# Patient Record
Sex: Male | Born: 1975 | Race: Black or African American | Hispanic: No
Health system: Southern US, Academic
[De-identification: ages and names within clinical notes are randomized; demographics above are authoritative.]

## PROBLEM LIST (undated history)

## (undated) ENCOUNTER — Telehealth

## (undated) ENCOUNTER — Ambulatory Visit

## (undated) ENCOUNTER — Encounter

## (undated) ENCOUNTER — Ambulatory Visit: Payer: MEDICARE | Attending: Surgery | Primary: Surgery

## (undated) ENCOUNTER — Ambulatory Visit: Payer: MEDICARE

## (undated) ENCOUNTER — Encounter: Attending: Nephrology | Primary: Nephrology

## (undated) ENCOUNTER — Ambulatory Visit: Payer: MEDICARE | Attending: Nephrology | Primary: Nephrology

## (undated) ENCOUNTER — Telehealth: Attending: Nephrology | Primary: Nephrology

## (undated) ENCOUNTER — Ambulatory Visit: Attending: Nephrology | Primary: Nephrology

## (undated) ENCOUNTER — Inpatient Hospital Stay

## (undated) ENCOUNTER — Telehealth: Attending: Cardiovascular Disease | Primary: Cardiovascular Disease

## (undated) DIAGNOSIS — I10 Essential (primary) hypertension: Secondary | ICD-10-CM

## (undated) DIAGNOSIS — N289 Disorder of kidney and ureter, unspecified: Secondary | ICD-10-CM

## (undated) DIAGNOSIS — E785 Hyperlipidemia, unspecified: Secondary | ICD-10-CM

## (undated) DIAGNOSIS — F32A Depression, unspecified: Secondary | ICD-10-CM

## (undated) DIAGNOSIS — K219 Gastro-esophageal reflux disease without esophagitis: Secondary | ICD-10-CM

## (undated) DIAGNOSIS — J45909 Unspecified asthma, uncomplicated: Secondary | ICD-10-CM

## (undated) DIAGNOSIS — Z8739 Personal history of other diseases of the musculoskeletal system and connective tissue: Secondary | ICD-10-CM

## (undated) DIAGNOSIS — F329 Major depressive disorder, single episode, unspecified: Secondary | ICD-10-CM

## (undated) DIAGNOSIS — N19 Unspecified kidney failure: Secondary | ICD-10-CM

## (undated) DIAGNOSIS — I4891 Unspecified atrial fibrillation: Secondary | ICD-10-CM

## (undated) HISTORY — DX: Hyperlipidemia, unspecified: E78.5

## (undated) HISTORY — DX: Major depressive disorder, single episode, unspecified: F32.9

## (undated) HISTORY — DX: Gastro-esophageal reflux disease without esophagitis: K21.9

## (undated) HISTORY — DX: Unspecified kidney failure: N19

## (undated) HISTORY — DX: Disorder of kidney and ureter, unspecified: N28.9

## (undated) HISTORY — DX: Essential (primary) hypertension: I10

## (undated) HISTORY — DX: Depression, unspecified: F32.A

## (undated) HISTORY — DX: Unspecified asthma, uncomplicated: J45.909

## (undated) HISTORY — DX: Personal history of other diseases of the musculoskeletal system and connective tissue: Z87.39

---
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
## 1999-12-12 ENCOUNTER — Emergency Department (HOSPITAL_COMMUNITY): Admission: EM | Admit: 1999-12-12 | Discharge: 1999-12-12 | Payer: Self-pay | Admitting: Emergency Medicine

## 2000-02-21 ENCOUNTER — Emergency Department (HOSPITAL_COMMUNITY): Admission: EM | Admit: 2000-02-21 | Discharge: 2000-02-21 | Payer: Self-pay | Admitting: *Deleted

## 2005-11-09 ENCOUNTER — Ambulatory Visit: Payer: Self-pay | Admitting: Internal Medicine

## 2007-04-10 ENCOUNTER — Emergency Department: Payer: Self-pay | Admitting: Unknown Physician Specialty

## 2008-09-20 HISTORY — PX: DIALYSIS FISTULA CREATION: SHX611

## 2008-11-24 ENCOUNTER — Emergency Department: Payer: Self-pay | Admitting: Emergency Medicine

## 2008-11-25 ENCOUNTER — Emergency Department: Payer: Self-pay | Admitting: Emergency Medicine

## 2008-11-27 ENCOUNTER — Emergency Department: Payer: Self-pay | Admitting: Emergency Medicine

## 2008-11-29 ENCOUNTER — Ambulatory Visit: Payer: Self-pay

## 2008-12-20 ENCOUNTER — Emergency Department: Payer: Self-pay | Admitting: Emergency Medicine

## 2011-07-14 ENCOUNTER — Other Ambulatory Visit: Payer: Self-pay | Admitting: Nephrology

## 2012-04-20 ENCOUNTER — Ambulatory Visit: Payer: Self-pay | Admitting: Internal Medicine

## 2012-04-20 LAB — TSH: Thyroid Stimulating Horm: 0.654 u[IU]/mL

## 2012-04-21 ENCOUNTER — Other Ambulatory Visit: Payer: Self-pay | Admitting: Internal Medicine

## 2012-04-24 LAB — CULTURE, BLOOD (SINGLE)

## 2012-04-25 LAB — CULTURE, BLOOD (SINGLE)

## 2012-05-04 ENCOUNTER — Ambulatory Visit: Payer: Self-pay | Admitting: Vascular Surgery

## 2012-10-26 ENCOUNTER — Ambulatory Visit: Payer: Self-pay | Admitting: Surgery

## 2012-10-26 LAB — BASIC METABOLIC PANEL
Chloride: 96 mmol/L — ABNORMAL LOW (ref 98–107)
Creatinine: 11.62 mg/dL — ABNORMAL HIGH (ref 0.60–1.30)
EGFR (African American): 6 — ABNORMAL LOW
Osmolality: 282 (ref 275–301)
Potassium: 4.5 mmol/L (ref 3.5–5.1)

## 2012-10-26 LAB — CBC
HGB: 13.8 g/dL (ref 13.0–18.0)
MCHC: 32.7 g/dL (ref 32.0–36.0)
MCV: 90 fL (ref 80–100)
RBC: 4.7 10*6/uL (ref 4.40–5.90)
RDW: 15.1 % — ABNORMAL HIGH (ref 11.5–14.5)
WBC: 6.8 10*3/uL (ref 3.8–10.6)

## 2012-10-31 ENCOUNTER — Ambulatory Visit: Payer: Self-pay | Admitting: Surgery

## 2012-11-01 LAB — PATHOLOGY REPORT

## 2012-12-12 ENCOUNTER — Emergency Department: Payer: Self-pay | Admitting: Emergency Medicine

## 2012-12-13 LAB — CBC
HGB: 12.4 g/dL — ABNORMAL LOW (ref 13.0–18.0)
MCH: 28.2 pg (ref 26.0–34.0)
MCHC: 32.1 g/dL (ref 32.0–36.0)
Platelet: 257 10*3/uL (ref 150–440)
RBC: 4.4 10*6/uL (ref 4.40–5.90)
WBC: 11.6 10*3/uL — ABNORMAL HIGH (ref 3.8–10.6)

## 2012-12-13 LAB — CK TOTAL AND CKMB (NOT AT ARMC): CK-MB: <0.5 ng/mL — ABNORMAL LOW (ref 0.5–3.6)

## 2012-12-13 LAB — BASIC METABOLIC PANEL
Anion Gap: 10 (ref 7–16)
Chloride: 94 mmol/L — ABNORMAL LOW (ref 98–107)
Co2: 26 mmol/L (ref 21–32)
Creatinine: 14.49 mg/dL — ABNORMAL HIGH (ref 0.60–1.30)
Glucose: 135 mg/dL — ABNORMAL HIGH (ref 65–99)
Osmolality: 282 (ref 275–301)
Potassium: 4.9 mmol/L (ref 3.5–5.1)
Sodium: 130 mmol/L — ABNORMAL LOW (ref 136–145)

## 2012-12-13 LAB — TROPONIN I: Troponin-I: <0.02 ng/mL

## 2012-12-18 ENCOUNTER — Ambulatory Visit (INDEPENDENT_AMBULATORY_CARE_PROVIDER_SITE_OTHER): Payer: Medicare Other | Admitting: Cardiovascular Disease

## 2012-12-18 ENCOUNTER — Encounter: Payer: Self-pay | Admitting: Cardiovascular Disease

## 2012-12-18 VITALS — BP 102/74 | HR 89 | Ht 73.0 in | Wt 219.2 lb

## 2012-12-18 DIAGNOSIS — R0602 Shortness of breath: Secondary | ICD-10-CM

## 2012-12-18 DIAGNOSIS — R002 Palpitations: Secondary | ICD-10-CM

## 2012-12-18 DIAGNOSIS — R079 Chest pain, unspecified: Secondary | ICD-10-CM

## 2012-12-18 NOTE — Assessment & Plan Note (Signed)
Shortness of breath is only happening in the setting of palpitations and reported tachycardia. He is not having any exertional symptoms. I will request his previous cardiac records. If his echocardiogram was more than 6 months ago, we might need to proceed with a repeat echocardiogram to make sure he has no pericardial effusion or other etiologies given that he is on dialysis.

## 2012-12-18 NOTE — Progress Notes (Signed)
HPI  This is a pleasant 37 year old African American male who was referred from the emergency room at [facility] for evaluation of palpitations and dyspnea. The patient is not aware of any previous cardiac history. He has known history of end-stage renal disease on hemodialysis on Monday Wednesday Friday. 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 He is currently on the kidney transplant list. He reports having previous cardiac workup including stress testing which has been unremarkable. His other medical problems include hyperlipidemia and tobacco use. He reports having bacteremia about 6 months ago which was treated with antibiotics. He saw Dr. Gwen Pounds for evaluation of possible endocarditis. He reports having an echocardiogram done at that time and was overall unremarkable. These records are not available. He also reports history of gout. He has been having intermittent episodes of palpitations and tachycardia associated with dyspnea. He had a prolonged episode last week and call EMS. However, by the time they arrived, he was feeling back to his normal self. He was evaluated in the emergency room and was noted to be in normal sinus rhythm. His labs were overall unremarkable. Cardiac enzymes were negative. Chest x-ray was normal. EKG was unremarkable. He has not had any recurrent palpitations since then. He exercises in a regular basis and has not experienced any recent chest pain or significant exertional dyspnea. His dyspnea seems to happen mainly when he feels fast heartbeats. He has been more stressed recently as he has a newborn at home.  No Known Allergies   No current outpatient prescriptions on file prior to visit.   No current facility-administered medications on file prior to visit.     Past Medical History  Diagnosis Date  . Kidney failure   . Depression   . Hyperlipidemia   . GERD (gastroesophageal reflux disease)   . Hx of gout   . Asthma   . Renal insufficiency   .  Hypertension      Past Surgical History  Procedure Laterality Date  . Dialysis fistula creation       Family History  Problem Relation Age of Onset  . Hypertension Father   . Hyperlipidemia Father   . Heart disease Father     CABG     History   Social History  . Marital Status: Single    Spouse Name: N/A    Number of Children: N/A  . Years of Education: N/A   Occupational History  . Not on file.   Social History Main Topics  . Smoking status: Current Some Day Smoker -- 0.50 packs/day for 7 years    Types: Cigarettes  . Smokeless tobacco: Not on file  . Alcohol Use: No  . Drug Use: Not on file     Comment: Smoked marijuana for about 10-12 years; quit x 5 years.  . Sexually Active: Not on file   Other Topics Concern  . Not on file   Social History Narrative  . No narrative on file     ROS Constitutional: Negative for fever, chills, diaphoresis, activity change, appetite change and fatigue.  HENT: Negative for hearing loss, nosebleeds, congestion, sore throat, facial swelling, drooling, trouble swallowing, neck pain, voice change, sinus pressure and tinnitus.  Eyes: Negative for photophobia, pain, discharge and visual disturbance.  Respiratory: Negative for apnea, cough, chest tightness and wheezing.  Cardiovascular: Negative for chest pain and leg swelling.  Gastrointestinal: Negative for nausea, vomiting, abdominal pain, diarrhea, constipation, blood in stool and abdominal distention.  Genitourinary: Negative for dysuria,  urgency, frequency, hematuria and decreased urine volume.  Musculoskeletal: Negative for myalgias, back pain, joint swelling, arthralgias and gait problem.  Skin: Negative for color change, pallor, rash and wound.  Neurological: Negative for dizziness, tremors, seizures, syncope, speech difficulty, weakness, light-headedness, numbness and headaches.  Psychiatric/Behavioral: Negative for suicidal ideas, hallucinations, behavioral problems and  agitation. The patient is not nervous/anxious.     PHYSICAL EXAM   BP 102/74  Pulse 89  Ht 6\' 1"  (1.854 m)  Wt 219 lb 4 oz (99.451 kg)  BMI 28.93 kg/m2 Constitutional: He is oriented to person, place, and time. He appears well-developed and well-nourished. No distress.  HENT: No nasal discharge.  Head: Normocephalic and atraumatic.  Eyes: Pupils are equal and round. Right eye exhibits no discharge. Left eye exhibits no discharge.  Neck: Normal range of motion. Neck supple. No JVD present. No thyromegaly present.  Cardiovascular: Normal rate, regular rhythm, normal heart sounds and. Exam reveals no gallop and no friction rub. No murmur heard.  Pulmonary/Chest: Effort normal and breath sounds normal. No stridor. No respiratory distress. He has no wheezes. He has no rales. He exhibits no tenderness.  Abdominal: Soft. Bowel sounds are normal. He exhibits no distension. There is no tenderness. There is no rebound and no guarding.  Musculoskeletal: Normal range of motion. He exhibits no edema and no tenderness.  Neurological: He is alert and oriented to person, place, and time. Coordination normal.  Skin: Skin is warm and dry. No rash noted. He is not diaphoretic. No erythema. No pallor.  Psychiatric: He has a normal mood and affect. His behavior is normal. Judgment and thought content normal.       EKG: Sinus  Rhythm  WITHIN NORMAL LIMITS   ASSESSMENT AND PLAN

## 2012-12-18 NOTE — Patient Instructions (Addendum)
Your physician has recommended that you wear an event monitor. Event monitors are medical devices that record the heart's electrical activity. Doctors most often [place] these monitors to diagnose arrhythmias. Arrhythmias are problems with the speed or rhythm of the heartbeat. The monitor is a small, portable device. You can wear one while you do your normal daily activities. This is usually used to diagnose what is causing palpitations/syncope (passing out).  We need cardiac records.   Follow up after monitor.

## 2012-12-18 NOTE — Assessment & Plan Note (Signed)
He is having intermittent palpitations which seems to be suggestive of tachyarrhythmia. However, this has not been captured by testing so for. His symptoms are not happening frequently enough to be captured with a Holter monitor. Thus, I recommend a 30 day outpatient telemetry.

## 2013-01-30 ENCOUNTER — Encounter: Payer: Self-pay | Admitting: Cardiovascular Disease

## 2013-01-30 ENCOUNTER — Ambulatory Visit (INDEPENDENT_AMBULATORY_CARE_PROVIDER_SITE_OTHER): Payer: Medicare Other | Admitting: Cardiovascular Disease

## 2013-01-30 VITALS — BP 122/82 | HR 88 | Ht 74.0 in | Wt 229.5 lb

## 2013-01-30 DIAGNOSIS — R000 Tachycardia, unspecified: Secondary | ICD-10-CM

## 2013-01-30 DIAGNOSIS — R002 Palpitations: Secondary | ICD-10-CM

## 2013-01-30 DIAGNOSIS — R0602 Shortness of breath: Secondary | ICD-10-CM

## 2013-01-30 NOTE — Progress Notes (Signed)
HPI  This is a pleasant 37 year old [place] American male who is here today for a followup visit regarding  palpitations and dyspnea. He has known history of end-stage renal disease on hemodialysis on Monday Wednesday Friday. 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 He is currently on the kidney transplant list.  He was seen for  intermittent episodes of palpitations and tachycardia associated with dyspnea. Basic workup in the emergency room was unremarkable. He underwent an outpatient telemetry which showed no evidence of arrhythmia other than sinus tachycardia. His symptoms overall improved. He denies any other symptoms. His blood pressure tends to run low especially on the day of dialysis.    No Known Allergies   Current Outpatient Prescriptions on File Prior to Visit  Medication Sig Dispense Refill  . albuterol (PROVENTIL) (2.5 MG/3ML) 0.083% nebulizer solution Take 2.5 mg by nebulization every 6 (six) hours as needed for wheezing.      Marland Kitchen aspirin 81 MG tablet Take 81 mg by mouth daily.      Marland Kitchen atorvastatin (LIPITOR) 20 MG tablet Take 20 mg by mouth daily.      . Cholecalciferol (VITAMIN D) 1000 UNITS capsule Take 1,000 Units by mouth daily.      . colchicine (COLCRYS) 0.6 MG tablet Take 0.6 mg by mouth daily.      Marland Kitchen esomeprazole (NEXIUM) 40 MG capsule Take 40 mg by mouth daily before breakfast.      . Omega-3 Fatty Acids (FISH OIL) 1000 MG CAPS Take 1,000 mg by mouth daily.      . sertraline (ZOLOFT) 25 MG tablet Take 25 mg by mouth daily.      . vitamin E 400 UNIT capsule Take 400 Units by mouth daily.       No current facility-administered medications on file prior to visit.     Past Medical History  Diagnosis Date  . Kidney failure   . Depression   . Hyperlipidemia   . GERD (gastroesophageal reflux disease)   . Hx of gout   . Asthma   . Renal insufficiency   . Hypertension      Past Surgical History  Procedure Laterality Date  . Dialysis fistula creation        Family History  Problem Relation Age of Onset  . Hypertension Father   . Hyperlipidemia Father   . Heart disease Father     CABG     History   Social History  . Marital Status: Single    Spouse Name: N/A    Number of Children: N/A  . Years of Education: N/A   Occupational History  . Not on file.   Social History Main Topics  . Smoking status: Current Some Day Smoker -- 0.50 packs/day for 7 years    Types: Cigarettes  . Smokeless tobacco: Not on file  . Alcohol Use: No  . Drug Use: Not on file     Comment: Smoked marijuana for about 10-12 years; quit x 5 years.  . Sexually Active: Not on file   Other Topics Concern  . Not on file   Social History Narrative  . No narrative on file     PHYSICAL EXAM   BP 122/82  Pulse 88  Ht 6\' 2"  (1.88 m)  Wt 229 lb 8 oz (104.101 kg)  BMI 29.45 kg/m2 Constitutional: He is oriented to person, place, and time. He appears well-developed and well-nourished. No distress.  HENT: No nasal discharge.  Head: Normocephalic and atraumatic.  Eyes: Pupils are equal and round. Right eye exhibits no discharge. Left eye exhibits no discharge.  Neck: Normal range of motion. Neck supple. No JVD present. No thyromegaly present.  Cardiovascular: Normal rate, regular rhythm, normal heart sounds and. Exam reveals no gallop and no friction rub. No murmur heard.  Pulmonary/Chest: Effort normal and breath sounds normal. No stridor. No respiratory distress. He has no wheezes. He has no rales. He exhibits no tenderness.  Abdominal: Soft. Bowel sounds are normal. He exhibits no distension. There is no tenderness. There is no rebound and no guarding.  Musculoskeletal: Normal range of motion. He exhibits no edema and no tenderness.  Neurological: He is alert and oriented to person, place, and time. Coordination normal.  Skin: Skin is warm and dry. No rash noted. He is not diaphoretic. No erythema. No pallor.  Psychiatric: He has a normal mood and  affect. His behavior is normal. Judgment and thought content normal.       EKG: Sinus  Rhythm  WITHIN NORMAL LIMITS   ASSESSMENT AND PLAN

## 2013-01-30 NOTE — Assessment & Plan Note (Signed)
Outpatient telemetry showed only sinus tachycardia with no evidence of arrhythmia. Continue observation for now. I reviewed his echocardiogram report that was done at [facility] last year. Over all it was unremarkable with normal LV systolic function and no evidence of valvular abnormalities. He is to followup as needed.

## 2013-01-30 NOTE — Patient Instructions (Addendum)
Follow up as needed

## 2013-02-01 ENCOUNTER — Other Ambulatory Visit: Payer: Self-pay

## 2013-02-01 ENCOUNTER — Ambulatory Visit (INDEPENDENT_AMBULATORY_CARE_PROVIDER_SITE_OTHER): Payer: Medicare Other

## 2013-02-01 DIAGNOSIS — R002 Palpitations: Secondary | ICD-10-CM

## 2013-04-06 ENCOUNTER — Emergency Department: Payer: Self-pay | Admitting: Emergency Medicine

## 2013-04-06 LAB — COMPREHENSIVE METABOLIC PANEL
Albumin: 3.8 g/dL (ref 3.4–5.0)
Anion Gap: 11 (ref 7–16)
BUN: 28 mg/dL — ABNORMAL HIGH (ref 7–18)
Bilirubin,Total: 0.6 mg/dL (ref 0.2–1.0)
Calcium, Total: 9.8 mg/dL (ref 8.5–10.1)
Chloride: 93 mmol/L — ABNORMAL LOW (ref 98–107)
Co2: 28 mmol/L (ref 21–32)
Creatinine: 9.67 mg/dL — ABNORMAL HIGH (ref 0.60–1.30)
EGFR (Non-African Amer.): 6 — ABNORMAL LOW
Glucose: 138 mg/dL — ABNORMAL HIGH (ref 65–99)
Osmolality: 272 (ref 275–301)
Potassium: 4.6 mmol/L (ref 3.5–5.1)
SGOT(AST): 38 U/L — ABNORMAL HIGH (ref 15–37)
SGPT (ALT): 47 U/L (ref 12–78)
Sodium: 132 mmol/L — ABNORMAL LOW (ref 136–145)
Total Protein: 8.1 g/dL (ref 6.4–8.2)

## 2013-04-06 LAB — CBC
HCT: 41.5 % (ref 40.0–52.0)
HGB: 13.6 g/dL (ref 13.0–18.0)
MCH: 28.7 pg (ref 26.0–34.0)
MCHC: 32.7 g/dL (ref 32.0–36.0)
MCV: 88 fL (ref 80–100)
Platelet: 190 10*3/uL (ref 150–440)
RDW: 18.1 % — ABNORMAL HIGH (ref 11.5–14.5)

## 2013-04-06 LAB — TROPONIN I
Troponin-I: <0.02 ng/mL
Troponin-I: <0.02 ng/mL

## 2013-04-06 LAB — DIFFERENTIAL
Lymphocytes: 19 %
Monocytes: 12 %
Variant Lymphocyte - H1-Rlymph: 3 %

## 2013-04-06 LAB — MAGNESIUM: Magnesium: 1.3 mg/dL — ABNORMAL LOW

## 2013-04-11 ENCOUNTER — Emergency Department: Payer: Self-pay | Admitting: Emergency Medicine

## 2013-04-11 LAB — CBC WITH DIFFERENTIAL/PLATELET
Basophil #: 0 10*3/uL (ref 0.0–0.1)
Eosinophil #: 0.1 10*3/uL (ref 0.0–0.7)
Eosinophil %: 1.1 %
HCT: 39.1 % — ABNORMAL LOW (ref 40.0–52.0)
HGB: 12.5 g/dL — ABNORMAL LOW (ref 13.0–18.0)
Lymphocyte %: 14.1 %
MCHC: 32 g/dL (ref 32.0–36.0)
Monocyte #: 0.6 x10 3/mm (ref 0.2–1.0)
Neutrophil %: 73.1 %
RBC: 4.5 10*6/uL (ref 4.40–5.90)
RDW: 18.4 % — ABNORMAL HIGH (ref 11.5–14.5)

## 2013-04-11 LAB — COMPREHENSIVE METABOLIC PANEL
Alkaline Phosphatase: 160 U/L — ABNORMAL HIGH (ref 50–136)
BUN: 28 mg/dL — ABNORMAL HIGH (ref 7–18)
Bilirubin,Total: 0.7 mg/dL (ref 0.2–1.0)
Chloride: 94 mmol/L — ABNORMAL LOW (ref 98–107)
Co2: 30 mmol/L (ref 21–32)
EGFR (African American): 7 — ABNORMAL LOW
Glucose: 97 mg/dL (ref 65–99)
Sodium: 132 mmol/L — ABNORMAL LOW (ref 136–145)
Total Protein: 7.9 g/dL (ref 6.4–8.2)

## 2013-04-11 LAB — MAGNESIUM: Magnesium: 1.4 mg/dL — ABNORMAL LOW

## 2013-04-11 LAB — CULTURE, BLOOD (SINGLE)

## 2013-12-10 IMAGING — CR DG CHEST 2V
1 series · 2 of 2 positions shown · non-contrast
Comparison: none

REASON FOR EXAM: Chest Pain
COMMENTS:

PROCEDURE:     DXR - DXR CHEST PA (OR AP) AND LATERAL  - December 13, 2012 [DATE]
RESULT:     The lungs are clear. The cardiac silhouette and visualized bony
skeleton are unremarkable.

[Series 1: w chest pa · 0.14mm/px · 2 of 2 slices shown]
[im 1/2]
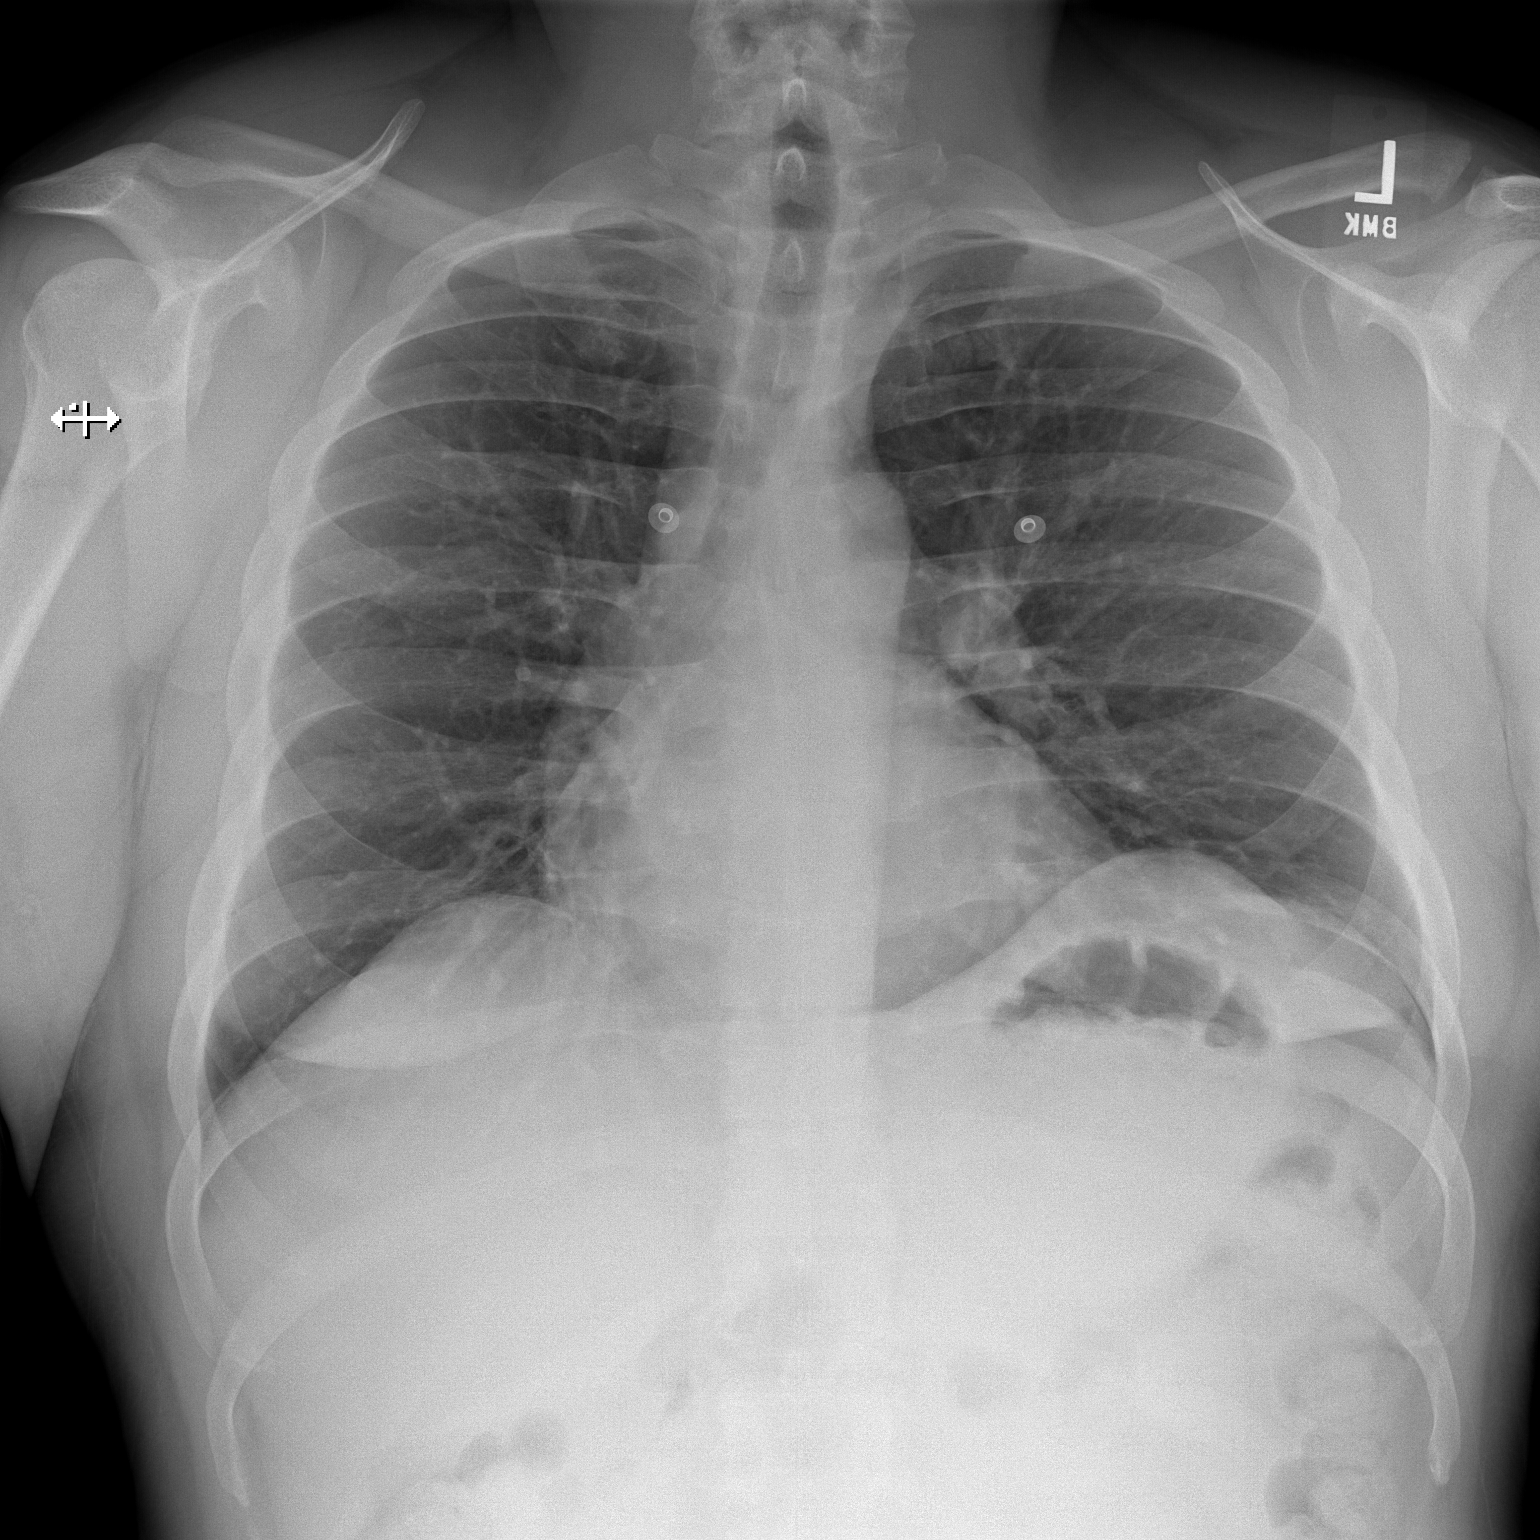
[im 2/2]
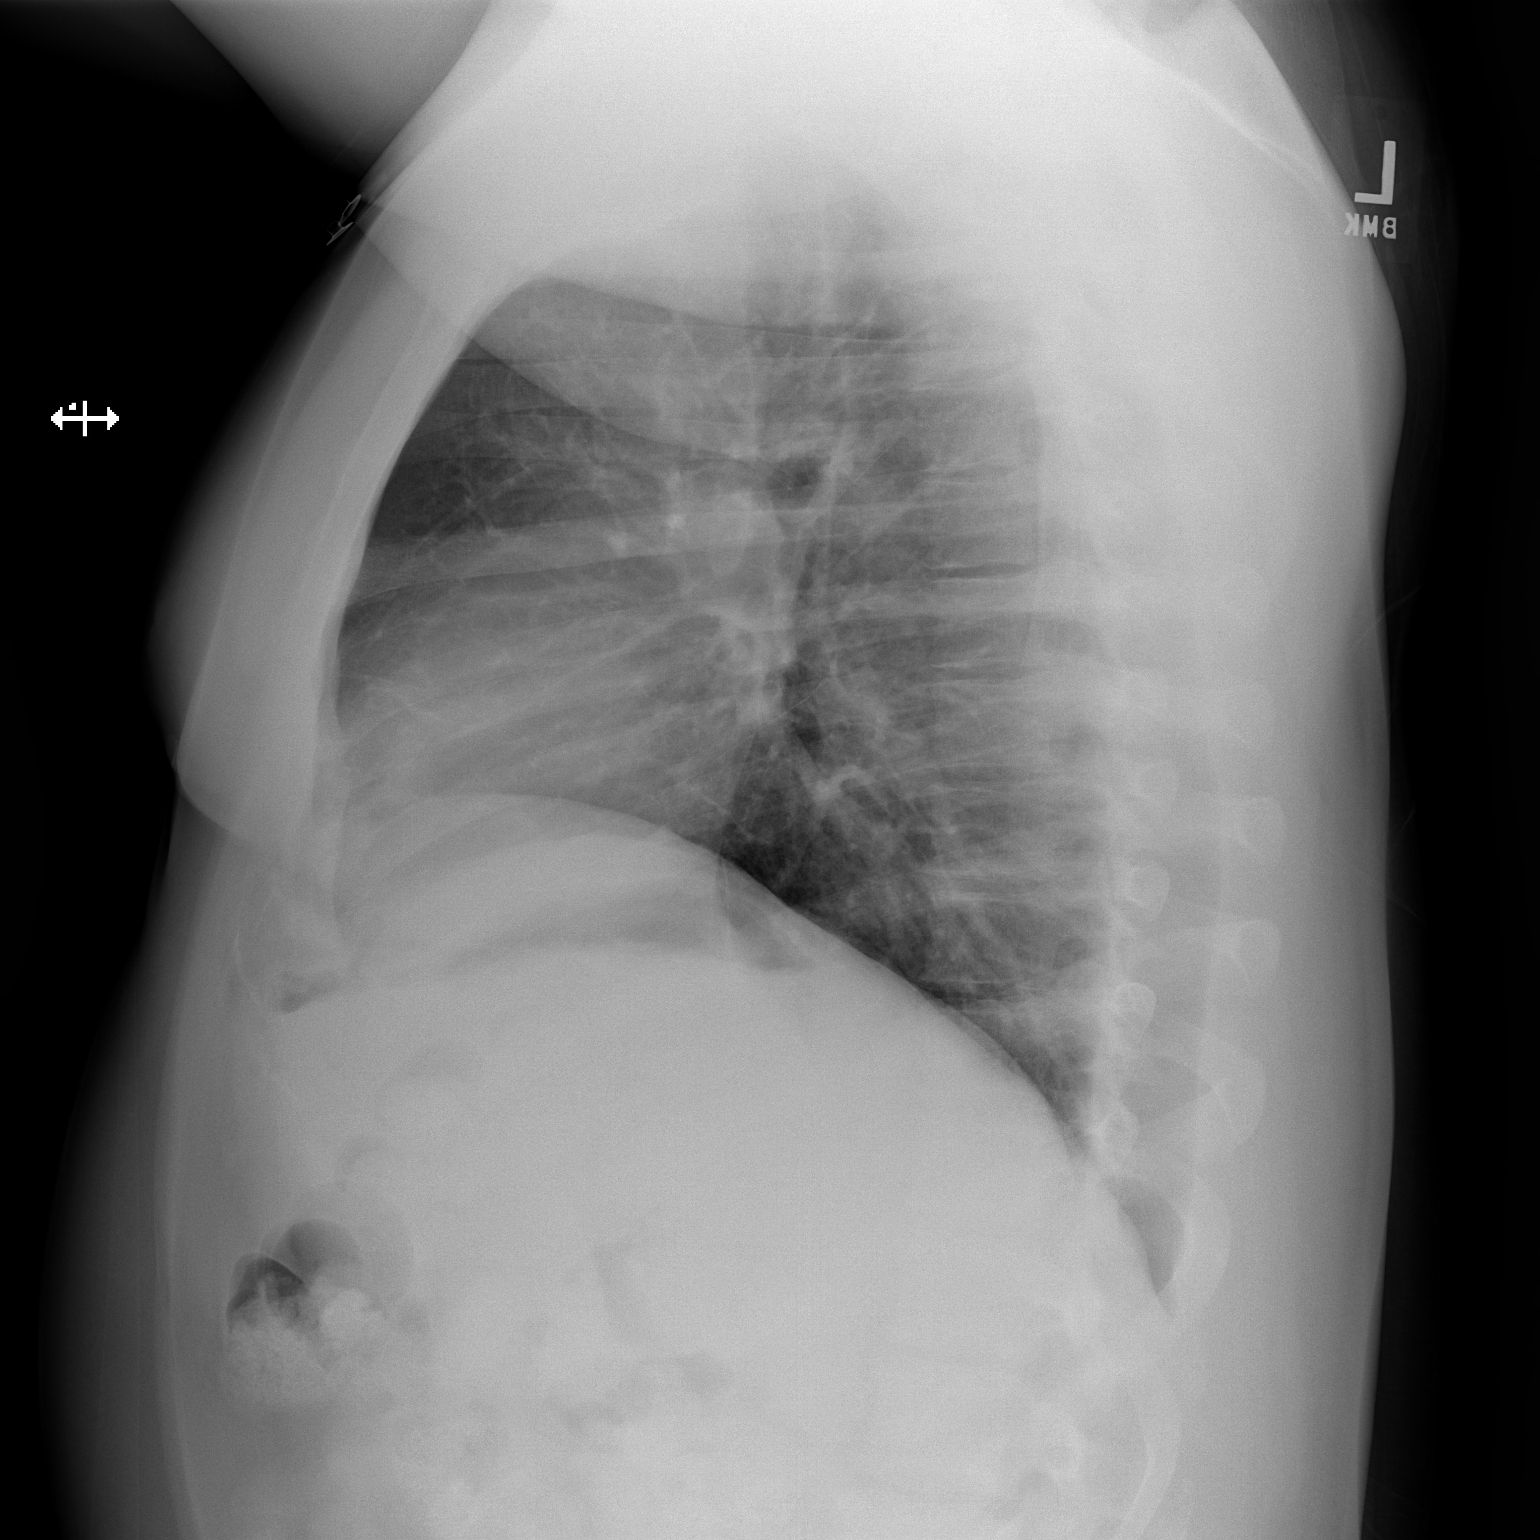

[2 of 2 positions shown; findings below may reference images not displayed]

IMPRESSION: 1. Chest radiograph without evidence of acute cardiopulmonary disease.

## 2014-04-03 IMAGING — CR DG CHEST 1V PORT
1 series · 1 of 1 positions shown · non-contrast
Comparison: none

REASON FOR EXAM: weak
COMMENTS:

PROCEDURE:     DXR - DXR PORTABLE CHEST SINGLE VIEW  - April 06, 2013  [DATE]
RESULT:     Comparison: None

[ap]
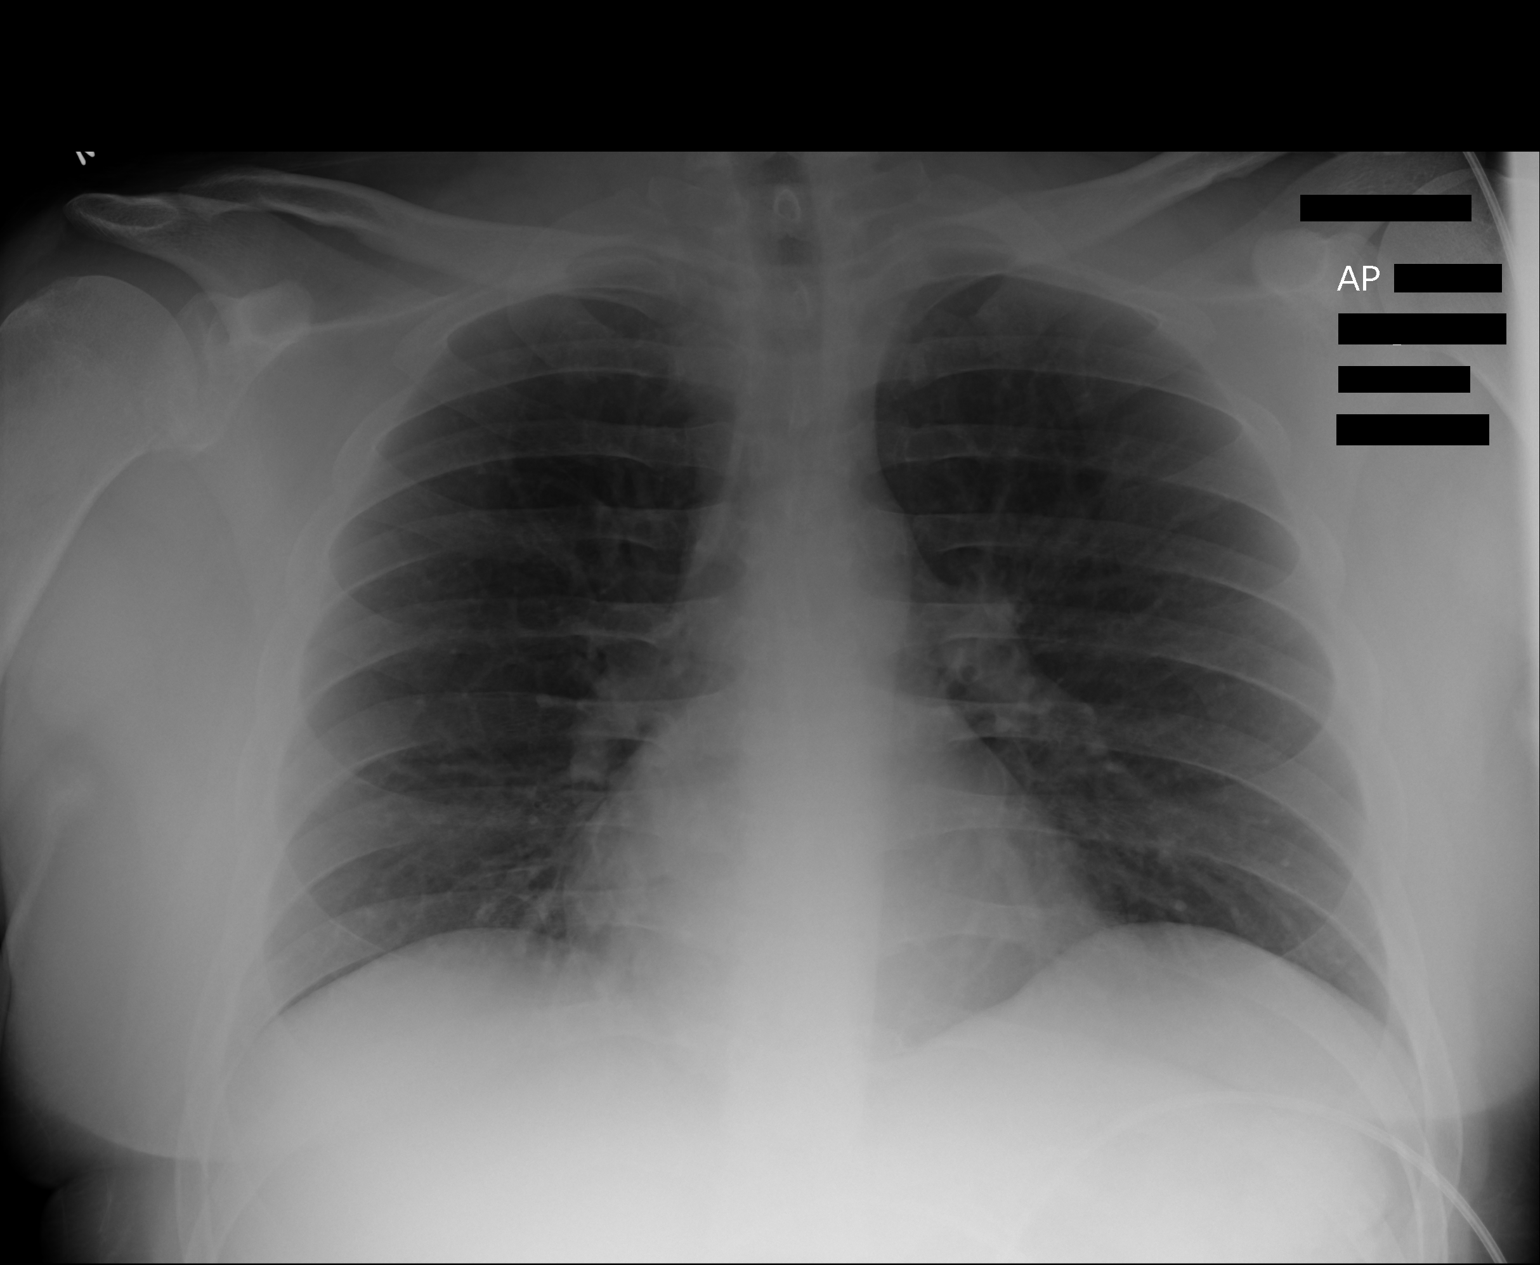

[1 of 1 positions shown; findings below may reference images not displayed]

FINDINGS: Single portable AP chest radiograph is provided.  There is no focal
parenchymal opacity, pleural effusion, or pneumothorax. Normal
cardiomediastinal silhouette. The osseous structures are unremarkable.
IMPRESSION: No acute disease of the che[REDACTED]

## 2014-05-23 HISTORY — PX: KIDNEY TRANSPLANT: SHX239

## 2014-05-29 ENCOUNTER — Other Ambulatory Visit: Payer: Self-pay | Admitting: Nephrology

## 2014-05-29 LAB — COMPREHENSIVE METABOLIC PANEL
ALT: 20 U/L
Albumin: 3.2 g/dL — ABNORMAL LOW (ref 3.4–5.0)
Alkaline Phosphatase: 153 U/L — ABNORMAL HIGH
Anion Gap: 5 — ABNORMAL LOW (ref 7–16)
BILIRUBIN TOTAL: 0.8 mg/dL (ref 0.2–1.0)
BUN: 42 mg/dL — AB (ref 7–18)
CALCIUM: 8.3 mg/dL — AB (ref 8.5–10.1)
CO2: 25 mmol/L (ref 21–32)
CREATININE: 3.38 mg/dL — AB (ref 0.60–1.30)
Chloride: 112 mmol/L — ABNORMAL HIGH (ref 98–107)
EGFR (Non-African Amer.): 22 — ABNORMAL LOW
GFR CALC AF AMER: 25 — AB
GLUCOSE: 81 mg/dL (ref 65–99)
Osmolality: 293 (ref 275–301)
POTASSIUM: 4.3 mmol/L (ref 3.5–5.1)
SGOT(AST): 27 U/L (ref 15–37)
Sodium: 142 mmol/L (ref 136–145)
Total Protein: 6.4 g/dL (ref 6.4–8.2)

## 2014-05-29 LAB — CBC WITH DIFFERENTIAL/PLATELET
Basophil #: 0 10*3/uL (ref 0.0–0.1)
Basophil %: 0.8 %
EOS ABS: 0.3 10*3/uL (ref 0.0–0.7)
EOS PCT: 6 %
HCT: 34 % — ABNORMAL LOW (ref 40.0–52.0)
HGB: 10.9 g/dL — ABNORMAL LOW (ref 13.0–18.0)
Lymphocyte #: 0 10*3/uL — ABNORMAL LOW (ref 1.0–3.6)
Lymphocyte %: 0.4 %
MCH: 29.9 pg (ref 26.0–34.0)
MCHC: 31.9 g/dL — ABNORMAL LOW (ref 32.0–36.0)
MCV: 94 fL (ref 80–100)
MONO ABS: 0.4 x10 3/mm (ref 0.2–1.0)
Monocyte %: 7.5 %
NEUTROS ABS: 4.7 10*3/uL (ref 1.4–6.5)
Neutrophil %: 85.3 %
Platelet: 190 10*3/uL (ref 150–440)
RBC: 3.63 10*6/uL — AB (ref 4.40–5.90)
RDW: 14.3 % (ref 11.5–14.5)
WBC: 5.5 10*3/uL (ref 3.8–10.6)

## 2014-05-29 LAB — PHOSPHORUS: Phosphorus: 3.3 mg/dL (ref 2.5–4.9)

## 2014-05-29 LAB — GAMMA GT: GGT: 45 U/L (ref 5–85)

## 2014-05-29 LAB — LIPID PANEL
CHOLESTEROL: 97 mg/dL (ref 0–200)
HDL Cholesterol: 29 mg/dL — ABNORMAL LOW (ref 40–60)
Ldl Cholesterol, Calc: 46 mg/dL (ref 0–100)
TRIGLYCERIDES: 112 mg/dL (ref 0–200)
VLDL CHOLESTEROL, CALC: 22 mg/dL (ref 5–40)

## 2014-05-29 LAB — MAGNESIUM: MAGNESIUM: 2.2 mg/dL

## 2014-05-31 LAB — CBC WITH DIFFERENTIAL/PLATELET
Basophil #: 0 10*3/uL (ref 0.0–0.1)
Basophil %: 0.6 %
Eosinophil #: 0.4 10*3/uL (ref 0.0–0.7)
Eosinophil %: 5.5 %
HCT: 33.7 % — ABNORMAL LOW (ref 40.0–52.0)
HGB: 10.8 g/dL — AB (ref 13.0–18.0)
LYMPHS PCT: 0.6 %
Lymphocyte #: 0 10*3/uL — ABNORMAL LOW (ref 1.0–3.6)
MCH: 29.8 pg (ref 26.0–34.0)
MCHC: 31.9 g/dL — ABNORMAL LOW (ref 32.0–36.0)
MCV: 93 fL (ref 80–100)
MONO ABS: 0.5 x10 3/mm (ref 0.2–1.0)
Monocyte %: 7 %
Neutrophil #: 6.2 10*3/uL (ref 1.4–6.5)
Neutrophil %: 86.3 %
PLATELETS: 231 10*3/uL (ref 150–440)
RBC: 3.61 10*6/uL — ABNORMAL LOW (ref 4.40–5.90)
RDW: 14.8 % — ABNORMAL HIGH (ref 11.5–14.5)
WBC: 7.2 10*3/uL (ref 3.8–10.6)

## 2014-05-31 LAB — COMPREHENSIVE METABOLIC PANEL
ALBUMIN: 3.2 g/dL — AB (ref 3.4–5.0)
ANION GAP: 10 (ref 7–16)
AST: 25 U/L (ref 15–37)
Alkaline Phosphatase: 197 U/L — ABNORMAL HIGH
BILIRUBIN TOTAL: 0.8 mg/dL (ref 0.2–1.0)
BUN: 32 mg/dL — ABNORMAL HIGH (ref 7–18)
CALCIUM: 8.1 mg/dL — AB (ref 8.5–10.1)
CHLORIDE: 110 mmol/L — AB (ref 98–107)
Co2: 22 mmol/L (ref 21–32)
Creatinine: 2.43 mg/dL — ABNORMAL HIGH (ref 0.60–1.30)
EGFR (Non-African Amer.): 32 — ABNORMAL LOW
GFR CALC AF AMER: 37 — AB
Glucose: 93 mg/dL (ref 65–99)
Osmolality: 290 (ref 275–301)
Potassium: 4.6 mmol/L (ref 3.5–5.1)
SGPT (ALT): 27 U/L
Sodium: 142 mmol/L (ref 136–145)
Total Protein: 6 g/dL — ABNORMAL LOW (ref 6.4–8.2)

## 2014-05-31 LAB — MAGNESIUM: MAGNESIUM: 1.8 mg/dL

## 2014-05-31 LAB — GAMMA GT: GGT: 82 U/L (ref 5–85)

## 2014-05-31 LAB — LIPID PANEL
CHOLESTEROL: 103 mg/dL (ref 0–200)
HDL Cholesterol: 28 mg/dL — ABNORMAL LOW (ref 40–60)
Ldl Cholesterol, Calc: 52 mg/dL (ref 0–100)
Triglycerides: 114 mg/dL (ref 0–200)
VLDL CHOLESTEROL, CALC: 23 mg/dL (ref 5–40)

## 2014-05-31 LAB — PHOSPHORUS: Phosphorus: 2.5 mg/dL (ref 2.5–4.9)

## 2014-06-06 LAB — CBC WITH DIFFERENTIAL/PLATELET
Basophil #: 0.1 10*3/uL (ref 0.0–0.1)
Basophil %: 1.2 %
EOS ABS: 0.2 10*3/uL (ref 0.0–0.7)
Eosinophil %: 3.8 %
HCT: 34.2 % — ABNORMAL LOW (ref 40.0–52.0)
HGB: 10.7 g/dL — ABNORMAL LOW (ref 13.0–18.0)
LYMPHS PCT: 1.3 %
Lymphocyte #: 0.1 10*3/uL — ABNORMAL LOW (ref 1.0–3.6)
MCH: 29.2 pg (ref 26.0–34.0)
MCHC: 31.4 g/dL — AB (ref 32.0–36.0)
MCV: 93 fL (ref 80–100)
Monocyte #: 0.4 x10 3/mm (ref 0.2–1.0)
Monocyte %: 6.6 %
Neutrophil #: 5.3 10*3/uL (ref 1.4–6.5)
Neutrophil %: 87.1 %
Platelet: 305 10*3/uL (ref 150–440)
RBC: 3.68 10*6/uL — AB (ref 4.40–5.90)
RDW: 14.9 % — ABNORMAL HIGH (ref 11.5–14.5)
WBC: 6.1 10*3/uL (ref 3.8–10.6)

## 2014-06-06 LAB — COMPREHENSIVE METABOLIC PANEL
ALK PHOS: 211 U/L — AB
AST: 24 U/L (ref 15–37)
Albumin: 3.4 g/dL (ref 3.4–5.0)
Anion Gap: 8 (ref 7–16)
BILIRUBIN TOTAL: 0.5 mg/dL (ref 0.2–1.0)
BUN: 23 mg/dL — ABNORMAL HIGH (ref 7–18)
CHLORIDE: 111 mmol/L — AB (ref 98–107)
CO2: 22 mmol/L (ref 21–32)
CREATININE: 1.93 mg/dL — AB (ref 0.60–1.30)
Calcium, Total: 8.6 mg/dL (ref 8.5–10.1)
EGFR (African American): 49 — ABNORMAL LOW
GFR CALC NON AF AMER: 43 — AB
GLUCOSE: 80 mg/dL (ref 65–99)
Osmolality: 284 (ref 275–301)
Potassium: 4.5 mmol/L (ref 3.5–5.1)
SGPT (ALT): 25 U/L
Sodium: 141 mmol/L (ref 136–145)
Total Protein: 6.8 g/dL (ref 6.4–8.2)

## 2014-06-06 LAB — MAGNESIUM: MAGNESIUM: 1.4 mg/dL — AB

## 2014-06-06 LAB — PHOSPHORUS: Phosphorus: 3.2 mg/dL (ref 2.5–4.9)

## 2014-06-08 ENCOUNTER — Emergency Department: Payer: Self-pay | Admitting: Emergency Medicine

## 2014-06-08 LAB — CBC
HCT: 38.4 % — AB (ref 40.0–52.0)
HGB: 12.2 g/dL — ABNORMAL LOW (ref 13.0–18.0)
MCH: 29.8 pg (ref 26.0–34.0)
MCHC: 31.8 g/dL — AB (ref 32.0–36.0)
MCV: 94 fL (ref 80–100)
Platelet: 336 10*3/uL (ref 150–440)
RBC: 4.1 10*6/uL — ABNORMAL LOW (ref 4.40–5.90)
RDW: 14.9 % — AB (ref 11.5–14.5)
WBC: 7.5 10*3/uL (ref 3.8–10.6)

## 2014-06-08 LAB — URINALYSIS, COMPLETE
BACTERIA: NONE SEEN
Bilirubin,UR: NEGATIVE
GLUCOSE, UR: NEGATIVE mg/dL (ref 0–75)
KETONE: NEGATIVE
Leukocyte Esterase: NEGATIVE
Nitrite: NEGATIVE
PH: 6 (ref 4.5–8.0)
PROTEIN: NEGATIVE
RBC,UR: 7 /HPF (ref 0–5)
SQUAMOUS EPITHELIAL: NONE SEEN
Specific Gravity: 1.008 (ref 1.003–1.030)
WBC UR: 4 /HPF (ref 0–5)

## 2014-06-08 LAB — COMPREHENSIVE METABOLIC PANEL
ALT: 34 U/L
Albumin: 4.4 g/dL (ref 3.4–5.0)
Alkaline Phosphatase: 279 U/L — ABNORMAL HIGH
Anion Gap: 10 (ref 7–16)
BUN: 22 mg/dL — AB (ref 7–18)
Bilirubin,Total: 0.6 mg/dL (ref 0.2–1.0)
CALCIUM: 9.2 mg/dL (ref 8.5–10.1)
Chloride: 106 mmol/L (ref 98–107)
Co2: 24 mmol/L (ref 21–32)
Creatinine: 1.72 mg/dL — ABNORMAL HIGH (ref 0.60–1.30)
EGFR (Non-African Amer.): 49 — ABNORMAL LOW
GFR CALC AF AMER: 57 — AB
GLUCOSE: 97 mg/dL (ref 65–99)
Osmolality: 283 (ref 275–301)
Potassium: 4.2 mmol/L (ref 3.5–5.1)
SGOT(AST): 35 U/L (ref 15–37)
Sodium: 140 mmol/L (ref 136–145)
Total Protein: 8.3 g/dL — ABNORMAL HIGH (ref 6.4–8.2)

## 2014-06-08 LAB — TROPONIN I: Troponin-I: <0.02 ng/mL

## 2014-06-10 LAB — CBC WITH DIFFERENTIAL/PLATELET
Basophil #: 0.1 10*3/uL (ref 0.0–0.1)
Basophil %: 2.6 %
EOS ABS: 0.3 10*3/uL (ref 0.0–0.7)
Eosinophil %: 6.3 %
HCT: 38.3 % — ABNORMAL LOW (ref 40.0–52.0)
HGB: 11.9 g/dL — ABNORMAL LOW (ref 13.0–18.0)
LYMPHS PCT: 2.4 %
Lymphocyte #: 0.1 10*3/uL — ABNORMAL LOW (ref 1.0–3.6)
MCH: 29 pg (ref 26.0–34.0)
MCHC: 31 g/dL — ABNORMAL LOW (ref 32.0–36.0)
MCV: 94 fL (ref 80–100)
MONO ABS: 0.4 x10 3/mm (ref 0.2–1.0)
Monocyte %: 7.8 %
Neutrophil #: 4 10*3/uL (ref 1.4–6.5)
Neutrophil %: 80.9 %
Platelet: 347 10*3/uL (ref 150–440)
RBC: 4.09 10*6/uL — ABNORMAL LOW (ref 4.40–5.90)
RDW: 15.1 % — AB (ref 11.5–14.5)
WBC: 5 10*3/uL (ref 3.8–10.6)

## 2014-06-10 LAB — COMPREHENSIVE METABOLIC PANEL
ALT: 24 U/L
ANION GAP: 3 — AB (ref 7–16)
Albumin: 3.6 g/dL (ref 3.4–5.0)
Alkaline Phosphatase: 244 U/L — ABNORMAL HIGH
BUN: 24 mg/dL — AB (ref 7–18)
Bilirubin,Total: 0.5 mg/dL (ref 0.2–1.0)
Calcium, Total: 8.8 mg/dL (ref 8.5–10.1)
Chloride: 108 mmol/L — ABNORMAL HIGH (ref 98–107)
Co2: 27 mmol/L (ref 21–32)
Creatinine: 1.67 mg/dL — ABNORMAL HIGH (ref 0.60–1.30)
GFR CALC AF AMER: 59 — AB
GFR CALC NON AF AMER: 51 — AB
GLUCOSE: 92 mg/dL (ref 65–99)
OSMOLALITY: 279 (ref 275–301)
Potassium: 4.8 mmol/L (ref 3.5–5.1)
SGOT(AST): 11 U/L — ABNORMAL LOW (ref 15–37)
Sodium: 138 mmol/L (ref 136–145)
Total Protein: 7 g/dL (ref 6.4–8.2)

## 2014-06-10 LAB — MAGNESIUM: MAGNESIUM: 1.2 mg/dL — AB

## 2014-06-10 LAB — PHOSPHORUS: Phosphorus: 3 mg/dL (ref 2.5–4.9)

## 2014-06-13 LAB — COMPREHENSIVE METABOLIC PANEL
ANION GAP: 6 — AB (ref 7–16)
Albumin: 3.9 g/dL (ref 3.4–5.0)
Alkaline Phosphatase: 280 U/L — ABNORMAL HIGH
BILIRUBIN TOTAL: 0.5 mg/dL (ref 0.2–1.0)
BUN: 23 mg/dL — ABNORMAL HIGH (ref 7–18)
CALCIUM: 9.1 mg/dL (ref 8.5–10.1)
Chloride: 109 mmol/L — ABNORMAL HIGH (ref 98–107)
Co2: 25 mmol/L (ref 21–32)
Creatinine: 1.72 mg/dL — ABNORMAL HIGH (ref 0.60–1.30)
EGFR (Non-African Amer.): 47 — ABNORMAL LOW
GFR CALC AF AMER: 57 — AB
GLUCOSE: 108 mg/dL — AB (ref 65–99)
OSMOLALITY: 284 (ref 275–301)
POTASSIUM: 4.3 mmol/L (ref 3.5–5.1)
SGOT(AST): 17 U/L (ref 15–37)
SGPT (ALT): 24 U/L
Sodium: 140 mmol/L (ref 136–145)
Total Protein: 7.3 g/dL (ref 6.4–8.2)

## 2014-06-13 LAB — CBC WITH DIFFERENTIAL/PLATELET
Basophil #: 0.1 10*3/uL (ref 0.0–0.1)
Basophil %: 3.3 %
EOS ABS: 0.3 10*3/uL (ref 0.0–0.7)
Eosinophil %: 7.4 %
HCT: 40.2 % (ref 40.0–52.0)
HGB: 12.6 g/dL — AB (ref 13.0–18.0)
LYMPHS ABS: 0.2 10*3/uL — AB (ref 1.0–3.6)
LYMPHS PCT: 4 %
MCH: 29.1 pg (ref 26.0–34.0)
MCHC: 31.3 g/dL — ABNORMAL LOW (ref 32.0–36.0)
MCV: 93 fL (ref 80–100)
Monocyte #: 0.3 x10 3/mm (ref 0.2–1.0)
Monocyte %: 7.9 %
NEUTROS PCT: 77.4 %
Neutrophil #: 3.2 10*3/uL (ref 1.4–6.5)
PLATELETS: 365 10*3/uL (ref 150–440)
RBC: 4.33 10*6/uL — AB (ref 4.40–5.90)
RDW: 14.7 % — ABNORMAL HIGH (ref 11.5–14.5)
WBC: 4.1 10*3/uL (ref 3.8–10.6)

## 2014-06-13 LAB — CULTURE, BLOOD (SINGLE)

## 2014-06-13 LAB — PHOSPHORUS: PHOSPHORUS: 3 mg/dL (ref 2.5–4.9)

## 2014-06-13 LAB — MAGNESIUM: Magnesium: 1.5 mg/dL — ABNORMAL LOW

## 2014-06-20 ENCOUNTER — Encounter: Payer: Self-pay | Admitting: Nephrology

## 2014-06-24 LAB — CBC WITH DIFFERENTIAL/PLATELET
Basophil #: 0.1 10*3/uL (ref 0.0–0.1)
Basophil %: 1.1 %
Eosinophil #: 0.2 10*3/uL (ref 0.0–0.7)
Eosinophil %: 4.3 %
HCT: 41.4 % (ref 40.0–52.0)
HGB: 13 g/dL (ref 13.0–18.0)
Lymphocyte #: 0.2 10*3/uL — ABNORMAL LOW (ref 1.0–3.6)
Lymphocyte %: 4.9 %
MCH: 29.1 pg (ref 26.0–34.0)
MCHC: 31.4 g/dL — AB (ref 32.0–36.0)
MCV: 93 fL (ref 80–100)
MONOS PCT: 6.3 %
Monocyte #: 0.3 x10 3/mm (ref 0.2–1.0)
Neutrophil #: 4.2 10*3/uL (ref 1.4–6.5)
Neutrophil %: 83.4 %
Platelet: 317 10*3/uL (ref 150–440)
RBC: 4.47 10*6/uL (ref 4.40–5.90)
RDW: 14.5 % (ref 11.5–14.5)
WBC: 5 10*3/uL (ref 3.8–10.6)

## 2014-06-24 LAB — COMPREHENSIVE METABOLIC PANEL
ALBUMIN: 3.9 g/dL (ref 3.4–5.0)
ANION GAP: 2 — AB (ref 7–16)
AST: 17 U/L (ref 15–37)
Alkaline Phosphatase: 264 U/L — ABNORMAL HIGH
BILIRUBIN TOTAL: 0.5 mg/dL (ref 0.2–1.0)
BUN: 16 mg/dL (ref 7–18)
CALCIUM: 8.4 mg/dL — AB (ref 8.5–10.1)
CHLORIDE: 109 mmol/L — AB (ref 98–107)
Co2: 28 mmol/L (ref 21–32)
Creatinine: 1.76 mg/dL — ABNORMAL HIGH (ref 0.60–1.30)
GFR CALC AF AMER: 56 — AB
GFR CALC NON AF AMER: 46 — AB
Glucose: 133 mg/dL — ABNORMAL HIGH (ref 65–99)
Osmolality: 281 (ref 275–301)
Potassium: 4.5 mmol/L (ref 3.5–5.1)
SGPT (ALT): 32 U/L
Sodium: 139 mmol/L (ref 136–145)
Total Protein: 6.8 g/dL (ref 6.4–8.2)

## 2014-06-24 LAB — PHOSPHORUS: PHOSPHORUS: 3.1 mg/dL (ref 2.5–4.9)

## 2014-06-24 LAB — MAGNESIUM: Magnesium: 1 mg/dL — ABNORMAL LOW

## 2014-07-01 LAB — COMPREHENSIVE METABOLIC PANEL
ALK PHOS: 228 U/L — AB
ANION GAP: 7 (ref 7–16)
AST: 22 U/L (ref 15–37)
Albumin: 4 g/dL (ref 3.4–5.0)
BUN: 24 mg/dL — AB (ref 7–18)
Bilirubin,Total: 0.7 mg/dL (ref 0.2–1.0)
CO2: 25 mmol/L (ref 21–32)
CREATININE: 1.47 mg/dL — AB (ref 0.60–1.30)
Calcium, Total: 8.2 mg/dL — ABNORMAL LOW (ref 8.5–10.1)
Chloride: 109 mmol/L — ABNORMAL HIGH (ref 98–107)
EGFR (African American): >60
EGFR (Non-African Amer.): 57 — ABNORMAL LOW
Glucose: 103 mg/dL — ABNORMAL HIGH (ref 65–99)
Osmolality: 286 (ref 275–301)
POTASSIUM: 4.4 mmol/L (ref 3.5–5.1)
SGPT (ALT): 30 U/L
SODIUM: 141 mmol/L (ref 136–145)
Total Protein: 7.2 g/dL (ref 6.4–8.2)

## 2014-07-01 LAB — CBC WITH DIFFERENTIAL/PLATELET
BASOS ABS: 0 10*3/uL (ref 0.0–0.1)
Basophil %: 0.7 %
Eosinophil #: 0.2 10*3/uL (ref 0.0–0.7)
Eosinophil %: 3.7 %
HCT: 42 % (ref 40.0–52.0)
HGB: 13.3 g/dL (ref 13.0–18.0)
LYMPHS ABS: 0.3 10*3/uL — AB (ref 1.0–3.6)
Lymphocyte %: 5 %
MCH: 29.2 pg (ref 26.0–34.0)
MCHC: 31.6 g/dL — ABNORMAL LOW (ref 32.0–36.0)
MCV: 92 fL (ref 80–100)
Monocyte #: 0.4 x10 3/mm (ref 0.2–1.0)
Monocyte %: 7 %
NEUTROS PCT: 83.6 %
Neutrophil #: 4.4 10*3/uL (ref 1.4–6.5)
PLATELETS: 245 10*3/uL (ref 150–440)
RBC: 4.54 10*6/uL (ref 4.40–5.90)
RDW: 14.4 % (ref 11.5–14.5)
WBC: 5.2 10*3/uL (ref 3.8–10.6)

## 2014-07-01 LAB — MAGNESIUM: MAGNESIUM: 1.5 mg/dL — AB

## 2014-07-01 LAB — PHOSPHORUS: Phosphorus: 3.5 mg/dL (ref 2.5–4.9)

## 2014-07-04 LAB — COMPREHENSIVE METABOLIC PANEL
ALBUMIN: 3.8 g/dL (ref 3.4–5.0)
ALK PHOS: 221 U/L — AB
ANION GAP: 4 — AB (ref 7–16)
BILIRUBIN TOTAL: 0.5 mg/dL (ref 0.2–1.0)
BUN: 19 mg/dL — AB (ref 7–18)
CHLORIDE: 111 mmol/L — AB (ref 98–107)
Calcium, Total: 8.3 mg/dL — ABNORMAL LOW (ref 8.5–10.1)
Co2: 27 mmol/L (ref 21–32)
Creatinine: 1.53 mg/dL — ABNORMAL HIGH (ref 0.60–1.30)
EGFR (Non-African Amer.): 54 — ABNORMAL LOW
GLUCOSE: 97 mg/dL (ref 65–99)
OSMOLALITY: 285 (ref 275–301)
POTASSIUM: 4.2 mmol/L (ref 3.5–5.1)
SGOT(AST): 16 U/L (ref 15–37)
SGPT (ALT): 26 U/L
SODIUM: 142 mmol/L (ref 136–145)
Total Protein: 6.7 g/dL (ref 6.4–8.2)

## 2014-07-04 LAB — CBC WITH DIFFERENTIAL/PLATELET
BASOS ABS: 0 10*3/uL (ref 0.0–0.1)
BASOS PCT: 0.6 %
Eosinophil #: 0.2 10*3/uL (ref 0.0–0.7)
Eosinophil %: 4.1 %
HCT: 39.9 % — AB (ref 40.0–52.0)
HGB: 12.6 g/dL — ABNORMAL LOW (ref 13.0–18.0)
LYMPHS ABS: 0.2 10*3/uL — AB (ref 1.0–3.6)
Lymphocyte %: 4 %
MCH: 29.4 pg (ref 26.0–34.0)
MCHC: 31.6 g/dL — ABNORMAL LOW (ref 32.0–36.0)
MCV: 93 fL (ref 80–100)
MONO ABS: 0.3 x10 3/mm (ref 0.2–1.0)
Monocyte %: 5.1 %
NEUTROS PCT: 86.2 %
Neutrophil #: 4.8 10*3/uL (ref 1.4–6.5)
Platelet: 224 10*3/uL (ref 150–440)
RBC: 4.29 10*6/uL — ABNORMAL LOW (ref 4.40–5.90)
RDW: 14.8 % — ABNORMAL HIGH (ref 11.5–14.5)
WBC: 5.6 10*3/uL (ref 3.8–10.6)

## 2014-07-04 LAB — PHOSPHORUS: PHOSPHORUS: 3 mg/dL (ref 2.5–4.9)

## 2014-07-04 LAB — MAGNESIUM: Magnesium: 1.5 mg/dL — ABNORMAL LOW

## 2014-07-08 LAB — COMPREHENSIVE METABOLIC PANEL
ALT: 26 U/L
Albumin: 3.9 g/dL (ref 3.4–5.0)
Alkaline Phosphatase: 195 U/L — ABNORMAL HIGH
Anion Gap: 6 — ABNORMAL LOW (ref 7–16)
BUN: 15 mg/dL (ref 7–18)
Bilirubin,Total: 0.8 mg/dL (ref 0.2–1.0)
CALCIUM: 8.1 mg/dL — AB (ref 8.5–10.1)
CHLORIDE: 109 mmol/L — AB (ref 98–107)
CO2: 26 mmol/L (ref 21–32)
Creatinine: 1.6 mg/dL — ABNORMAL HIGH (ref 0.60–1.30)
EGFR (Non-African Amer.): 51 — ABNORMAL LOW
GLUCOSE: 108 mg/dL — AB (ref 65–99)
Osmolality: 283 (ref 275–301)
Potassium: 4.5 mmol/L (ref 3.5–5.1)
SGOT(AST): 16 U/L (ref 15–37)
SODIUM: 141 mmol/L (ref 136–145)
TOTAL PROTEIN: 7.1 g/dL (ref 6.4–8.2)

## 2014-07-08 LAB — CBC WITH DIFFERENTIAL/PLATELET
Basophil #: 0 10*3/uL (ref 0.0–0.1)
Basophil %: 0.9 %
EOS ABS: 0.2 10*3/uL (ref 0.0–0.7)
EOS PCT: 4.2 %
HCT: 41.1 % (ref 40.0–52.0)
HGB: 13.1 g/dL (ref 13.0–18.0)
LYMPHS PCT: 5.3 %
Lymphocyte #: 0.2 10*3/uL — ABNORMAL LOW (ref 1.0–3.6)
MCH: 29.3 pg (ref 26.0–34.0)
MCHC: 31.7 g/dL — AB (ref 32.0–36.0)
MCV: 92 fL (ref 80–100)
Monocyte #: 0.3 x10 3/mm (ref 0.2–1.0)
Monocyte %: 6.1 %
Neutrophil #: 3.7 10*3/uL (ref 1.4–6.5)
Neutrophil %: 83.5 %
PLATELETS: 210 10*3/uL (ref 150–440)
RBC: 4.45 10*6/uL (ref 4.40–5.90)
RDW: 14.6 % — ABNORMAL HIGH (ref 11.5–14.5)
WBC: 4.4 10*3/uL (ref 3.8–10.6)

## 2014-07-08 LAB — PHOSPHORUS: Phosphorus: 3.3 mg/dL (ref 2.5–4.9)

## 2014-07-08 LAB — MAGNESIUM: MAGNESIUM: 1.5 mg/dL — AB

## 2014-07-11 LAB — BASIC METABOLIC PANEL
ANION GAP: 6 — AB (ref 7–16)
BUN: 23 mg/dL — AB (ref 7–18)
CHLORIDE: 108 mmol/L — AB (ref 98–107)
CO2: 27 mmol/L (ref 21–32)
Calcium, Total: 8.2 mg/dL — ABNORMAL LOW (ref 8.5–10.1)
Creatinine: 1.6 mg/dL — ABNORMAL HIGH (ref 0.60–1.30)
EGFR (African American): >60
EGFR (Non-African Amer.): 51 — ABNORMAL LOW
Glucose: 111 mg/dL — ABNORMAL HIGH (ref 65–99)
Osmolality: 286 (ref 275–301)
Potassium: 4.4 mmol/L (ref 3.5–5.1)
Sodium: 141 mmol/L (ref 136–145)

## 2014-07-11 LAB — PHOSPHORUS: Phosphorus: 3.6 mg/dL (ref 2.5–4.9)

## 2014-07-11 LAB — CBC WITH DIFFERENTIAL/PLATELET
BASOS ABS: 0 10*3/uL (ref 0.0–0.1)
Basophil %: 0.7 %
EOS PCT: 3.6 %
Eosinophil #: 0.2 10*3/uL (ref 0.0–0.7)
HCT: 42 % (ref 40.0–52.0)
HGB: 13.2 g/dL (ref 13.0–18.0)
LYMPHS ABS: 0.3 10*3/uL — AB (ref 1.0–3.6)
Lymphocyte %: 4.7 %
MCH: 29.2 pg (ref 26.0–34.0)
MCHC: 31.5 g/dL — ABNORMAL LOW (ref 32.0–36.0)
MCV: 93 fL (ref 80–100)
MONOS PCT: 6.9 %
Monocyte #: 0.4 x10 3/mm (ref 0.2–1.0)
NEUTROS ABS: 4.5 10*3/uL (ref 1.4–6.5)
Neutrophil %: 84.1 %
PLATELETS: 203 10*3/uL (ref 150–440)
RBC: 4.53 10*6/uL (ref 4.40–5.90)
RDW: 14.4 % (ref 11.5–14.5)
WBC: 5.3 10*3/uL (ref 3.8–10.6)

## 2014-07-11 LAB — MAGNESIUM: MAGNESIUM: 1.5 mg/dL — AB

## 2014-07-15 LAB — BASIC METABOLIC PANEL
Anion Gap: 4 — ABNORMAL LOW (ref 7–16)
BUN: 26 mg/dL — ABNORMAL HIGH (ref 7–18)
CHLORIDE: 111 mmol/L — AB (ref 98–107)
CREATININE: 1.61 mg/dL — AB (ref 0.60–1.30)
Calcium, Total: 8.3 mg/dL — ABNORMAL LOW (ref 8.5–10.1)
Co2: 27 mmol/L (ref 21–32)
GFR CALC NON AF AMER: 51 — AB
GLUCOSE: 106 mg/dL — AB (ref 65–99)
Osmolality: 288 (ref 275–301)
POTASSIUM: 4.5 mmol/L (ref 3.5–5.1)
Sodium: 142 mmol/L (ref 136–145)

## 2014-07-15 LAB — CBC WITH DIFFERENTIAL/PLATELET
Basophil #: 0 10*3/uL (ref 0.0–0.1)
Basophil %: 0.8 %
Eosinophil #: 0.2 10*3/uL (ref 0.0–0.7)
Eosinophil %: 3.9 %
HCT: 41.9 % (ref 40.0–52.0)
HGB: 13 g/dL (ref 13.0–18.0)
Lymphocyte #: 0.2 10*3/uL — ABNORMAL LOW (ref 1.0–3.6)
Lymphocyte %: 4.6 %
MCH: 28.9 pg (ref 26.0–34.0)
MCHC: 31.1 g/dL — ABNORMAL LOW (ref 32.0–36.0)
MCV: 93 fL (ref 80–100)
Monocyte #: 0.3 x10 3/mm (ref 0.2–1.0)
Monocyte %: 6.4 %
Neutrophil #: 4.1 10*3/uL (ref 1.4–6.5)
Neutrophil %: 84.3 %
Platelet: 211 10*3/uL (ref 150–440)
RBC: 4.51 10*6/uL (ref 4.40–5.90)
RDW: 14.6 % — ABNORMAL HIGH (ref 11.5–14.5)
WBC: 4.9 10*3/uL (ref 3.8–10.6)

## 2014-07-15 LAB — PHOSPHORUS: Phosphorus: 3.3 mg/dL (ref 2.5–4.9)

## 2014-07-15 LAB — MAGNESIUM: Magnesium: 1.4 mg/dL — ABNORMAL LOW

## 2014-07-21 ENCOUNTER — Encounter: Payer: Self-pay | Admitting: Nephrology

## 2014-07-22 LAB — CBC WITH DIFFERENTIAL/PLATELET
BASOS PCT: 0.7 %
Basophil #: 0 10*3/uL (ref 0.0–0.1)
Eosinophil #: 0.2 10*3/uL (ref 0.0–0.7)
Eosinophil %: 5.3 %
HCT: 41.7 % (ref 40.0–52.0)
HGB: 13.4 g/dL (ref 13.0–18.0)
LYMPHS ABS: 0.2 10*3/uL — AB (ref 1.0–3.6)
LYMPHS PCT: 5.2 %
MCH: 29.8 pg (ref 26.0–34.0)
MCHC: 32.1 g/dL (ref 32.0–36.0)
MCV: 93 fL (ref 80–100)
MONOS PCT: 6.6 %
Monocyte #: 0.3 x10 3/mm (ref 0.2–1.0)
Neutrophil #: 3.7 10*3/uL (ref 1.4–6.5)
Neutrophil %: 82.2 %
PLATELETS: 249 10*3/uL (ref 150–440)
RBC: 4.5 10*6/uL (ref 4.40–5.90)
RDW: 14.5 % (ref 11.5–14.5)
WBC: 4.5 10*3/uL (ref 3.8–10.6)

## 2014-07-22 LAB — BASIC METABOLIC PANEL
ANION GAP: 9 (ref 7–16)
BUN: 19 mg/dL — ABNORMAL HIGH (ref 7–18)
CHLORIDE: 107 mmol/L (ref 98–107)
CO2: 26 mmol/L (ref 21–32)
Calcium, Total: 8.5 mg/dL (ref 8.5–10.1)
Creatinine: 1.48 mg/dL — ABNORMAL HIGH (ref 0.60–1.30)
EGFR (Non-African Amer.): 56 — ABNORMAL LOW
Glucose: 102 mg/dL — ABNORMAL HIGH (ref 65–99)
Osmolality: 286 (ref 275–301)
Potassium: 4.7 mmol/L (ref 3.5–5.1)
Sodium: 142 mmol/L (ref 136–145)

## 2014-07-22 LAB — MAGNESIUM: Magnesium: 1.4 mg/dL — ABNORMAL LOW

## 2014-07-22 LAB — PHOSPHORUS: Phosphorus: 3.3 mg/dL (ref 2.5–4.9)

## 2014-07-29 LAB — CBC WITH DIFFERENTIAL/PLATELET
BASOS ABS: 0 10*3/uL (ref 0.0–0.1)
Basophil %: 0.8 %
EOS ABS: 0.2 10*3/uL (ref 0.0–0.7)
Eosinophil %: 4.6 %
HCT: 40.8 % (ref 40.0–52.0)
HGB: 13.2 g/dL (ref 13.0–18.0)
LYMPHS PCT: 5.6 %
Lymphocyte #: 0.3 10*3/uL — ABNORMAL LOW (ref 1.0–3.6)
MCH: 29.7 pg (ref 26.0–34.0)
MCHC: 32.4 g/dL (ref 32.0–36.0)
MCV: 92 fL (ref 80–100)
Monocyte #: 0.3 x10 3/mm (ref 0.2–1.0)
Monocyte %: 7 %
NEUTROS PCT: 82 %
Neutrophil #: 3.7 10*3/uL (ref 1.4–6.5)
Platelet: 253 10*3/uL (ref 150–440)
RBC: 4.46 10*6/uL (ref 4.40–5.90)
RDW: 14.4 % (ref 11.5–14.5)
WBC: 4.5 10*3/uL (ref 3.8–10.6)

## 2014-07-29 LAB — BASIC METABOLIC PANEL
Anion Gap: 6 — ABNORMAL LOW (ref 7–16)
BUN: 19 mg/dL — AB (ref 7–18)
CALCIUM: 9 mg/dL (ref 8.5–10.1)
CO2: 26 mmol/L (ref 21–32)
CREATININE: 1.59 mg/dL — AB (ref 0.60–1.30)
Chloride: 107 mmol/L (ref 98–107)
GFR CALC NON AF AMER: 52 — AB
GLUCOSE: 126 mg/dL — AB (ref 65–99)
Osmolality: 281 (ref 275–301)
POTASSIUM: 4.1 mmol/L (ref 3.5–5.1)
SODIUM: 139 mmol/L (ref 136–145)

## 2014-07-29 LAB — PHOSPHORUS: Phosphorus: 3.2 mg/dL (ref 2.5–4.9)

## 2014-07-29 LAB — MAGNESIUM: Magnesium: 1.2 mg/dL — ABNORMAL LOW

## 2014-08-05 LAB — COMPREHENSIVE METABOLIC PANEL
Albumin: 4.3 g/dL (ref 3.4–5.0)
Alkaline Phosphatase: 182 U/L — ABNORMAL HIGH
Anion Gap: 5 — ABNORMAL LOW (ref 7–16)
BUN: 21 mg/dL — ABNORMAL HIGH (ref 7–18)
Bilirubin,Total: 0.7 mg/dL (ref 0.2–1.0)
Calcium, Total: 8.7 mg/dL (ref 8.5–10.1)
Chloride: 113 mmol/L — ABNORMAL HIGH (ref 98–107)
Co2: 25 mmol/L (ref 21–32)
Creatinine: 1.51 mg/dL — ABNORMAL HIGH (ref 0.60–1.30)
EGFR (African American): >60
EGFR (Non-African Amer.): 55 — ABNORMAL LOW
Glucose: 109 mg/dL — ABNORMAL HIGH (ref 65–99)
Osmolality: 289 (ref 275–301)
Potassium: 4.4 mmol/L (ref 3.5–5.1)
SGOT(AST): 27 U/L (ref 15–37)
SGPT (ALT): 37 U/L
Sodium: 143 mmol/L (ref 136–145)
Total Protein: 7.6 g/dL (ref 6.4–8.2)

## 2014-08-05 LAB — CBC WITH DIFFERENTIAL/PLATELET
BASOS ABS: 0 10*3/uL (ref 0.0–0.1)
BASOS PCT: 0.6 %
EOS PCT: 4.4 %
Eosinophil #: 0.2 10*3/uL (ref 0.0–0.7)
HCT: 43.9 % (ref 40.0–52.0)
HGB: 14.3 g/dL (ref 13.0–18.0)
LYMPHS ABS: 0.3 10*3/uL — AB (ref 1.0–3.6)
LYMPHS PCT: 6.2 %
MCH: 29.8 pg (ref 26.0–34.0)
MCHC: 32.6 g/dL (ref 32.0–36.0)
MCV: 91 fL (ref 80–100)
Monocyte #: 0.4 x10 3/mm (ref 0.2–1.0)
Monocyte %: 7.2 %
NEUTROS ABS: 4.2 10*3/uL (ref 1.4–6.5)
Neutrophil %: 81.6 %
PLATELETS: 264 10*3/uL (ref 150–440)
RBC: 4.81 10*6/uL (ref 4.40–5.90)
RDW: 14.5 % (ref 11.5–14.5)
WBC: 5.2 10*3/uL (ref 3.8–10.6)

## 2014-08-05 LAB — PHOSPHORUS: Phosphorus: 3.5 mg/dL (ref 2.5–4.9)

## 2014-08-05 LAB — MAGNESIUM: Magnesium: 1.3 mg/dL — ABNORMAL LOW

## 2014-08-13 LAB — CBC WITH DIFFERENTIAL/PLATELET
Basophil #: 0 10*3/uL (ref 0.0–0.1)
Basophil %: 0.4 %
Eosinophil #: 0.2 10*3/uL (ref 0.0–0.7)
Eosinophil %: 4.4 %
HCT: 42.3 % (ref 40.0–52.0)
HGB: 13.6 g/dL (ref 13.0–18.0)
Lymphocyte #: 0.4 10*3/uL — ABNORMAL LOW (ref 1.0–3.6)
Lymphocyte %: 7.6 %
MCH: 29.4 pg (ref 26.0–34.0)
MCHC: 32 g/dL (ref 32.0–36.0)
MCV: 92 fL (ref 80–100)
Monocyte #: 0.4 x10 3/mm (ref 0.2–1.0)
Monocyte %: 7.8 %
Neutrophil #: 4.3 10*3/uL (ref 1.4–6.5)
Neutrophil %: 79.8 %
Platelet: 226 10*3/uL (ref 150–440)
RBC: 4.61 10*6/uL (ref 4.40–5.90)
RDW: 14.2 % (ref 11.5–14.5)
WBC: 5.4 10*3/uL (ref 3.8–10.6)

## 2014-08-13 LAB — BASIC METABOLIC PANEL
ANION GAP: 7 (ref 7–16)
BUN: 26 mg/dL — AB (ref 7–18)
CALCIUM: 8.8 mg/dL (ref 8.5–10.1)
CHLORIDE: 111 mmol/L — AB (ref 98–107)
CO2: 24 mmol/L (ref 21–32)
CREATININE: 1.53 mg/dL — AB (ref 0.60–1.30)
EGFR (African American): >60
GFR CALC NON AF AMER: 54 — AB
Glucose: 111 mg/dL — ABNORMAL HIGH (ref 65–99)
Osmolality: 289 (ref 275–301)
Potassium: 4.4 mmol/L (ref 3.5–5.1)
SODIUM: 142 mmol/L (ref 136–145)

## 2014-08-13 LAB — MAGNESIUM: Magnesium: 1.5 mg/dL — ABNORMAL LOW

## 2014-08-13 LAB — PHOSPHORUS: Phosphorus: 3 mg/dL (ref 2.5–4.9)

## 2014-08-19 ENCOUNTER — Encounter (HOSPITAL_COMMUNITY): Payer: Self-pay | Admitting: Emergency Medicine

## 2014-08-19 ENCOUNTER — Emergency Department (HOSPITAL_COMMUNITY): Payer: Medicare Other

## 2014-08-19 ENCOUNTER — Emergency Department (HOSPITAL_COMMUNITY)
Admission: EM | Admit: 2014-08-19 | Discharge: 2014-08-19 | Disposition: A | Discharge status: Home / Self Care | Payer: Medicare Other | Attending: Emergency Medicine | Admitting: Emergency Medicine

## 2014-08-19 DIAGNOSIS — K219 Gastro-esophageal reflux disease without esophagitis: Secondary | ICD-10-CM | POA: Diagnosis not present

## 2014-08-19 DIAGNOSIS — I10 Essential (primary) hypertension: Secondary | ICD-10-CM | POA: Diagnosis not present

## 2014-08-19 DIAGNOSIS — Z7982 Long term (current) use of aspirin: Secondary | ICD-10-CM | POA: Diagnosis not present

## 2014-08-19 DIAGNOSIS — R002 Palpitations: Secondary | ICD-10-CM | POA: Diagnosis not present

## 2014-08-19 DIAGNOSIS — Z79899 Other long term (current) drug therapy: Secondary | ICD-10-CM | POA: Diagnosis not present

## 2014-08-19 DIAGNOSIS — F329 Major depressive disorder, single episode, unspecified: Secondary | ICD-10-CM | POA: Diagnosis not present

## 2014-08-19 DIAGNOSIS — Z8739 Personal history of other diseases of the musculoskeletal system and connective tissue: Secondary | ICD-10-CM | POA: Insufficient documentation

## 2014-08-19 DIAGNOSIS — J45909 Unspecified asthma, uncomplicated: Secondary | ICD-10-CM | POA: Insufficient documentation

## 2014-08-19 DIAGNOSIS — Z87448 Personal history of other diseases of urinary system: Secondary | ICD-10-CM | POA: Insufficient documentation

## 2014-08-19 DIAGNOSIS — R079 Chest pain, unspecified: Secondary | ICD-10-CM | POA: Diagnosis present

## 2014-08-19 DIAGNOSIS — E785 Hyperlipidemia, unspecified: Secondary | ICD-10-CM | POA: Insufficient documentation

## 2014-08-19 DIAGNOSIS — R0602 Shortness of breath: Secondary | ICD-10-CM | POA: Insufficient documentation

## 2014-08-19 DIAGNOSIS — Z720 Tobacco use: Secondary | ICD-10-CM | POA: Insufficient documentation

## 2014-08-19 LAB — CBC
HCT: 38.3 % — ABNORMAL LOW (ref 39.0–52.0)
HEMOGLOBIN: 12.3 g/dL — AB (ref 13.0–17.0)
MCH: 28.6 pg (ref 26.0–34.0)
MCHC: 32.1 g/dL (ref 30.0–36.0)
MCV: 89.1 fL (ref 78.0–100.0)
Platelets: 203 10*3/uL (ref 150–400)
RBC: 4.3 MIL/uL (ref 4.22–5.81)
RDW: 13.7 % (ref 11.5–15.5)
WBC: 4.4 10*3/uL (ref 4.0–10.5)

## 2014-08-19 LAB — BASIC METABOLIC PANEL
Anion gap: 12 (ref 5–15)
BUN: 20 mg/dL (ref 6–23)
CO2: 23 mEq/L (ref 19–32)
CREATININE: 1.35 mg/dL (ref 0.50–1.35)
Calcium: 9.5 mg/dL (ref 8.4–10.5)
Chloride: 104 mEq/L (ref 96–112)
GFR calc non Af Amer: 64 mL/min — ABNORMAL LOW (ref >90)
GFR, EST AFRICAN AMERICAN: 75 mL/min — AB (ref >90)
Glucose, Bld: 111 mg/dL — ABNORMAL HIGH (ref 70–99)
Potassium: 4.5 mEq/L (ref 3.7–5.3)
Sodium: 139 mEq/L (ref 137–147)

## 2014-08-19 LAB — I-STAT TROPONIN, ED: Troponin i, poc: 0 ng/mL (ref 0.00–0.08)

## 2014-08-19 LAB — PRO B NATRIURETIC PEPTIDE: PRO B NATRI PEPTIDE: 96.9 pg/mL (ref 0–125)

## 2014-08-19 NOTE — ED Provider Notes (Addendum)
CSN: [id number]     Arrival date & time 08/19/14  1303 History   First MD Initiated Contact with Patient 08/19/14 1418     Chief Complaint  Patient presents with  . Chest Pain     (Consider location/radiation/quality/duration/timing/severity/associated sxs/prior Treatment) Patient is a 38 y.o. male presenting with chest pain. The history is provided by the patient.  Chest Pain Associated symptoms: palpitations   Associated symptoms: no abdominal pain, no back pain, no cough, no fever, no headache and not vomiting   pt with hx crf s/p renal txplt 9/15, atrial fibrillation, c/o sense palpitations earlier today. States felt fast and irregular, similar to prior afib.  States when was beating fast felt mildly tight in chest w mild sob.  Denies associated nv or diaphoresis. Denies hx cad. Denies any other recent palpitations or episodes of chest discomfort. No unusual doe or fatigue. States kidn transplant has been functioning normally, normal uo, and compliant w meds. Denies fever or chills. No cough or uri c/o. No leg pain or swelling. No recent travel, immobility, or surgery.  No pleuritic pain.  Denies fam hx premature cad.  States otherwise feels is normal, baseline, well state of health.  Symptoms lasted several minutes, and now completely resolved.         Past Medical History  Diagnosis Date  . Kidney failure   . Depression   . Hyperlipidemia   . GERD (gastroesophageal reflux disease)   . Hx of gout   . Asthma   . Renal insufficiency   . Hypertension    Past Surgical History  Procedure Laterality Date  . Dialysis fistula creation    . Kidney transpla nt Right    Family History  Problem Relation Age of Onset  . Hypertension Father   . Hyperlipidemia Father   . Heart disease Father     CABG   History  Substance Use Topics  . Smoking status: Current Some Day Smoker -- 0.50 packs/day for 7 years    Types: Cigarettes  . Smokeless tobacco: Not on file  . Alcohol Use: No     Review of Systems  Constitutional: Negative for fever and chills.  HENT: Negative for sore throat.   Eyes: Negative for visual disturbance.  Respiratory: Negative for cough.   Cardiovascular: Positive for chest pain and palpitations. Negative for leg swelling.  Gastrointestinal: Negative for vomiting, abdominal pain and diarrhea.  Genitourinary: Negative for dysuria and flank pain.  Musculoskeletal: Negative for back pain and neck pain.  Skin: Negative for rash.  Neurological: Negative for headaches.  Hematological: Does not bruise/bleed easily.  Psychiatric/Behavioral: Negative for confusion.      Allergies  Review of patient's allergies indicates no known allergies.  Home Medications   Prior to Admission medications   Medication Sig Start Date End Date Taking? Authorizing Provider  aspirin 81 MG tablet Take 81 mg by mouth daily.   Yes Historical Provider, MD  atorvastatin (LIPITOR) 20 MG tablet Take 20 mg by mouth daily.   Yes Historical Provider, MD  Cholecalciferol (VITAMIN D) 1000 UNITS capsule Take 1,000 Units by mouth daily.   Yes Historical Provider, MD  metoprolol tartrate (LOPRESSOR) 25 MG tablet Take 25 mg by mouth 2 (two) times daily.   Yes Historical Provider, MD  mycophenolate (MYFORTIC) 180 MG EC tablet Take 180 mg by mouth 2 (two) times daily. 08/14/14 08/14/15 Yes Historical Provider, MD  Omega-3 Fatty Acids (FISH OIL) 1000 MG CAPS Take 1,000 mg by mouth daily.  Yes Historical Provider, MD  ranitidine (ZANTAC) 150 MG tablet Take 150 mg by mouth daily as needed for heartburn.   Yes Historical Provider, MD  sertraline (ZOLOFT) 25 MG tablet Take 25 mg by mouth daily.   Yes Historical Provider, MD  tacrolimus (PROGRAF) 1 MG capsule Take 4 mg by mouth 2 (two) times daily.   Yes Historical Provider, MD  albuterol (PROVENTIL) (2.5 MG/3ML) 0.083% nebulizer solution Take 2.5 mg by nebulization every 6 (six) hours as needed for wheezing.    Historical Provider, MD    BP 104/45 mmHg  Pulse 66  Temp(Src) 98.4 F (36.9 C) (Oral)  Resp 17  Ht 6\' 1"  (1.854 m)  Wt 225 lb (102.059 kg)  BMI 29.69 kg/m2  SpO2 94% Physical Exam  Constitutional: He is oriented to person, place, and time. He appears well-developed and well-nourished. No distress.  HENT:  Mouth/Throat: Oropharynx is clear and moist.  Eyes: Conjunctivae are normal. No scleral icterus.  Neck: Neck supple. No JVD present. No tracheal deviation present.  Cardiovascular: Normal rate, regular rhythm, normal heart sounds and intact distal pulses.  Exam reveals no gallop and no friction rub.   No murmur heard. Pulmonary/Chest: Effort normal and breath sounds normal. No accessory muscle usage. No respiratory distress. He exhibits no tenderness.  Abdominal: Soft. Bowel sounds are normal. He exhibits no distension. There is no tenderness.  Musculoskeletal: Normal range of motion. He exhibits no edema or tenderness.  Neurological: He is alert and oriented to person, place, and time.  Skin: Skin is warm and dry. He is not diaphoretic.  Psychiatric: He has a normal mood and affect.  Nursing note and vitals reviewed.   ED Course  Procedures (including critical care time) Labs Review  Results for orders placed or performed during the hospital encounter of 08/19/14  Basic metabolic panel  Result Value Ref Range   Sodium 139 137 - 147 mEq/L   Potassium 4.5 3.7 - 5.3 mEq/L   Chloride 104 96 - 112 mEq/L   CO2 23 19 - 32 mEq/L   Glucose, Bld 111 (H) 70 - 99 mg/dL   BUN 20 6 - 23 mg/dL   Creatinine, Ser 1.611.35 0.50 - 1.35 mg/dL   Calcium 9.5 8.4 - 09.610.5 mg/dL   GFR calc non Af Amer 64 (L) >90 mL/min   GFR calc Af Amer 75 (L) >90 mL/min   Anion gap 12 5 - 15  CBC  Result Value Ref Range   WBC 4.4 4.0 - 10.5 K/uL   RBC 4.30 4.22 - 5.81 MIL/uL   Hemoglobin 12.3 (L) 13.0 - 17.0 g/dL   HCT 04.538.3 (L) 40.939.0 - 81.152.0 %   MCV 89.1 78.0 - 100.0 fL   MCH 28.6 26.0 - 34.0 pg   MCHC 32.1 30.0 - 36.0 g/dL   RDW  91.413.7 78.211.5 - 95.615.5 %   Platelets 203 150 - 400 K/uL  BNP (order ONLY if patient complains of dyspnea/SOB AND you have documented it for THIS visit)  Result Value Ref Range   Pro B Natriuretic peptide (BNP) 96.9 0 - 125 pg/mL  I-stat troponin, ED ([facility])  Result Value Ref Range   Troponin i, poc 0.00 0.00 - 0.08 ng/mL   Comment 3           Dg Chest Port 1 View  08/19/2014   CLINICAL DATA:  Atrial fibrillation.  Shortness of breath.  EXAM: PORTABLE CHEST - 1 VIEW  COMPARISON:  None.  FINDINGS:  The lungs are clear. Heart size is normal. There is no pneumothorax or pleural effusion.  IMPRESSION: No acute disease.   Electronically Signed   By: Drusilla Kanner M.D.   On: 08/19/2014 13:38       EKG Interpretation   Date/Time:  Monday August 19 2014 13:09:29 EST Ventricular Rate:  70 PR Interval:  160 QRS Duration: 86 QT Interval:  375 QTC Calculation: 405 R Axis:   36 Text Interpretation:  Sinus rhythm No previous tracing Confirmed by Denton Lank   MD, Caryn Bee ([id number]) on 08/19/2014 1:24:28 PM      MDM   Continuous monitor. Pulse ox. Ecg. Labs.  Reviewed nursing notes and prior charts for additional history.   Recheck remains in nsr, no dysrhythmia, no afib.  Pt denies any chest pain or discomfort.  Trop neg, cxr neg, remains in nsr.   Pt currently appears stable for d/c.      Suzi Roots, MD 08/19/14 [phone]

## 2014-08-19 NOTE — ED Notes (Signed)
Pt states wasn't feeling well last night began to develop chest tightness around noon today with sob and light sweating per p tdenies nausea medics gave pt aspirin 324 mg po iand ntg x2 subl pta

## 2014-08-19 NOTE — Discharge Instructions (Signed)
It was our pleasure to provide your ER care today - we hope that you feel better.  Continue metoprolol.  Limit caffeine use.   Follow up with your cardiologist in the coming week - call office to arrange appointment.  [facility].  Return to ER if worse, new symptoms, persistent fast heart beat, faint, chest pain or discomfort, trouble breathing, other concern.     Atrial Fibrillation Atrial fibrillation is a type of irregular heart rhythm (arrhythmia). During atrial fibrillation, the upper chambers of the heart (atria) quiver continuously in a chaotic pattern. This causes an irregular and often rapid heart rate.  Atrial fibrillation is the result of the heart becoming overloaded with disorganized signals that tell it to beat. These signals are normally released one at a time by a part of the right atrium called the sinoatrial node. They then travel from the atria to the lower chambers of the heart (ventricles), causing the atria and ventricles to contract and pump blood as they pass. In atrial fibrillation, parts of the atria outside of the sinoatrial node also release these signals. This results in two problems. First, the atria receive so many signals that they do not have time to fully contract. Second, the ventricles, which can only receive one signal at a time, beat irregularly and out of rhythm with the atria.  There are three types of atrial fibrillation:   Paroxysmal. Paroxysmal atrial fibrillation starts suddenly and stops on its own within a week.  Persistent. Persistent atrial fibrillation lasts for more than a week. It may stop on its own or with treatment.  Permanent. Permanent atrial fibrillation does not go away. Episodes of atrial fibrillation may lead to permanent atrial fibrillation. Atrial fibrillation can prevent your heart from pumping blood normally. It increases your risk of stroke and can lead to heart failure.  CAUSES    Heart conditions, including a heart attack, heart failure, coronary artery disease, and heart valve conditions.   Inflammation of the sac that surrounds the heart (pericarditis).  Blockage of an artery in the lungs (pulmonary embolism).  Pneumonia or other infections.  Chronic lung disease.  Thyroid problems, especially if the thyroid is overactive (hyperthyroidism).  Caffeine, excessive alcohol use, and use of some illegal drugs.   Use of some medicines, including certain decongestants and diet pills.  Heart surgery.   Birth defects.  Sometimes, no cause can be found. When this happens, the atrial fibrillation is called lone atrial fibrillation. The risk of complications from atrial fibrillation increases if you have lone atrial fibrillation and you are age 40 years or older. RISK FACTORS  Heart failure.  Coronary artery disease.  Diabetes mellitus.   High blood pressure (hypertension).   Obesity.   Other arrhythmias.   Increased age. SIGNS AND SYMPTOMS   A feeling that your heart is beating rapidly or irregularly.   A feeling of discomfort or pain in your chest.   Shortness of breath.   Sudden light-headedness or weakness.   Getting tired easily when exercising.   Urinating more often than normal (mainly when atrial fibrillation first begins).  In paroxysmal atrial fibrillation, symptoms may start and suddenly stop. DIAGNOSIS  Your health care provider may be able to detect atrial fibrillation when taking your pulse. Your health care provider may have you take a test called an ambulatory electrocardiogram (ECG). An ECG records your heartbeat patterns over a 24-hour period. You may also have other tests, such  as:  Transthoracic echocardiogram (TTE). During echocardiography, sound waves are used to evaluate how blood flows through your heart.  Transesophageal echocardiogram (TEE).  Stress test. There is more than one type of stress test. If a  stress test is needed, ask your health care provider about which type is best for you.  Chest X-ray exam.  Blood tests.  Computed tomography (CT). TREATMENT  Treatment may include:  Treating any underlying conditions. For example, if you have an overactive thyroid, treating the condition may correct atrial fibrillation.  Taking medicine. Medicines may be given to control a rapid heart rate or to prevent blood clots, heart failure, or a stroke.  Having a procedure to correct the rhythm of the heart:  Electrical cardioversion. During electrical cardioversion, a controlled, low-energy shock is delivered to the heart through your skin. If you have chest pain, very low blood pressure, or sudden heart failure, this procedure may need to be done as an emergency.  Catheter ablation. During this procedure, heart tissues that send the signals that cause atrial fibrillation are destroyed.  Surgical ablation. During this surgery, thin lines of heart tissue that carry the abnormal signals are destroyed. This procedure can either be an open-heart surgery or a minimally invasive surgery. With the minimally invasive surgery, small cuts are made to access the heart instead of a large opening.  Pulmonary venous isolation. During this surgery, tissue around the veins that carry blood from the lungs (pulmonary veins) is destroyed. This tissue is thought to carry the abnormal signals. HOME CARE INSTRUCTIONS   Take medicines only as directed by your health care provider. Some medicines can make atrial fibrillation worse or recur.  If blood thinners were prescribed by your health care provider, take them exactly as directed. Too much blood-thinning medicine can cause bleeding. If you take too little, you will not have the needed protection against stroke and other problems.  Perform blood tests at home if directed by your health care provider. Perform blood tests exactly as directed.  Quit smoking if you  smoke.  Do not drink alcohol.  Do not drink caffeinated beverages such as coffee, soda, and some teas. You may drink decaffeinated coffee, soda, or tea.   Maintain a healthy weight.Do not use diet pills unless your health care provider approves. They may make heart problems worse.   Follow diet instructions as directed by your health care provider.  Exercise regularly as directed by your health care provider.  Keep all follow-up visits as directed by your health care provider. This is important. PREVENTION  The following substances can cause atrial fibrillation to recur:   Caffeinated beverages.  Alcohol.  Certain medicines, especially those used for breathing problems.  Certain herbs and herbal medicines, such as those containing ephedra or ginseng.  Illegal drugs, such as cocaine and amphetamines. Sometimes medicines are given to prevent atrial fibrillation from recurring. Proper treatment of any underlying condition is also important in helping prevent recurrence.  SEEK MEDICAL CARE IF:  You notice a change in the rate, rhythm, or strength of your heartbeat.  You suddenly begin urinating more frequently.  You tire more easily when exerting yourself or exercising. SEEK IMMEDIATE MEDICAL CARE IF:   You have chest pain, abdominal pain, sweating, or weakness.  You feel nauseous.  You have shortness of breath.  You suddenly have swollen feet and ankles.  You feel dizzy.  Your face or limbs feel numb or weak.  You have a change in your vision or speech.  MAKE SURE YOU:   Understand these instructions.  Will watch your condition.  Will get help right away if you are not doing well or get worse. Document Released: 09/06/2005 Document Revised: 01/21/2014 Document Reviewed: 10/17/2012 [facility] Patient Information 2015 [place], [place]. This information is not intended to replace advice given to you by your health care provider. Make sure you discuss any questions you  have with your health care provider.    Palpitations A palpitation is the feeling that your heartbeat is irregular or is faster than normal. It may feel like your heart is fluttering or skipping a beat. Palpitations are usually not a serious problem. However, in some cases, you may need further medical evaluation. CAUSES  Palpitations can be caused by:  Smoking.  Caffeine or other stimulants, such as diet pills or energy drinks.  Alcohol.  Stress and anxiety.  Strenuous physical activity.  Fatigue.  Certain medicines.  Heart disease, especially if you have a history of irregular heart rhythms (arrhythmias), such as atrial fibrillation, atrial flutter, or supraventricular tachycardia.  An improperly working pacemaker or defibrillator. DIAGNOSIS  To find the cause of your palpitations, your health care provider will take your medical history and perform a physical exam. Your health care provider may also have you take a test called an ambulatory electrocardiogram (ECG). An ECG records your heartbeat patterns over a 24-hour period. You may also have other tests, such as:  Transthoracic echocardiogram (TTE). During echocardiography, sound waves are used to evaluate how blood flows through your heart.  Transesophageal echocardiogram (TEE).  Cardiac monitoring. This allows your health care provider to monitor your heart rate and rhythm in real time.  Holter monitor. This is a portable device that records your heartbeat and can help diagnose heart arrhythmias. It allows your health care provider to track your heart activity for several days, if needed.  Stress tests by exercise or by giving medicine that makes the heart beat faster. TREATMENT  Treatment of palpitations depends on the cause of your symptoms and can vary greatly. Most cases of palpitations do not require any treatment other than time, relaxation, and monitoring your symptoms. Other causes, such as atrial fibrillation,  atrial flutter, or supraventricular tachycardia, usually require further treatment. HOME CARE INSTRUCTIONS   Avoid:  Caffeinated coffee, tea, soft drinks, diet pills, and energy drinks.  Chocolate.  Alcohol.  Stop smoking if you smoke.  Reduce your stress and anxiety. Things that can help you relax include:  A method of controlling things in your body, such as your heartbeats, with your mind (biofeedback).  Yoga.  Meditation.  Physical activity such as swimming, jogging, or walking.  Get plenty of rest and sleep. SEEK MEDICAL CARE IF:   You continue to have a fast or irregular heartbeat beyond 24 hours.  Your palpitations occur more often. SEEK IMMEDIATE MEDICAL CARE IF:  You have chest pain or shortness of breath.  You have a severe headache.  You feel dizzy or you faint. MAKE SURE YOU:  Understand these instructions.  Will watch your condition.  Will get help right away if you are not doing well or get worse. Document Released: 09/03/2000 Document Revised: 09/11/2013 Document Reviewed: 11/05/2011 [facility] Patient Information 2015 [place], [place]. This information is not intended to replace advice given to you by your health care provider. Make sure you discuss any questions you have with your health care provider.

## 2014-08-20 ENCOUNTER — Encounter: Payer: Self-pay | Admitting: Nephrology

## 2014-08-27 LAB — PHOSPHORUS: Phosphorus: 2.9 mg/dL (ref 2.5–4.9)

## 2014-08-27 LAB — CBC WITH DIFFERENTIAL/PLATELET
Basophil #: 0 10*3/uL (ref 0.0–0.1)
Basophil %: 0.8 %
Eosinophil #: 0.2 10*3/uL (ref 0.0–0.7)
Eosinophil %: 4.4 %
HCT: 43.4 % (ref 40.0–52.0)
HGB: 14 g/dL (ref 13.0–18.0)
Lymphocyte #: 0.4 10*3/uL — ABNORMAL LOW (ref 1.0–3.6)
Lymphocyte %: 7.3 %
MCH: 29.4 pg (ref 26.0–34.0)
MCHC: 32.1 g/dL (ref 32.0–36.0)
MCV: 92 fL (ref 80–100)
Monocyte #: 0.3 x10 3/mm (ref 0.2–1.0)
Monocyte %: 6.1 %
NEUTROS PCT: 81.4 %
Neutrophil #: 4.5 10*3/uL (ref 1.4–6.5)
Platelet: 217 10*3/uL (ref 150–440)
RBC: 4.74 10*6/uL (ref 4.40–5.90)
RDW: 14.3 % (ref 11.5–14.5)
WBC: 5.5 10*3/uL (ref 3.8–10.6)

## 2014-08-27 LAB — BASIC METABOLIC PANEL
Anion Gap: 6 — ABNORMAL LOW (ref 7–16)
BUN: 25 mg/dL — ABNORMAL HIGH (ref 7–18)
Calcium, Total: 9.2 mg/dL (ref 8.5–10.1)
Chloride: 106 mmol/L (ref 98–107)
Co2: 27 mmol/L (ref 21–32)
Creatinine: 1.65 mg/dL — ABNORMAL HIGH (ref 0.60–1.30)
EGFR (Non-African Amer.): 49 — ABNORMAL LOW
GFR CALC AF AMER: 60 — AB
Glucose: 157 mg/dL — ABNORMAL HIGH (ref 65–99)
Osmolality: 285 (ref 275–301)
Potassium: 3.9 mmol/L (ref 3.5–5.1)
SODIUM: 139 mmol/L (ref 136–145)

## 2014-08-27 LAB — MAGNESIUM: MAGNESIUM: 1.6 mg/dL — AB

## 2014-09-04 LAB — BASIC METABOLIC PANEL
ANION GAP: 3 — AB (ref 7–16)
BUN: 19 mg/dL — ABNORMAL HIGH (ref 7–18)
Calcium, Total: 8.8 mg/dL (ref 8.5–10.1)
Chloride: 110 mmol/L — ABNORMAL HIGH (ref 98–107)
Co2: 28 mmol/L (ref 21–32)
Creatinine: 1.66 mg/dL — ABNORMAL HIGH (ref 0.60–1.30)
EGFR (African American): 59 — ABNORMAL LOW
EGFR (Non-African Amer.): 49 — ABNORMAL LOW
Glucose: 90 mg/dL (ref 65–99)
OSMOLALITY: 283 (ref 275–301)
Potassium: 4.7 mmol/L (ref 3.5–5.1)
Sodium: 141 mmol/L (ref 136–145)

## 2014-09-04 LAB — CBC WITH DIFFERENTIAL/PLATELET
Basophil #: 0 10*3/uL (ref 0.0–0.1)
Basophil %: 0.6 %
EOS ABS: 0.2 10*3/uL (ref 0.0–0.7)
Eosinophil %: 4.7 %
HCT: 43 % (ref 40.0–52.0)
HGB: 13.8 g/dL (ref 13.0–18.0)
Lymphocyte #: 0.4 10*3/uL — ABNORMAL LOW (ref 1.0–3.6)
Lymphocyte %: 7.3 %
MCH: 29.5 pg (ref 26.0–34.0)
MCHC: 32.2 g/dL (ref 32.0–36.0)
MCV: 92 fL (ref 80–100)
Monocyte #: 0.4 x10 3/mm (ref 0.2–1.0)
Monocyte %: 9.1 %
NEUTROS PCT: 78.3 %
Neutrophil #: 3.8 10*3/uL (ref 1.4–6.5)
Platelet: 231 10*3/uL (ref 150–440)
RBC: 4.69 10*6/uL (ref 4.40–5.90)
RDW: 13.8 % (ref 11.5–14.5)
WBC: 4.9 10*3/uL (ref 3.8–10.6)

## 2014-09-04 LAB — PHOSPHORUS: Phosphorus: 2.8 mg/dL (ref 2.5–4.9)

## 2014-09-04 LAB — MAGNESIUM: MAGNESIUM: 1.6 mg/dL — AB

## 2014-09-17 LAB — CBC WITH DIFFERENTIAL/PLATELET
BASOS ABS: 0.1 10*3/uL (ref 0.0–0.1)
Basophil %: 1 %
EOS ABS: 0.3 10*3/uL (ref 0.0–0.7)
Eosinophil %: 6.3 %
HCT: 43.3 % (ref 40.0–52.0)
HGB: 13.9 g/dL (ref 13.0–18.0)
Lymphocyte #: 0.4 10*3/uL — ABNORMAL LOW (ref 1.0–3.6)
Lymphocyte %: 7.6 %
MCH: 29.5 pg (ref 26.0–34.0)
MCHC: 32 g/dL (ref 32.0–36.0)
MCV: 92 fL (ref 80–100)
Monocyte #: 0.6 x10 3/mm (ref 0.2–1.0)
Monocyte %: 11.7 %
Neutrophil #: 3.8 10*3/uL (ref 1.4–6.5)
Neutrophil %: 73.4 %
Platelet: 219 10*3/uL (ref 150–440)
RBC: 4.7 10*6/uL (ref 4.40–5.90)
RDW: 13.8 % (ref 11.5–14.5)
WBC: 5.1 10*3/uL (ref 3.8–10.6)

## 2014-09-17 LAB — COMPREHENSIVE METABOLIC PANEL
ALBUMIN: 4 g/dL (ref 3.4–5.0)
ALK PHOS: 141 U/L — AB
AST: 19 U/L (ref 15–37)
Anion Gap: 4 — ABNORMAL LOW (ref 7–16)
BUN: 20 mg/dL — ABNORMAL HIGH (ref 7–18)
Bilirubin,Total: 0.5 mg/dL (ref 0.2–1.0)
CHLORIDE: 109 mmol/L — AB (ref 98–107)
CO2: 26 mmol/L (ref 21–32)
CREATININE: 1.36 mg/dL — AB (ref 0.60–1.30)
Calcium, Total: 8.8 mg/dL (ref 8.5–10.1)
Glucose: 111 mg/dL — ABNORMAL HIGH (ref 65–99)
Osmolality: 281 (ref 275–301)
POTASSIUM: 4.4 mmol/L (ref 3.5–5.1)
SGPT (ALT): 41 U/L
Sodium: 139 mmol/L (ref 136–145)
TOTAL PROTEIN: 7.1 g/dL (ref 6.4–8.2)

## 2014-09-17 LAB — LIPID PANEL
CHOLESTEROL: 159 mg/dL (ref 0–200)
HDL: 35 mg/dL — AB (ref 40–60)
Ldl Cholesterol, Calc: 80 mg/dL (ref 0–100)
Triglycerides: 222 mg/dL — ABNORMAL HIGH (ref 0–200)
VLDL Cholesterol, Calc: 44 mg/dL — ABNORMAL HIGH (ref 5–40)

## 2014-09-17 LAB — GAMMA GT: GGT: 31 U/L (ref 5–85)

## 2014-09-17 LAB — MAGNESIUM: Magnesium: 1.3 mg/dL — ABNORMAL LOW

## 2014-09-17 LAB — PHOSPHORUS: Phosphorus: 3 mg/dL (ref 2.5–4.9)

## 2014-09-20 ENCOUNTER — Encounter: Payer: Self-pay | Admitting: Nephrology

## 2014-10-01 LAB — CBC WITH DIFFERENTIAL/PLATELET
BASOS PCT: 0.9 %
Basophil #: 0 10*3/uL (ref 0.0–0.1)
EOS ABS: 0.2 10*3/uL (ref 0.0–0.7)
Eosinophil %: 3.8 %
HCT: 45.2 % (ref 40.0–52.0)
HGB: 14.3 g/dL (ref 13.0–18.0)
LYMPHS ABS: 0.6 10*3/uL — AB (ref 1.0–3.6)
LYMPHS PCT: 9.9 %
MCH: 29 pg (ref 26.0–34.0)
MCHC: 31.7 g/dL — AB (ref 32.0–36.0)
MCV: 92 fL (ref 80–100)
Monocyte #: 0.7 x10 3/mm (ref 0.2–1.0)
Monocyte %: 12.9 %
NEUTROS ABS: 4.1 10*3/uL (ref 1.4–6.5)
Neutrophil %: 72.5 %
Platelet: 192 10*3/uL (ref 150–440)
RBC: 4.93 10*6/uL (ref 4.40–5.90)
RDW: 14.1 % (ref 11.5–14.5)
WBC: 5.7 10*3/uL (ref 3.8–10.6)

## 2014-10-01 LAB — BASIC METABOLIC PANEL
Anion Gap: 4 — ABNORMAL LOW (ref 7–16)
BUN: 23 mg/dL — AB (ref 7–18)
CALCIUM: 8.7 mg/dL (ref 8.5–10.1)
Chloride: 109 mmol/L — ABNORMAL HIGH (ref 98–107)
Co2: 27 mmol/L (ref 21–32)
Creatinine: 1.43 mg/dL — ABNORMAL HIGH (ref 0.60–1.30)
EGFR (Non-African Amer.): 58 — ABNORMAL LOW
Glucose: 95 mg/dL (ref 65–99)
OSMOLALITY: 283 (ref 275–301)
POTASSIUM: 4.2 mmol/L (ref 3.5–5.1)
Sodium: 140 mmol/L (ref 136–145)

## 2014-10-01 LAB — PHOSPHORUS: PHOSPHORUS: 3.7 mg/dL (ref 2.5–4.9)

## 2014-10-01 LAB — MAGNESIUM: MAGNESIUM: 1.6 mg/dL — AB

## 2014-10-10 LAB — CBC WITH DIFFERENTIAL/PLATELET
Basophil #: 0 10*3/uL (ref 0.0–0.1)
Basophil %: 0.5 %
EOS PCT: 6.7 %
Eosinophil #: 0.5 10*3/uL (ref 0.0–0.7)
HCT: 44.5 % (ref 40.0–52.0)
HGB: 14.3 g/dL (ref 13.0–18.0)
LYMPHS ABS: 0.8 10*3/uL — AB (ref 1.0–3.6)
Lymphocyte %: 10.3 %
MCH: 29.4 pg (ref 26.0–34.0)
MCHC: 32.1 g/dL (ref 32.0–36.0)
MCV: 91 fL (ref 80–100)
MONOS PCT: 10.9 %
Monocyte #: 0.8 x10 3/mm (ref 0.2–1.0)
NEUTROS ABS: 5.5 10*3/uL (ref 1.4–6.5)
Neutrophil %: 71.6 %
Platelet: 212 10*3/uL (ref 150–440)
RBC: 4.87 10*6/uL (ref 4.40–5.90)
RDW: 14 % (ref 11.5–14.5)
WBC: 7.8 10*3/uL (ref 3.8–10.6)

## 2014-10-10 LAB — BASIC METABOLIC PANEL
ANION GAP: 6 — AB (ref 7–16)
BUN: 20 mg/dL — ABNORMAL HIGH (ref 7–18)
CHLORIDE: 110 mmol/L — AB (ref 98–107)
CREATININE: 1.33 mg/dL — AB (ref 0.60–1.30)
Calcium, Total: 9.1 mg/dL (ref 8.5–10.1)
Co2: 26 mmol/L (ref 21–32)
EGFR (African American): >60
Glucose: 93 mg/dL (ref 65–99)
OSMOLALITY: 285 (ref 275–301)
POTASSIUM: 4.2 mmol/L (ref 3.5–5.1)
SODIUM: 142 mmol/L (ref 136–145)

## 2014-10-10 LAB — MAGNESIUM: MAGNESIUM: 1.6 mg/dL — AB

## 2014-10-10 LAB — PHOSPHORUS: PHOSPHORUS: 2.7 mg/dL (ref 2.5–4.9)

## 2014-10-21 ENCOUNTER — Other Ambulatory Visit: Payer: Self-pay | Admitting: Nephrology

## 2014-10-25 LAB — CBC WITH DIFFERENTIAL/PLATELET
Basophil #: 0.1 10*3/uL (ref 0.0–0.1)
Basophil %: 1.1 %
EOS PCT: 5.4 %
Eosinophil #: 0.3 10*3/uL (ref 0.0–0.7)
HCT: 43.7 % (ref 40.0–52.0)
HGB: 14.2 g/dL (ref 13.0–18.0)
LYMPHS PCT: 13.3 %
Lymphocyte #: 0.7 10*3/uL — ABNORMAL LOW (ref 1.0–3.6)
MCH: 29.1 pg (ref 26.0–34.0)
MCHC: 32.4 g/dL (ref 32.0–36.0)
MCV: 90 fL (ref 80–100)
Monocyte #: 0.6 x10 3/mm (ref 0.2–1.0)
Monocyte %: 11.8 %
NEUTROS PCT: 68.4 %
Neutrophil #: 3.8 10*3/uL (ref 1.4–6.5)
Platelet: 191 10*3/uL (ref 150–440)
RBC: 4.86 10*6/uL (ref 4.40–5.90)
RDW: 13.8 % (ref 11.5–14.5)
WBC: 5.5 10*3/uL (ref 3.8–10.6)

## 2014-10-25 LAB — MAGNESIUM: Magnesium: 1.7 mg/dL — ABNORMAL LOW

## 2014-10-25 LAB — BASIC METABOLIC PANEL
Anion Gap: 4 — ABNORMAL LOW (ref 7–16)
BUN: 18 mg/dL (ref 7–18)
CALCIUM: 8.9 mg/dL (ref 8.5–10.1)
CHLORIDE: 110 mmol/L — AB (ref 98–107)
CO2: 30 mmol/L (ref 21–32)
Creatinine: 1.38 mg/dL — ABNORMAL HIGH (ref 0.60–1.30)
EGFR (African American): >60
EGFR (Non-African Amer.): >60
Glucose: 93 mg/dL (ref 65–99)
OSMOLALITY: 288 (ref 275–301)
POTASSIUM: 4.2 mmol/L (ref 3.5–5.1)
Sodium: 144 mmol/L (ref 136–145)

## 2014-10-25 LAB — PHOSPHORUS: Phosphorus: 3 mg/dL (ref 2.5–4.9)

## 2014-11-19 ENCOUNTER — Other Ambulatory Visit
Admit: 2014-11-19 | Disposition: A | Discharge status: Home / Self Care | Payer: Self-pay | Attending: Nephrology | Admitting: Nephrology

## 2014-12-20 ENCOUNTER — Other Ambulatory Visit
Admit: 2014-12-20 | Disposition: A | Discharge status: Home / Self Care | Payer: Self-pay | Attending: Nephrology | Admitting: Nephrology

## 2015-01-07 NOTE — Op Note (Signed)
PATIENT NAME:  Dave [name], [name] MR#:  [id number] DATE OF BIRTH:  1976-06-24  DATE OF PROCEDURE:  05/04/2012  PREOPERATIVE DIAGNOSES:  1. End-stage renal disease.  2. Poorly functioning left arm AV fistula.  3. Hypertension.   POSTOPERATIVE DIAGNOSES:  1. End-stage renal disease.  2. Poorly functioning left arm AV fistula.  3. Hypertension.   PROCEDURES: 1. Ultrasound guidance for vascular access to left arm AV fistula.  2. Left upper extremity fistulogram and central venogram.  3. Percutaneous transluminal angioplasty of stenosis of cephalic vein near subclavian vein confluence with 8 and 10 mm diameter angioplasty balloon.  4. Percutaneous transluminal angioplasty of stenosis in the mid arm cephalic vein with a 10 mm diameter angioplasty balloon.   SURGEON: Annice NeedyJason S. Kaliyan Osbourn, MD  ANESTHESIA: Local with moderate conscious sedation.   ESTIMATED BLOOD LOSS: Minimal.   CONTRAST USED: 45 mL Visipaque.   INDICATION FOR PROCEDURE: This is a gentleman known to us for his dialysis access needs. He has had pain and swelling in his arm. He has also had poor function of his AV fistula. He had a previous fistulogram scheduled but this was canceled due to febrile illness. He has now recovered from this and is brought back for further evaluation. Ultrasound has shown two areas of possible stenosis within the AV fistula and fistulogram was recommended. Risks and benefits were discussed. Informed consent was obtained.   DESCRIPTION OF PROCEDURE: Patient is brought to the vascular interventional radiology suite. The left upper extremity was sterilely prepped and draped, a sterile surgical field was created. The fistula was accessed under direct ultrasound guidance without difficulty with a Seldinger needle and a 6 French sheath was placed. Imaging performed through the sheath reveals an approximately 70% stenosis in the mid arm cephalic vein near the venous buttonhole site and near the shoulder just before the  confluence into the subclavian vein was another 70% to 80% stenosis. For the more central lesion we initially started with an 8 mm diameter angioplasty balloon and upsized to a 10 mm diameter angioplasty balloon and following this there was only mild residual stenosis in the 10% to 20% range. In the mid upper arm cephalic vein this was treated with a 10 mm diameter angioplasty balloon. There was still some narrowing following angioplasty and this did correlate to the buttonhole site. Narrowing did not appear flow limiting after angioplasty but was in the 40% to 50% range. A 10 mm balloon was the largest balloon we had on hand currently that would go through a 6 [place] sheath and so at this point as the stenosis was not flow limiting I elected to terminate procedure and recommended moving his access site to avoid future problems. The patient then a 4-0 Monocryl pursestring suture placed around his sheath. Sheath was removed. Pressure was held. Sterile dressing was placed. The patient tolerated procedure well and was taken to recovery room in stable condition.   ____________________________ Annice NeedyJason S. Carletta Feasel, MD jsd:cms D: 05/04/2012 09:20:01 ET T: 05/04/2012 11:07:49 ET JOB#: [id number]  cc: Annice NeedyJason S. Rashae Rother, MD, <Dictator> Annice NeedyJASON S Blayne Garlick MD ELECTRONICALLY SIGNED 05/04/2012 16:44

## 2015-01-10 NOTE — Op Note (Signed)
PATIENT NAME:  Dave [name], Dave [name] MR#:  [id number] DATE OF BIRTH:  [birth year]  DATE OF PROCEDURE:  10/31/2012  PREOPERATIVE DIAGNOSIS: Pilonidal cyst.   POSTOPERATIVE DIAGNOSIS: Pilonidal cyst.   PROCEDURE: Excision of pilonidal cyst.  ANESTHESIA: General.   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 Recently had an infection which was treated with antibiotics and resolved and multiple midline pits were noted in the skin.  Excision was recommended for definitive treatment.   DESCRIPTION OF PROCEDURE: The patient was placed on the stretcher in the supine position under general endotracheal anesthesia and rolled over into the prone position on the operating table. The left arm was positioned so that the thrill could be palpated in his AV fistula. The skin around the gluteal cleft was prepared with clippers and with Betadine paint and draped in a sterile manner. There were approximately 6 small pits in the skin over a distance of some 2 inches.  Each were probed and one site the probe went in about 8 mm.  All the pits were excised by making one long elliptical excision which was approximately 5 cm in length and carried down through subcutaneous tissues and excised as one specimen and submitted in formalin for routine pathology. Multiple small bleeding points were cauterized. Hemostasis subsequently appeared to be intact. The subcutaneous tissues were infiltrated with 0.5% Sensorcaine with epinephrine. Next, the wound was closed with interrupted 3-0 nylon vertical mattress sutures, placing sutures far enough apart to allow for drainage. Dressings were applied using cotton gauze, benzoin and paper tape. The patient tolerated surgery satisfactorily and was then prepared for transfer to the recovery room.     ____________________________ Shela CommonsJ. Renda RollsWilton Robby Bulkley, MD jws:ct D: 10/31/2012 09:34:03  ET T: 10/31/2012 10:15:03 ET JOB#: [id number]  cc: Adella HareJ. Wilton Kameo Bains, MD, <Dictator> Adella HareWILTON J Jess Toney MD ELECTRONICALLY SIGNED 11/04/2012 20:38

## 2015-01-17 LAB — COMPREHENSIVE METABOLIC PANEL
ALBUMIN: 4.1 g/dL
ALK PHOS: 71 U/L
ALT: 22 U/L
Anion Gap: 5 — ABNORMAL LOW (ref 7–16)
BILIRUBIN TOTAL: 0.6 mg/dL
BUN: 19 mg/dL
CHLORIDE: 105 mmol/L
Calcium, Total: 9 mg/dL
Co2: 29 mmol/L
Creatinine: 1.28 mg/dL — ABNORMAL HIGH
EGFR (African American): >60
EGFR (Non-African Amer.): >60
Glucose: 99 mg/dL
Potassium: 3.8 mmol/L
SGOT(AST): 18 U/L
Sodium: 139 mmol/L
Total Protein: 6.8 g/dL

## 2015-01-17 LAB — LIPID PANEL
Cholesterol: 194 mg/dL
HDL Cholesterol: 40 mg/dL
LDL CHOLESTEROL, CALC: 130 mg/dL — AB
Triglycerides: 120 mg/dL
VLDL Cholesterol, Calc: 24 mg/dL

## 2015-01-17 LAB — GAMMA GT: GGT: 26 U/L

## 2015-01-17 LAB — PHOSPHORUS: Phosphorus: 3.4 mg/dL

## 2015-01-17 LAB — MAGNESIUM: Magnesium: 1.7 mg/dL

## 2015-02-25 ENCOUNTER — Encounter
Admission: RE | Admit: 2015-02-25 | Discharge: 2015-02-25 | Disposition: A | Discharge status: Home / Self Care | Payer: Medicare Other | Source: Ambulatory Visit | Attending: Nephrology | Admitting: Nephrology

## 2015-02-25 DIAGNOSIS — D899 Disorder involving the immune mechanism, unspecified: Secondary | ICD-10-CM | POA: Insufficient documentation

## 2015-02-25 DIAGNOSIS — Z114 Encounter for screening for human immunodeficiency virus [HIV]: Secondary | ICD-10-CM | POA: Diagnosis not present

## 2015-02-25 DIAGNOSIS — Z79899 Other long term (current) drug therapy: Secondary | ICD-10-CM | POA: Diagnosis present

## 2015-02-25 DIAGNOSIS — D631 Anemia in chronic kidney disease: Secondary | ICD-10-CM | POA: Insufficient documentation

## 2015-02-25 DIAGNOSIS — B259 Cytomegaloviral disease, unspecified: Secondary | ICD-10-CM | POA: Diagnosis not present

## 2015-02-25 DIAGNOSIS — N390 Urinary tract infection, site not specified: Secondary | ICD-10-CM | POA: Diagnosis not present

## 2015-02-25 DIAGNOSIS — Z940 Kidney transplant status: Secondary | ICD-10-CM | POA: Insufficient documentation

## 2015-02-25 DIAGNOSIS — Z9483 Pancreas transplant status: Secondary | ICD-10-CM | POA: Insufficient documentation

## 2015-02-25 DIAGNOSIS — E559 Vitamin D deficiency, unspecified: Secondary | ICD-10-CM | POA: Insufficient documentation

## 2015-02-25 DIAGNOSIS — Z09 Encounter for follow-up examination after completed treatment for conditions other than malignant neoplasm: Secondary | ICD-10-CM | POA: Diagnosis present

## 2015-02-25 DIAGNOSIS — E1129 Type 2 diabetes mellitus with other diabetic kidney complication: Secondary | ICD-10-CM | POA: Diagnosis not present

## 2015-02-25 DIAGNOSIS — T8610 Unspecified complication of kidney transplant: Secondary | ICD-10-CM | POA: Insufficient documentation

## 2015-02-25 DIAGNOSIS — Z789 Other specified health status: Secondary | ICD-10-CM | POA: Insufficient documentation

## 2015-02-25 LAB — CBC WITH DIFFERENTIAL/PLATELET
BASOS PCT: 1 %
Basophils Absolute: 0 10*3/uL (ref 0–0.1)
Eosinophils Absolute: 0.3 10*3/uL (ref 0–0.7)
Eosinophils Relative: 4 %
HCT: 48.3 % (ref 40.0–52.0)
HEMOGLOBIN: 15.6 g/dL (ref 13.0–18.0)
LYMPHS PCT: 13 %
Lymphs Abs: 0.9 10*3/uL — ABNORMAL LOW (ref 1.0–3.6)
MCH: 28.8 pg (ref 26.0–34.0)
MCHC: 32.2 g/dL (ref 32.0–36.0)
MCV: 89.3 fL (ref 80.0–100.0)
MONOS PCT: 11 %
Monocytes Absolute: 0.8 10*3/uL (ref 0.2–1.0)
Neutro Abs: 4.9 10*3/uL (ref 1.4–6.5)
Neutrophils Relative %: 71 %
PLATELETS: 173 10*3/uL (ref 150–440)
RBC: 5.41 MIL/uL (ref 4.40–5.90)
RDW: 15.8 % — AB (ref 11.5–14.5)
WBC: 6.8 10*3/uL (ref 3.8–10.6)

## 2015-02-25 LAB — COMPREHENSIVE METABOLIC PANEL
ALK PHOS: 74 U/L (ref 38–126)
ALT: 32 U/L (ref 17–63)
ANION GAP: 7 (ref 5–15)
AST: 23 U/L (ref 15–41)
Albumin: 4.2 g/dL (ref 3.5–5.0)
BUN: 20 mg/dL (ref 6–20)
CALCIUM: 9.3 mg/dL (ref 8.9–10.3)
CHLORIDE: 107 mmol/L (ref 101–111)
CO2: 26 mmol/L (ref 22–32)
Creatinine, Ser: 1.24 mg/dL (ref 0.61–1.24)
Glucose, Bld: 106 mg/dL — ABNORMAL HIGH (ref 65–99)
POTASSIUM: 4 mmol/L (ref 3.5–5.1)
Sodium: 140 mmol/L (ref 135–145)
Total Bilirubin: 0.6 mg/dL (ref 0.3–1.2)
Total Protein: 6.9 g/dL (ref 6.5–8.1)

## 2015-02-25 LAB — GAMMA GT: GGT: 32 U/L (ref 7–50)

## 2015-02-25 LAB — MAGNESIUM: Magnesium: 1.6 mg/dL — ABNORMAL LOW (ref 1.7–2.4)

## 2015-02-25 LAB — PHOSPHORUS: Phosphorus: 3 mg/dL (ref 2.5–4.6)

## 2015-02-25 LAB — LIPID PANEL
CHOLESTEROL: 205 mg/dL — AB (ref 0–200)
HDL: 41 mg/dL (ref >40)
LDL CALC: 139 mg/dL — AB (ref 0–99)
TRIGLYCERIDES: 126 mg/dL (ref <150)
Total CHOL/HDL Ratio: 5 RATIO
VLDL: 25 mg/dL (ref 0–40)

## 2015-03-24 ENCOUNTER — Other Ambulatory Visit
Admission: RE | Admit: 2015-03-24 | Discharge: 2015-03-24 | Disposition: A | Discharge status: Home / Self Care | Payer: Medicare Other | Source: Ambulatory Visit | Attending: Nephrology | Admitting: Nephrology

## 2015-03-24 DIAGNOSIS — Z940 Kidney transplant status: Secondary | ICD-10-CM | POA: Diagnosis present

## 2015-03-24 DIAGNOSIS — Z79899 Other long term (current) drug therapy: Secondary | ICD-10-CM | POA: Insufficient documentation

## 2015-03-24 LAB — BASIC METABOLIC PANEL
Anion gap: 7 (ref 5–15)
BUN: 17 mg/dL (ref 6–20)
CALCIUM: 9.5 mg/dL (ref 8.9–10.3)
CHLORIDE: 106 mmol/L (ref 101–111)
CO2: 29 mmol/L (ref 22–32)
Creatinine, Ser: 1.27 mg/dL — ABNORMAL HIGH (ref 0.61–1.24)
GLUCOSE: 105 mg/dL — AB (ref 65–99)
Potassium: 4 mmol/L (ref 3.5–5.1)
Sodium: 142 mmol/L (ref 135–145)

## 2015-03-24 LAB — CBC WITH DIFFERENTIAL/PLATELET
BASOS PCT: 4 %
Basophils Absolute: 0.3 10*3/uL — ABNORMAL HIGH (ref 0–0.1)
Eosinophils Absolute: 0.2 10*3/uL (ref 0–0.7)
Eosinophils Relative: 3 %
HEMATOCRIT: 49.7 % (ref 40.0–52.0)
Hemoglobin: 16.1 g/dL (ref 13.0–18.0)
LYMPHS ABS: 0.5 10*3/uL — AB (ref 1.0–3.6)
Lymphocytes Relative: 8 %
MCH: 29.1 pg (ref 26.0–34.0)
MCHC: 32.3 g/dL (ref 32.0–36.0)
MCV: 90.1 fL (ref 80.0–100.0)
Monocytes Absolute: 0.6 10*3/uL (ref 0.2–1.0)
Monocytes Relative: 8 %
NEUTROS ABS: 5.7 10*3/uL (ref 1.4–6.5)
Neutrophils Relative %: 77 %
Platelets: 181 10*3/uL (ref 150–440)
RBC: 5.52 MIL/uL (ref 4.40–5.90)
RDW: 15.5 % — AB (ref 11.5–14.5)
WBC: 7.3 10*3/uL (ref 3.8–10.6)

## 2015-03-24 LAB — PHOSPHORUS: PHOSPHORUS: 2.7 mg/dL (ref 2.5–4.6)

## 2015-03-24 LAB — MAGNESIUM: MAGNESIUM: 1.7 mg/dL (ref 1.7–2.4)

## 2015-05-28 ENCOUNTER — Encounter
Admission: RE | Admit: 2015-05-28 | Discharge: 2015-05-28 | Disposition: A | Discharge status: Home / Self Care | Payer: Medicare Other | Source: Ambulatory Visit | Attending: Nephrology | Admitting: Nephrology

## 2015-05-28 DIAGNOSIS — Z940 Kidney transplant status: Secondary | ICD-10-CM | POA: Insufficient documentation

## 2015-05-28 DIAGNOSIS — T8610 Unspecified complication of kidney transplant: Secondary | ICD-10-CM | POA: Insufficient documentation

## 2015-05-28 DIAGNOSIS — D631 Anemia in chronic kidney disease: Secondary | ICD-10-CM | POA: Insufficient documentation

## 2015-05-28 DIAGNOSIS — Z9483 Pancreas transplant status: Secondary | ICD-10-CM | POA: Insufficient documentation

## 2015-05-28 DIAGNOSIS — Z09 Encounter for follow-up examination after completed treatment for conditions other than malignant neoplasm: Secondary | ICD-10-CM | POA: Insufficient documentation

## 2015-05-28 DIAGNOSIS — E559 Vitamin D deficiency, unspecified: Secondary | ICD-10-CM | POA: Diagnosis not present

## 2015-05-28 DIAGNOSIS — Z1159 Encounter for screening for other viral diseases: Secondary | ICD-10-CM | POA: Insufficient documentation

## 2015-05-28 DIAGNOSIS — Z79899 Other long term (current) drug therapy: Secondary | ICD-10-CM | POA: Diagnosis not present

## 2015-05-28 LAB — CBC WITH DIFFERENTIAL/PLATELET
BASOS ABS: 0.1 10*3/uL (ref 0–0.1)
Basophils Relative: 1 %
EOS PCT: 4 %
Eosinophils Absolute: 0.3 10*3/uL (ref 0–0.7)
HCT: 46.5 % (ref 40.0–52.0)
Hemoglobin: 15.2 g/dL (ref 13.0–18.0)
LYMPHS PCT: 13 %
Lymphs Abs: 1.2 10*3/uL (ref 1.0–3.6)
MCH: 29.5 pg (ref 26.0–34.0)
MCHC: 32.7 g/dL (ref 32.0–36.0)
MCV: 90.2 fL (ref 80.0–100.0)
MONO ABS: 1 10*3/uL (ref 0.2–1.0)
MONOS PCT: 11 %
Neutro Abs: 6.4 10*3/uL (ref 1.4–6.5)
Neutrophils Relative %: 71 %
PLATELETS: 196 10*3/uL (ref 150–440)
RBC: 5.16 MIL/uL (ref 4.40–5.90)
RDW: 15 % — AB (ref 11.5–14.5)
WBC: 8.9 10*3/uL (ref 3.8–10.6)

## 2015-05-28 LAB — COMPREHENSIVE METABOLIC PANEL
ALK PHOS: 82 U/L (ref 38–126)
ALT: 107 U/L — ABNORMAL HIGH (ref 17–63)
AST: 58 U/L — ABNORMAL HIGH (ref 15–41)
Albumin: 4.3 g/dL (ref 3.5–5.0)
Anion gap: 8 (ref 5–15)
BILIRUBIN TOTAL: 0.8 mg/dL (ref 0.3–1.2)
BUN: 19 mg/dL (ref 6–20)
CALCIUM: 9.4 mg/dL (ref 8.9–10.3)
CO2: 26 mmol/L (ref 22–32)
CREATININE: 1.22 mg/dL (ref 0.61–1.24)
Chloride: 109 mmol/L (ref 101–111)
Glucose, Bld: 102 mg/dL — ABNORMAL HIGH (ref 65–99)
Potassium: 4.1 mmol/L (ref 3.5–5.1)
Sodium: 143 mmol/L (ref 135–145)
TOTAL PROTEIN: 7 g/dL (ref 6.5–8.1)

## 2015-05-28 LAB — LIPID PANEL
Cholesterol: 200 mg/dL (ref 0–200)
HDL: 41 mg/dL (ref >40)
LDL CALC: 133 mg/dL — AB (ref 0–99)
Total CHOL/HDL Ratio: 4.9 RATIO
Triglycerides: 129 mg/dL (ref <150)
VLDL: 26 mg/dL (ref 0–40)

## 2015-05-28 LAB — PHOSPHORUS: Phosphorus: 3.7 mg/dL (ref 2.5–4.6)

## 2015-05-28 LAB — MAGNESIUM: MAGNESIUM: 1.5 mg/dL — AB (ref 1.7–2.4)

## 2015-05-28 LAB — GAMMA GT: GGT: 46 U/L (ref 7–50)

## 2015-06-05 IMAGING — CR DG CHEST 1V PORT
1 series · 1 of 1 positions shown · non-contrast
Comparison: 04/06/2013

CLINICAL DATA: Tachycardia, weakness

EXAM:
PORTABLE CHEST - 1 VIEW

[ap]
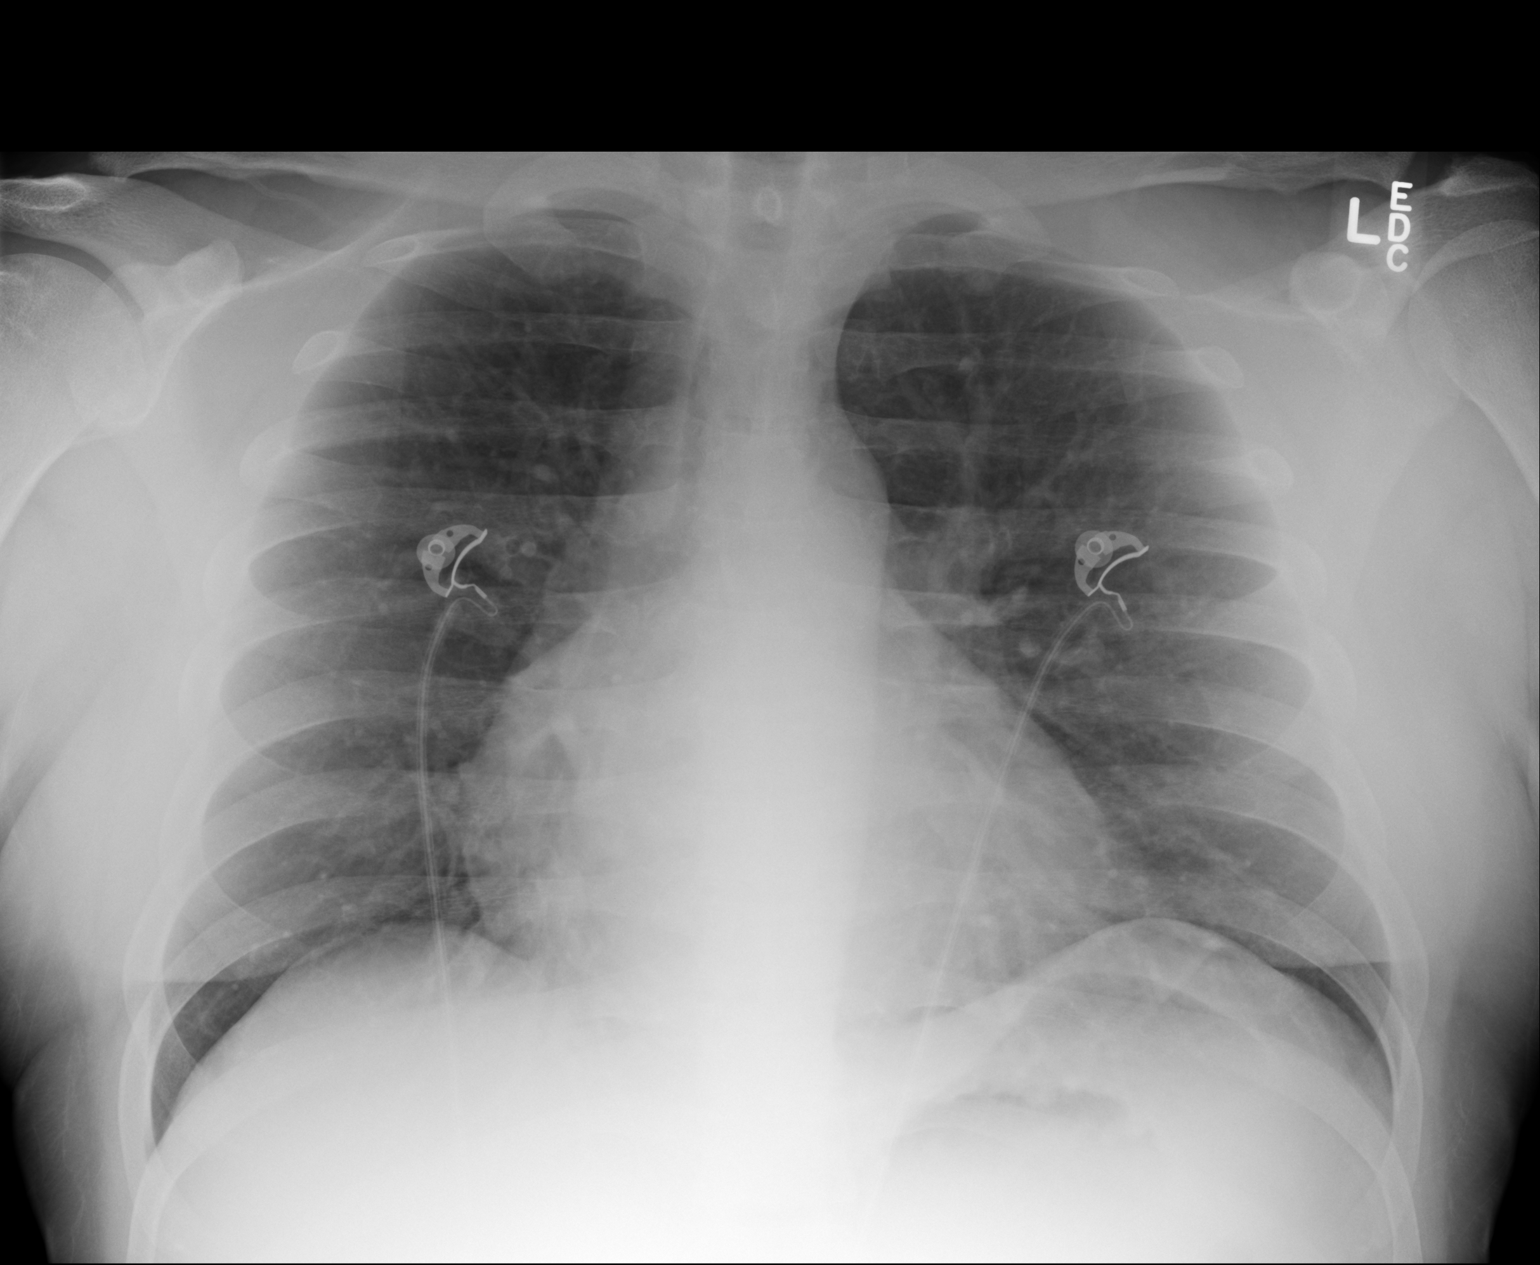

[1 of 1 positions shown; findings below may reference images not displayed]

FINDINGS: Exam is lordotic. Normal mediastinum and cardiac silhouette. Normal
pulmonary vasculature. No evidence of effusion, infiltrate, or
pneumothorax. No acute bony abnormality.
IMPRESSION: No acute cardiopulmonary process.

## 2015-07-29 ENCOUNTER — Encounter
Admission: RE | Admit: 2015-07-29 | Discharge: 2015-07-29 | Disposition: A | Discharge status: Home / Self Care | Payer: Medicare Other | Source: Ambulatory Visit | Attending: Nephrology | Admitting: Nephrology

## 2015-07-29 DIAGNOSIS — N390 Urinary tract infection, site not specified: Secondary | ICD-10-CM | POA: Insufficient documentation

## 2015-07-29 DIAGNOSIS — Z09 Encounter for follow-up examination after completed treatment for conditions other than malignant neoplasm: Secondary | ICD-10-CM | POA: Insufficient documentation

## 2015-07-29 DIAGNOSIS — E1129 Type 2 diabetes mellitus with other diabetic kidney complication: Secondary | ICD-10-CM | POA: Insufficient documentation

## 2015-07-29 DIAGNOSIS — Z940 Kidney transplant status: Secondary | ICD-10-CM | POA: Diagnosis present

## 2015-07-29 DIAGNOSIS — E559 Vitamin D deficiency, unspecified: Secondary | ICD-10-CM | POA: Insufficient documentation

## 2015-07-29 DIAGNOSIS — Z114 Encounter for screening for human immunodeficiency virus [HIV]: Secondary | ICD-10-CM | POA: Insufficient documentation

## 2015-07-29 DIAGNOSIS — Z789 Other specified health status: Secondary | ICD-10-CM | POA: Diagnosis not present

## 2015-07-29 DIAGNOSIS — D631 Anemia in chronic kidney disease: Secondary | ICD-10-CM | POA: Insufficient documentation

## 2015-07-29 DIAGNOSIS — Z79899 Other long term (current) drug therapy: Secondary | ICD-10-CM | POA: Diagnosis present

## 2015-07-29 LAB — BASIC METABOLIC PANEL
Anion gap: 8 (ref 5–15)
BUN: 21 mg/dL — AB (ref 6–20)
CO2: 25 mmol/L (ref 22–32)
Calcium: 9.4 mg/dL (ref 8.9–10.3)
Chloride: 104 mmol/L (ref 101–111)
Creatinine, Ser: 1.08 mg/dL (ref 0.61–1.24)
GFR calc Af Amer: >60 mL/min (ref >60)
GLUCOSE: 100 mg/dL — AB (ref 65–99)
POTASSIUM: 3.9 mmol/L (ref 3.5–5.1)
Sodium: 137 mmol/L (ref 135–145)

## 2015-07-29 LAB — CBC WITH DIFFERENTIAL/PLATELET
BASOS ABS: 0 10*3/uL (ref 0–0.1)
Basophils Relative: 1 %
EOS ABS: 0.2 10*3/uL (ref 0–0.7)
EOS PCT: 3 %
HCT: 45.3 % (ref 40.0–52.0)
Hemoglobin: 14.6 g/dL (ref 13.0–18.0)
LYMPHS PCT: 12 %
Lymphs Abs: 1 10*3/uL (ref 1.0–3.6)
MCH: 29.2 pg (ref 26.0–34.0)
MCHC: 32.2 g/dL (ref 32.0–36.0)
MCV: 90.5 fL (ref 80.0–100.0)
Monocytes Absolute: 0.9 10*3/uL (ref 0.2–1.0)
Monocytes Relative: 11 %
NEUTROS PCT: 73 %
Neutro Abs: 6 10*3/uL (ref 1.4–6.5)
PLATELETS: 218 10*3/uL (ref 150–440)
RBC: 5.01 MIL/uL (ref 4.40–5.90)
RDW: 14.9 % — AB (ref 11.5–14.5)
WBC: 8.1 10*3/uL (ref 3.8–10.6)

## 2015-07-29 LAB — MAGNESIUM: Magnesium: 1.7 mg/dL (ref 1.7–2.4)

## 2015-07-29 LAB — PHOSPHORUS: PHOSPHORUS: 3.9 mg/dL (ref 2.5–4.6)

## 2015-09-01 ENCOUNTER — Encounter
Admission: RE | Admit: 2015-09-01 | Discharge: 2015-09-01 | Disposition: A | Discharge status: Home / Self Care | Payer: BLUE CROSS/BLUE SHIELD | Source: Ambulatory Visit | Attending: Nephrology | Admitting: Nephrology

## 2015-09-01 DIAGNOSIS — Z029 Encounter for administrative examinations, unspecified: Secondary | ICD-10-CM | POA: Diagnosis present

## 2015-09-01 LAB — CBC WITH DIFFERENTIAL/PLATELET
BASOS PCT: 1 %
Basophils Absolute: 0 10*3/uL (ref 0–0.1)
Eosinophils Absolute: 0.2 10*3/uL (ref 0–0.7)
Eosinophils Relative: 3 %
HEMATOCRIT: 44.6 % (ref 40.0–52.0)
HEMOGLOBIN: 14.2 g/dL (ref 13.0–18.0)
LYMPHS PCT: 12 %
Lymphs Abs: 0.8 10*3/uL — ABNORMAL LOW (ref 1.0–3.6)
MCH: 28.3 pg (ref 26.0–34.0)
MCHC: 31.9 g/dL — ABNORMAL LOW (ref 32.0–36.0)
MCV: 88.7 fL (ref 80.0–100.0)
MONO ABS: 0.6 10*3/uL (ref 0.2–1.0)
MONOS PCT: 9 %
NEUTROS ABS: 5.3 10*3/uL (ref 1.4–6.5)
NEUTROS PCT: 75 %
PLATELETS: 225 10*3/uL (ref 150–440)
RBC: 5.02 MIL/uL (ref 4.40–5.90)
RDW: 15.5 % — AB (ref 11.5–14.5)
WBC: 7 10*3/uL (ref 3.8–10.6)

## 2015-09-01 LAB — COMPREHENSIVE METABOLIC PANEL
ALK PHOS: 69 U/L (ref 38–126)
ALT: 22 U/L (ref 17–63)
AST: 16 U/L (ref 15–41)
Albumin: 4.3 g/dL (ref 3.5–5.0)
Anion gap: 6 (ref 5–15)
BUN: 18 mg/dL (ref 6–20)
CALCIUM: 9.4 mg/dL (ref 8.9–10.3)
CHLORIDE: 109 mmol/L (ref 101–111)
CO2: 25 mmol/L (ref 22–32)
CREATININE: 1 mg/dL (ref 0.61–1.24)
GFR calc Af Amer: >60 mL/min (ref >60)
GFR calc non Af Amer: >60 mL/min (ref >60)
Glucose, Bld: 103 mg/dL — ABNORMAL HIGH (ref 65–99)
Potassium: 4.1 mmol/L (ref 3.5–5.1)
SODIUM: 140 mmol/L (ref 135–145)
Total Bilirubin: 0.7 mg/dL (ref 0.3–1.2)
Total Protein: 6.9 g/dL (ref 6.5–8.1)

## 2015-09-01 LAB — MAGNESIUM: Magnesium: 1.6 mg/dL — ABNORMAL LOW (ref 1.7–2.4)

## 2015-09-01 LAB — GAMMA GT: GGT: 29 U/L (ref 7–50)

## 2015-09-01 LAB — LIPID PANEL
Cholesterol: 201 mg/dL — ABNORMAL HIGH (ref 0–200)
HDL: 40 mg/dL — ABNORMAL LOW (ref >40)
LDL Cholesterol: 150 mg/dL — ABNORMAL HIGH (ref 0–99)
TRIGLYCERIDES: 53 mg/dL (ref <150)
Total CHOL/HDL Ratio: 5 RATIO
VLDL: 11 mg/dL (ref 0–40)

## 2015-09-01 LAB — PHOSPHORUS: Phosphorus: 3 mg/dL (ref 2.5–4.6)

## 2015-10-08 ENCOUNTER — Encounter
Admission: RE | Admit: 2015-10-08 | Discharge: 2015-10-08 | Disposition: A | Discharge status: Home / Self Care | Payer: Medicare Other | Source: Ambulatory Visit | Attending: Nephrology | Admitting: Nephrology

## 2015-10-08 DIAGNOSIS — E559 Vitamin D deficiency, unspecified: Secondary | ICD-10-CM | POA: Diagnosis not present

## 2015-10-08 DIAGNOSIS — Z09 Encounter for follow-up examination after completed treatment for conditions other than malignant neoplasm: Secondary | ICD-10-CM | POA: Insufficient documentation

## 2015-10-08 DIAGNOSIS — Z940 Kidney transplant status: Secondary | ICD-10-CM | POA: Diagnosis present

## 2015-10-08 DIAGNOSIS — Z1159 Encounter for screening for other viral diseases: Secondary | ICD-10-CM | POA: Diagnosis not present

## 2015-10-08 DIAGNOSIS — Z7983 Long term (current) use of bisphosphonates: Secondary | ICD-10-CM | POA: Insufficient documentation

## 2015-10-08 DIAGNOSIS — Z79899 Other long term (current) drug therapy: Secondary | ICD-10-CM | POA: Insufficient documentation

## 2015-10-08 LAB — BASIC METABOLIC PANEL
ANION GAP: 8 (ref 5–15)
BUN: 23 mg/dL — AB (ref 6–20)
CALCIUM: 9.6 mg/dL (ref 8.9–10.3)
CO2: 25 mmol/L (ref 22–32)
Chloride: 106 mmol/L (ref 101–111)
Creatinine, Ser: 1.07 mg/dL (ref 0.61–1.24)
GFR calc Af Amer: >60 mL/min (ref >60)
GFR calc non Af Amer: >60 mL/min (ref >60)
GLUCOSE: 108 mg/dL — AB (ref 65–99)
Potassium: 4 mmol/L (ref 3.5–5.1)
Sodium: 139 mmol/L (ref 135–145)

## 2015-10-08 LAB — CBC WITH DIFFERENTIAL/PLATELET
Basophils Absolute: 0 10*3/uL (ref 0–0.1)
Basophils Relative: 1 %
EOS PCT: 3 %
Eosinophils Absolute: 0.2 10*3/uL (ref 0–0.7)
HCT: 44.6 % (ref 40.0–52.0)
HEMOGLOBIN: 14.8 g/dL (ref 13.0–18.0)
LYMPHS ABS: 0.9 10*3/uL — AB (ref 1.0–3.6)
LYMPHS PCT: 11 %
MCH: 29.1 pg (ref 26.0–34.0)
MCHC: 33.3 g/dL (ref 32.0–36.0)
MCV: 87.4 fL (ref 80.0–100.0)
MONOS PCT: 8 %
Monocytes Absolute: 0.6 10*3/uL (ref 0.2–1.0)
NEUTROS PCT: 77 %
Neutro Abs: 6.5 10*3/uL (ref 1.4–6.5)
Platelets: 213 10*3/uL (ref 150–440)
RBC: 5.1 MIL/uL (ref 4.40–5.90)
RDW: 16.3 % — ABNORMAL HIGH (ref 11.5–14.5)
WBC: 8.3 10*3/uL (ref 3.8–10.6)

## 2015-10-08 LAB — MAGNESIUM: Magnesium: 1.5 mg/dL — ABNORMAL LOW (ref 1.7–2.4)

## 2015-10-08 LAB — PHOSPHORUS: Phosphorus: 3.3 mg/dL (ref 2.5–4.6)

## 2015-12-31 ENCOUNTER — Encounter
Admission: RE | Admit: 2015-12-31 | Discharge: 2015-12-31 | Disposition: A | Discharge status: Home / Self Care | Payer: Medicare Other | Source: Ambulatory Visit | Attending: Nephrology | Admitting: Nephrology

## 2015-12-31 DIAGNOSIS — Z114 Encounter for screening for human immunodeficiency virus [HIV]: Secondary | ICD-10-CM | POA: Diagnosis not present

## 2015-12-31 DIAGNOSIS — Z789 Other specified health status: Secondary | ICD-10-CM | POA: Insufficient documentation

## 2015-12-31 DIAGNOSIS — Z79899 Other long term (current) drug therapy: Secondary | ICD-10-CM | POA: Diagnosis present

## 2015-12-31 DIAGNOSIS — Z940 Kidney transplant status: Secondary | ICD-10-CM | POA: Diagnosis present

## 2015-12-31 DIAGNOSIS — D631 Anemia in chronic kidney disease: Secondary | ICD-10-CM | POA: Diagnosis not present

## 2015-12-31 DIAGNOSIS — E1129 Type 2 diabetes mellitus with other diabetic kidney complication: Secondary | ICD-10-CM | POA: Insufficient documentation

## 2015-12-31 DIAGNOSIS — E559 Vitamin D deficiency, unspecified: Secondary | ICD-10-CM | POA: Diagnosis not present

## 2015-12-31 DIAGNOSIS — Z09 Encounter for follow-up examination after completed treatment for conditions other than malignant neoplasm: Secondary | ICD-10-CM | POA: Insufficient documentation

## 2015-12-31 DIAGNOSIS — N390 Urinary tract infection, site not specified: Secondary | ICD-10-CM | POA: Diagnosis not present

## 2015-12-31 LAB — BASIC METABOLIC PANEL
Anion gap: 8 (ref 5–15)
BUN: 20 mg/dL (ref 6–20)
CALCIUM: 9.4 mg/dL (ref 8.9–10.3)
CO2: 25 mmol/L (ref 22–32)
CREATININE: 1.01 mg/dL (ref 0.61–1.24)
Chloride: 105 mmol/L (ref 101–111)
GFR calc non Af Amer: >60 mL/min (ref >60)
Glucose, Bld: 94 mg/dL (ref 65–99)
Potassium: 3.9 mmol/L (ref 3.5–5.1)
SODIUM: 138 mmol/L (ref 135–145)

## 2015-12-31 LAB — CBC WITH DIFFERENTIAL/PLATELET
BASOS ABS: 0 10*3/uL (ref 0–0.1)
Basophils Relative: 0 %
EOS PCT: 3 %
Eosinophils Absolute: 0.2 10*3/uL (ref 0–0.7)
HEMATOCRIT: 44.2 % (ref 40.0–52.0)
Hemoglobin: 14.6 g/dL (ref 13.0–18.0)
LYMPHS PCT: 13 %
Lymphs Abs: 1 10*3/uL (ref 1.0–3.6)
MCH: 29.6 pg (ref 26.0–34.0)
MCHC: 33.1 g/dL (ref 32.0–36.0)
MCV: 89.4 fL (ref 80.0–100.0)
MONOS PCT: 9 %
Monocytes Absolute: 0.7 10*3/uL (ref 0.2–1.0)
Neutro Abs: 5.9 10*3/uL (ref 1.4–6.5)
Neutrophils Relative %: 75 %
PLATELETS: 185 10*3/uL (ref 150–440)
RBC: 4.94 MIL/uL (ref 4.40–5.90)
RDW: 16.5 % — AB (ref 11.5–14.5)
WBC: 7.8 10*3/uL (ref 3.8–10.6)

## 2015-12-31 LAB — MAGNESIUM: MAGNESIUM: 1.5 mg/dL — AB (ref 1.7–2.4)

## 2015-12-31 LAB — PHOSPHORUS: Phosphorus: 3.2 mg/dL (ref 2.5–4.6)

## 2016-01-30 ENCOUNTER — Encounter
Admission: RE | Admit: 2016-01-30 | Discharge: 2016-01-30 | Disposition: A | Discharge status: Home / Self Care | Payer: Medicare Other | Source: Ambulatory Visit | Attending: Nephrology | Admitting: Nephrology

## 2016-01-30 DIAGNOSIS — E1129 Type 2 diabetes mellitus with other diabetic kidney complication: Secondary | ICD-10-CM | POA: Diagnosis not present

## 2016-01-30 DIAGNOSIS — Z114 Encounter for screening for human immunodeficiency virus [HIV]: Secondary | ICD-10-CM | POA: Diagnosis not present

## 2016-01-30 DIAGNOSIS — Z09 Encounter for follow-up examination after completed treatment for conditions other than malignant neoplasm: Secondary | ICD-10-CM | POA: Diagnosis not present

## 2016-01-30 DIAGNOSIS — D631 Anemia in chronic kidney disease: Secondary | ICD-10-CM | POA: Insufficient documentation

## 2016-01-30 DIAGNOSIS — Z79899 Other long term (current) drug therapy: Secondary | ICD-10-CM | POA: Insufficient documentation

## 2016-01-30 DIAGNOSIS — N390 Urinary tract infection, site not specified: Secondary | ICD-10-CM | POA: Diagnosis not present

## 2016-01-30 DIAGNOSIS — Z789 Other specified health status: Secondary | ICD-10-CM | POA: Insufficient documentation

## 2016-01-30 DIAGNOSIS — E559 Vitamin D deficiency, unspecified: Secondary | ICD-10-CM | POA: Diagnosis not present

## 2016-01-30 DIAGNOSIS — Z940 Kidney transplant status: Secondary | ICD-10-CM | POA: Insufficient documentation

## 2016-01-30 LAB — BASIC METABOLIC PANEL
Anion gap: 7 (ref 5–15)
BUN: 19 mg/dL (ref 6–20)
CALCIUM: 9.6 mg/dL (ref 8.9–10.3)
CO2: 25 mmol/L (ref 22–32)
CREATININE: 1.28 mg/dL — AB (ref 0.61–1.24)
Chloride: 108 mmol/L (ref 101–111)
Glucose, Bld: 127 mg/dL — ABNORMAL HIGH (ref 65–99)
Potassium: 4 mmol/L (ref 3.5–5.1)
SODIUM: 140 mmol/L (ref 135–145)

## 2016-01-30 LAB — CBC WITH DIFFERENTIAL/PLATELET
BASOS ABS: 0 10*3/uL (ref 0–0.1)
EOS ABS: 0.2 10*3/uL (ref 0–0.7)
Eosinophils Relative: 2 %
HEMATOCRIT: 45.2 % (ref 40.0–52.0)
Hemoglobin: 15.1 g/dL (ref 13.0–18.0)
Lymphocytes Relative: 12 %
Lymphs Abs: 1 10*3/uL (ref 1.0–3.6)
MCH: 30.6 pg (ref 26.0–34.0)
MCHC: 33.5 g/dL (ref 32.0–36.0)
MCV: 91.3 fL (ref 80.0–100.0)
MONO ABS: 0.7 10*3/uL (ref 0.2–1.0)
Monocytes Relative: 8 %
NEUTROS ABS: 6.4 10*3/uL (ref 1.4–6.5)
Neutrophils Relative %: 78 %
PLATELETS: 194 10*3/uL (ref 150–440)
RBC: 4.95 MIL/uL (ref 4.40–5.90)
RDW: 16.8 % — AB (ref 11.5–14.5)
WBC: 8.3 10*3/uL (ref 3.8–10.6)

## 2016-01-30 LAB — MAGNESIUM: MAGNESIUM: 1.5 mg/dL — AB (ref 1.7–2.4)

## 2016-01-30 LAB — PHOSPHORUS: PHOSPHORUS: 3.6 mg/dL (ref 2.5–4.6)

## 2016-03-02 ENCOUNTER — Other Ambulatory Visit
Admission: RE | Admit: 2016-03-02 | Discharge: 2016-03-02 | Disposition: A | Discharge status: Home / Self Care | Payer: Medicare Other | Source: Ambulatory Visit | Attending: Nephrology | Admitting: Nephrology

## 2016-03-02 DIAGNOSIS — Z09 Encounter for follow-up examination after completed treatment for conditions other than malignant neoplasm: Secondary | ICD-10-CM | POA: Insufficient documentation

## 2016-03-02 DIAGNOSIS — E559 Vitamin D deficiency, unspecified: Secondary | ICD-10-CM | POA: Diagnosis not present

## 2016-03-02 DIAGNOSIS — Z79899 Other long term (current) drug therapy: Secondary | ICD-10-CM | POA: Insufficient documentation

## 2016-03-02 DIAGNOSIS — Z940 Kidney transplant status: Secondary | ICD-10-CM | POA: Diagnosis present

## 2016-03-02 DIAGNOSIS — N390 Urinary tract infection, site not specified: Secondary | ICD-10-CM | POA: Insufficient documentation

## 2016-03-02 DIAGNOSIS — E1129 Type 2 diabetes mellitus with other diabetic kidney complication: Secondary | ICD-10-CM | POA: Insufficient documentation

## 2016-03-02 DIAGNOSIS — Z114 Encounter for screening for human immunodeficiency virus [HIV]: Secondary | ICD-10-CM | POA: Insufficient documentation

## 2016-03-02 LAB — BASIC METABOLIC PANEL
ANION GAP: 8 (ref 5–15)
BUN: 18 mg/dL (ref 6–20)
CALCIUM: 9.4 mg/dL (ref 8.9–10.3)
CHLORIDE: 110 mmol/L (ref 101–111)
CO2: 23 mmol/L (ref 22–32)
Creatinine, Ser: 1.06 mg/dL (ref 0.61–1.24)
GFR calc Af Amer: >60 mL/min (ref >60)
GFR calc non Af Amer: >60 mL/min (ref >60)
GLUCOSE: 100 mg/dL — AB (ref 65–99)
Potassium: 4 mmol/L (ref 3.5–5.1)
Sodium: 141 mmol/L (ref 135–145)

## 2016-03-02 LAB — CBC WITH DIFFERENTIAL/PLATELET
BASOS ABS: 0 10*3/uL (ref 0–0.1)
Basophils Relative: 1 %
EOS PCT: 3 %
Eosinophils Absolute: 0.2 10*3/uL (ref 0–0.7)
HEMATOCRIT: 44.2 % (ref 40.0–52.0)
HEMOGLOBIN: 14.9 g/dL (ref 13.0–18.0)
LYMPHS ABS: 0.8 10*3/uL — AB (ref 1.0–3.6)
LYMPHS PCT: 13 %
MCH: 30.7 pg (ref 26.0–34.0)
MCHC: 33.7 g/dL (ref 32.0–36.0)
MCV: 91.2 fL (ref 80.0–100.0)
Monocytes Absolute: 0.6 10*3/uL (ref 0.2–1.0)
Monocytes Relative: 9 %
NEUTROS ABS: 4.7 10*3/uL (ref 1.4–6.5)
Neutrophils Relative %: 74 %
PLATELETS: 179 10*3/uL (ref 150–440)
RBC: 4.84 MIL/uL (ref 4.40–5.90)
RDW: 16.9 % — ABNORMAL HIGH (ref 11.5–14.5)
WBC: 6.3 10*3/uL (ref 3.8–10.6)

## 2016-03-02 LAB — MAGNESIUM: Magnesium: 1.4 mg/dL — ABNORMAL LOW (ref 1.7–2.4)

## 2016-03-02 LAB — PHOSPHORUS: Phosphorus: 3 mg/dL (ref 2.5–4.6)

## 2016-04-08 ENCOUNTER — Other Ambulatory Visit
Admission: RE | Admit: 2016-04-08 | Discharge: 2016-04-08 | Disposition: A | Discharge status: Home / Self Care | Payer: Medicare Other | Source: Ambulatory Visit | Attending: Internal Medicine | Admitting: Internal Medicine

## 2016-04-08 DIAGNOSIS — N390 Urinary tract infection, site not specified: Secondary | ICD-10-CM | POA: Diagnosis not present

## 2016-04-08 DIAGNOSIS — Z09 Encounter for follow-up examination after completed treatment for conditions other than malignant neoplasm: Secondary | ICD-10-CM | POA: Insufficient documentation

## 2016-04-08 DIAGNOSIS — Z114 Encounter for screening for human immunodeficiency virus [HIV]: Secondary | ICD-10-CM | POA: Diagnosis not present

## 2016-04-08 DIAGNOSIS — Z940 Kidney transplant status: Secondary | ICD-10-CM | POA: Diagnosis present

## 2016-04-08 DIAGNOSIS — Z79899 Other long term (current) drug therapy: Secondary | ICD-10-CM | POA: Insufficient documentation

## 2016-04-08 DIAGNOSIS — D631 Anemia in chronic kidney disease: Secondary | ICD-10-CM | POA: Diagnosis not present

## 2016-04-08 DIAGNOSIS — T8610 Unspecified complication of kidney transplant: Secondary | ICD-10-CM | POA: Diagnosis not present

## 2016-04-08 DIAGNOSIS — E1129 Type 2 diabetes mellitus with other diabetic kidney complication: Secondary | ICD-10-CM | POA: Insufficient documentation

## 2016-04-08 DIAGNOSIS — E559 Vitamin D deficiency, unspecified: Secondary | ICD-10-CM | POA: Diagnosis not present

## 2016-04-08 DIAGNOSIS — Z789 Other specified health status: Secondary | ICD-10-CM | POA: Diagnosis not present

## 2016-04-08 LAB — CBC WITH DIFFERENTIAL/PLATELET
Basophils Absolute: 0 10*3/uL (ref 0–0.1)
Basophils Relative: 1 %
EOS ABS: 0.2 10*3/uL (ref 0–0.7)
Eosinophils Relative: 2 %
HEMATOCRIT: 47.9 % (ref 40.0–52.0)
HEMOGLOBIN: 16.3 g/dL (ref 13.0–18.0)
LYMPHS ABS: 0.8 10*3/uL — AB (ref 1.0–3.6)
LYMPHS PCT: 10 %
MCH: 31.7 pg (ref 26.0–34.0)
MCHC: 34 g/dL (ref 32.0–36.0)
MCV: 93.3 fL (ref 80.0–100.0)
MONOS PCT: 8 %
Monocytes Absolute: 0.6 10*3/uL (ref 0.2–1.0)
NEUTROS PCT: 79 %
Neutro Abs: 5.9 10*3/uL (ref 1.4–6.5)
Platelets: 183 10*3/uL (ref 150–440)
RBC: 5.14 MIL/uL (ref 4.40–5.90)
RDW: 16.6 % — ABNORMAL HIGH (ref 11.5–14.5)
WBC: 7.5 10*3/uL (ref 3.8–10.6)

## 2016-04-08 LAB — BASIC METABOLIC PANEL
Anion gap: 7 (ref 5–15)
BUN: 17 mg/dL (ref 6–20)
CO2: 26 mmol/L (ref 22–32)
Calcium: 9.7 mg/dL (ref 8.9–10.3)
Chloride: 108 mmol/L (ref 101–111)
Creatinine, Ser: 1.15 mg/dL (ref 0.61–1.24)
GFR calc Af Amer: >60 mL/min (ref >60)
GLUCOSE: 107 mg/dL — AB (ref 65–99)
POTASSIUM: 4.1 mmol/L (ref 3.5–5.1)
Sodium: 141 mmol/L (ref 135–145)

## 2016-04-08 LAB — MAGNESIUM: Magnesium: 1.6 mg/dL — ABNORMAL LOW (ref 1.7–2.4)

## 2016-04-08 LAB — PHOSPHORUS: Phosphorus: 3.3 mg/dL (ref 2.5–4.6)

## 2016-05-07 ENCOUNTER — Other Ambulatory Visit
Admission: RE | Admit: 2016-05-07 | Discharge: 2016-05-07 | Disposition: A | Discharge status: Home / Self Care | Payer: Medicare Other | Source: Ambulatory Visit | Attending: Nephrology | Admitting: Nephrology

## 2016-05-07 DIAGNOSIS — N390 Urinary tract infection, site not specified: Secondary | ICD-10-CM | POA: Diagnosis not present

## 2016-05-07 DIAGNOSIS — D631 Anemia in chronic kidney disease: Secondary | ICD-10-CM | POA: Diagnosis not present

## 2016-05-07 DIAGNOSIS — Z940 Kidney transplant status: Secondary | ICD-10-CM | POA: Diagnosis present

## 2016-05-07 DIAGNOSIS — Z79899 Other long term (current) drug therapy: Secondary | ICD-10-CM | POA: Diagnosis not present

## 2016-05-07 DIAGNOSIS — E559 Vitamin D deficiency, unspecified: Secondary | ICD-10-CM | POA: Diagnosis not present

## 2016-05-07 DIAGNOSIS — Z789 Other specified health status: Secondary | ICD-10-CM | POA: Insufficient documentation

## 2016-05-07 DIAGNOSIS — Z114 Encounter for screening for human immunodeficiency virus [HIV]: Secondary | ICD-10-CM | POA: Insufficient documentation

## 2016-05-07 DIAGNOSIS — E1129 Type 2 diabetes mellitus with other diabetic kidney complication: Secondary | ICD-10-CM | POA: Diagnosis not present

## 2016-05-07 LAB — CBC WITH DIFFERENTIAL/PLATELET
BASOS PCT: 1 %
Basophils Absolute: 0 10*3/uL (ref 0–0.1)
EOS ABS: 0.2 10*3/uL (ref 0–0.7)
EOS PCT: 3 %
HCT: 49.7 % (ref 40.0–52.0)
HEMOGLOBIN: 16.8 g/dL (ref 13.0–18.0)
Lymphocytes Relative: 13 %
Lymphs Abs: 1 10*3/uL (ref 1.0–3.6)
MCH: 31.6 pg (ref 26.0–34.0)
MCHC: 33.9 g/dL (ref 32.0–36.0)
MCV: 93.4 fL (ref 80.0–100.0)
Monocytes Absolute: 0.6 10*3/uL (ref 0.2–1.0)
Monocytes Relative: 9 %
NEUTROS PCT: 74 %
Neutro Abs: 5.5 10*3/uL (ref 1.4–6.5)
PLATELETS: 201 10*3/uL (ref 150–440)
RBC: 5.32 MIL/uL (ref 4.40–5.90)
RDW: 16 % — ABNORMAL HIGH (ref 11.5–14.5)
WBC: 7.4 10*3/uL (ref 3.8–10.6)

## 2016-05-07 LAB — BASIC METABOLIC PANEL
ANION GAP: 10 (ref 5–15)
BUN: 17 mg/dL (ref 6–20)
CALCIUM: 9.5 mg/dL (ref 8.9–10.3)
CO2: 21 mmol/L — ABNORMAL LOW (ref 22–32)
Chloride: 109 mmol/L (ref 101–111)
Creatinine, Ser: 1.04 mg/dL (ref 0.61–1.24)
Glucose, Bld: 108 mg/dL — ABNORMAL HIGH (ref 65–99)
POTASSIUM: 3.8 mmol/L (ref 3.5–5.1)
SODIUM: 140 mmol/L (ref 135–145)

## 2016-05-07 LAB — MAGNESIUM: Magnesium: 1.6 mg/dL — ABNORMAL LOW (ref 1.7–2.4)

## 2016-05-07 LAB — PHOSPHORUS: PHOSPHORUS: 3.2 mg/dL (ref 2.5–4.6)

## 2016-06-03 ENCOUNTER — Encounter: Payer: Self-pay | Admitting: *Deleted

## 2016-06-04 ENCOUNTER — Encounter: Payer: Self-pay | Admitting: *Deleted

## 2016-06-08 NOTE — Discharge Instructions (Signed)

## 2016-06-11 ENCOUNTER — Encounter
Admission: RE | Disposition: A | Discharge status: Home / Self Care | Payer: Self-pay | Source: Ambulatory Visit | Attending: Unknown Physician Specialty

## 2016-06-11 ENCOUNTER — Ambulatory Visit: Payer: Medicare Other | Admitting: Student in an Organized Health Care Education/Training Program

## 2016-06-11 ENCOUNTER — Encounter: Payer: Self-pay | Admitting: Anesthesiology

## 2016-06-11 ENCOUNTER — Ambulatory Visit
Admission: RE | Admit: 2016-06-11 | Discharge: 2016-06-11 | Disposition: A | Discharge status: Home / Self Care | Payer: Medicare Other | Source: Ambulatory Visit | Attending: Unknown Physician Specialty | Admitting: Unknown Physician Specialty

## 2016-06-11 DIAGNOSIS — K219 Gastro-esophageal reflux disease without esophagitis: Secondary | ICD-10-CM | POA: Diagnosis not present

## 2016-06-11 DIAGNOSIS — N186 End stage renal disease: Secondary | ICD-10-CM | POA: Insufficient documentation

## 2016-06-11 DIAGNOSIS — E785 Hyperlipidemia, unspecified: Secondary | ICD-10-CM | POA: Diagnosis not present

## 2016-06-11 DIAGNOSIS — L728 Other follicular cysts of the skin and subcutaneous tissue: Secondary | ICD-10-CM | POA: Diagnosis present

## 2016-06-11 DIAGNOSIS — Z8249 Family history of ischemic heart disease and other diseases of the circulatory system: Secondary | ICD-10-CM | POA: Diagnosis not present

## 2016-06-11 DIAGNOSIS — Z940 Kidney transplant status: Secondary | ICD-10-CM | POA: Insufficient documentation

## 2016-06-11 DIAGNOSIS — Z87891 Personal history of nicotine dependence: Secondary | ICD-10-CM | POA: Insufficient documentation

## 2016-06-11 DIAGNOSIS — J45909 Unspecified asthma, uncomplicated: Secondary | ICD-10-CM | POA: Diagnosis not present

## 2016-06-11 DIAGNOSIS — I120 Hypertensive chronic kidney disease with stage 5 chronic kidney disease or end stage renal disease: Secondary | ICD-10-CM | POA: Insufficient documentation

## 2016-06-11 DIAGNOSIS — I480 Paroxysmal atrial fibrillation: Secondary | ICD-10-CM | POA: Diagnosis not present

## 2016-06-11 HISTORY — DX: Unspecified atrial fibrillation: I48.91

## 2016-06-11 HISTORY — PX: CYST EXCISION: SHX5701

## 2016-06-11 SURGERY — CYST REMOVAL
Anesthesia: General | Site: Face | Laterality: Left | Wound class: Clean

## 2016-06-11 MED ORDER — LACTATED RINGERS IV SOLN
INTRAVENOUS | Status: DC
  Administered 2016-06-11: 09:00:00 via INTRAVENOUS

## 2016-06-11 MED ORDER — LIDOCAINE-EPINEPHRINE 1 %-1:100000 IJ SOLN
INTRAMUSCULAR | Status: DC | PRN
  Administered 2016-06-11: 2 mL

## 2016-06-11 MED ORDER — OXYCODONE-ACETAMINOPHEN 5-325 MG PO TABS: 1.0000 | ORAL_TABLET | ORAL | 0 refills | Status: DC | PRN

## 2016-06-11 MED ORDER — FENTANYL CITRATE (PF) 100 MCG/2ML IJ SOLN
INTRAMUSCULAR | Status: DC | PRN
  Administered 2016-06-11: 100 ug via INTRAVENOUS

## 2016-06-11 MED ORDER — OXYCODONE HCL 5 MG PO TABS: 5.0000 mg | ORAL_TABLET | Freq: Once | ORAL | Status: DC | PRN

## 2016-06-11 MED ORDER — ONDANSETRON HCL 4 MG/2ML IJ SOLN
INTRAMUSCULAR | Status: DC | PRN
  Administered 2016-06-11: 4 mg via INTRAVENOUS

## 2016-06-11 MED ORDER — BACITRACIN 500 UNIT/GM EX OINT
TOPICAL_OINTMENT | CUTANEOUS | Status: DC | PRN
  Administered 2016-06-11: 1 via TOPICAL

## 2016-06-11 MED ORDER — MIDAZOLAM HCL 5 MG/5ML IJ SOLN
INTRAMUSCULAR | Status: DC | PRN
  Administered 2016-06-11: 2 mg via INTRAVENOUS

## 2016-06-11 MED ORDER — PROPOFOL 10 MG/ML IV BOLUS
INTRAVENOUS | Status: DC | PRN
  Administered 2016-06-11: 200 mg via INTRAVENOUS

## 2016-06-11 MED ORDER — OXYCODONE HCL 5 MG/5ML PO SOLN: 5.0000 mg | Freq: Once | ORAL | Status: DC | PRN

## 2016-06-11 MED ORDER — DEXAMETHASONE SODIUM PHOSPHATE 4 MG/ML IJ SOLN
INTRAMUSCULAR | Status: DC | PRN
  Administered 2016-06-11: 4 mg via INTRAVENOUS

## 2016-06-11 MED ORDER — LIDOCAINE HCL (CARDIAC) 20 MG/ML IV SOLN
INTRAVENOUS | Status: DC | PRN
  Administered 2016-06-11: 50 mg via INTRATRACHEAL

## 2016-06-11 SURGICAL SUPPLY — 29 items
APPLICATOR COTTON TIP WD 3 STR (MISCELLANEOUS) IMPLANT
BLADE SURG 15 STRL LF DISP TIS (BLADE) IMPLANT
BLADE SURG 15 STRL SS (BLADE)
CANISTER SUCT 1200ML W/VALVE (MISCELLANEOUS) IMPLANT
CORD BIP STRL DISP 12FT (MISCELLANEOUS) ×3 IMPLANT
DRAPE HEAD BAR (DRAPES) ×3 IMPLANT
DRESSING TELFA 4X3 1S ST N-ADH (GAUZE/BANDAGES/DRESSINGS) IMPLANT
ELECT CAUTERY BLADE TIP 2.5 (TIP)
ELECT CAUTERY NEEDLE 2.0 MIC (NEEDLE) IMPLANT
ELECTRODE CAUTERY BLDE TIP 2.5 (TIP) IMPLANT
GAUZE SPONGE 4X4 12PLY STRL (GAUZE/BANDAGES/DRESSINGS) IMPLANT
GLOVE BIO SURGEON STRL SZ7.5 (GLOVE) ×6 IMPLANT
KIT ROOM TURNOVER OR (KITS) ×3 IMPLANT
LIQUID BAND (GAUZE/BANDAGES/DRESSINGS) IMPLANT
NEEDLE HYPO 25GX1X1/2 BEV (NEEDLE) ×3 IMPLANT
NS IRRIG 500ML POUR BTL (IV SOLUTION) ×3 IMPLANT
PACK DRAPE NASAL/ENT (PACKS) ×3 IMPLANT
PAD GROUND ADULT SPLIT (MISCELLANEOUS) ×3 IMPLANT
PENCIL ELECTRO HAND CTR (MISCELLANEOUS) IMPLANT
SOL PREP PVP 2OZ (MISCELLANEOUS) ×3
SOLUTION PREP PVP 2OZ (MISCELLANEOUS) ×1 IMPLANT
STRAP BODY AND KNEE 60X3 (MISCELLANEOUS) ×6 IMPLANT
SUCTION FRAZIER TIP 10 FR DISP (SUCTIONS) IMPLANT
SUT PROLENE 5 0 P 3 (SUTURE) ×3 IMPLANT
SUT VIC AB 4-0 RB1 27 (SUTURE)
SUT VIC AB 4-0 RB1 27X BRD (SUTURE) IMPLANT
SUT VIC AB 5-0 PC1 18 (SUTURE) ×3 IMPLANT
SYRINGE 10CC LL (SYRINGE) ×3 IMPLANT
TOWEL OR 17X26 4PK STRL BLUE (TOWEL DISPOSABLE) ×3 IMPLANT

## 2016-06-11 NOTE — Transfer of Care (Signed)
Immediate Anesthesia Transfer of Care Note  Patient: Dave [name]  Procedure(s) Performed: Procedure(s) with comments: CYST REMOVAL (Left) - Left upper forehead  Patient Location: PACU  Anesthesia Type: General  Level of Consciousness: awake, alert  and patient cooperative  Airway and Oxygen Therapy: Patient Spontanous Breathing and Patient connected to supplemental oxygen  Post-op Assessment: Post-op Vital signs reviewed, Patient's Cardiovascular Status Stable, Respiratory Function Stable, Patent Airway and No signs of Nausea or vomiting  Post-op Vital Signs: Reviewed and stable  Complications: No apparent anesthesia complications

## 2016-06-11 NOTE — Op Note (Signed)
06/11/2016  10:30 AM    David StallWarren, Tanvir  [id number]   Pre-Op Dx: FACIAL CYST  Post-op Dx: SAME  Proc: Excision left eyebrow inclusion cyst   Surg:  Linus SalmonsMCQUEEN,Kellene Mccleary T  Anes:  GOT  EBL:  Less than 5 cc  Comp:  None  Findings:  1 cm firm subcutaneous mass  Procedure: Winter was identified in the holding area taken to the operating room placed in supine position. Laryngeal mask anesthesia the left upper face and forehead were prepped and draped in the usual fashion. A local anesthetic of 1% lidocaine with 1 100,000 units of epinephrine was used to inject overlying the mass a total of 1/2 cc was used. A skin incision was made through a natural skin crease this was made down to and into the subcutaneous tissues. Immediately a firm white subcutaneous mass was identified. This was dissected free from all surrounding tissues using short sharp scissors and the microbipolar. It measured approximately 1 x 1 cm. With the mass removed the wound was copious irrigated with saline the deep layer was closed using interrupted 5-0 Vicryl and skin was closed using running 5-0 Prolene. The patient was in return anesthesia where he was awakened in the operating room taken recovery room stable condition.  Cultures: None  Specimens: Left for head mass  Dispo:   Good  Plan:  Discharge to home follow-up 10 days for suture removal  Margaretann Abate T  06/11/2016 10:30 AM

## 2016-06-11 NOTE — Anesthesia Procedure Notes (Signed)
Procedure Name: LMA Insertion Date/Time: 06/11/2016 10:03 AM Performed by: Andee PolesBUSH, Tylin Stradley Pre-anesthesia Checklist: Patient identified, Emergency Drugs available, Suction available, Timeout performed and Patient being monitored Patient Re-evaluated:Patient Re-evaluated prior to inductionOxygen Delivery Method: Circle system utilized Preoxygenation: Pre-oxygenation with 100% oxygen Intubation Type: IV induction LMA: LMA inserted LMA Size: 4.0 Number of attempts: 1 Placement Confirmation: positive ETCO2 and breath sounds checked- equal and bilateral Tube secured with: Tape

## 2016-06-11 NOTE — Anesthesia Postprocedure Evaluation (Signed)
Anesthesia Post Note  Patient: Dave [name]  Procedure(s) Performed: Procedure(s) (LRB): CYST REMOVAL (Left)  Patient location during evaluation: PACU Anesthesia Type: General Level of consciousness: awake and alert Pain management: pain level controlled Vital Signs Assessment: post-procedure vital signs reviewed and stable Respiratory status: spontaneous breathing, nonlabored ventilation, respiratory function stable and patient connected to nasal cannula oxygen Cardiovascular status: blood pressure returned to baseline and stable Postop Assessment: no signs of nausea or vomiting Anesthetic complications: no    Roderic Lammert

## 2016-06-11 NOTE — Anesthesia Preprocedure Evaluation (Signed)
Anesthesia Evaluation  Patient identified by MRN, date of birth, ID band  Reviewed: NPO status   Airway Mallampati: II  TM Distance: >3 FB Neck ROM: full    Dental no notable dental hx.    Pulmonary asthma , former smoker,    Pulmonary exam normal        Cardiovascular Exercise Tolerance: Good hypertension, Normal cardiovascular exam+ dysrhythmias (parox. Afib.) Atrial Fibrillation      Neuro/Psych Depression negative neurological ROS     GI/Hepatic Neg liver ROS, GERD  Controlled,  Endo/Other  negative endocrine ROS  Renal/GU Renal disease (kidney transplant 05/2014)  negative genitourinary   Musculoskeletal   Abdominal   Peds  Hematology negative hematology ROS (+)   Anesthesia Other Findings Decadron IV intraop.  Reproductive/Obstetrics                             Anesthesia Physical Anesthesia Plan  ASA: II  Anesthesia Plan: General   Post-op Pain Management:    Induction:   Airway Management Planned:   Additional Equipment:   Intra-op Plan:   Post-operative Plan:   Informed Consent: I have reviewed the patients History and Physical, chart, labs and discussed the procedure including the risks, benefits and alternatives for the proposed anesthesia with the patient or authorized representative who has indicated his/her understanding and acceptance.     Plan Discussed with: CRNA  Anesthesia Plan Comments: (S/p kidney transplant 2015. On steroids and immunosuppression meds. Give decadron intraop.)        Anesthesia Quick Evaluation

## 2016-06-11 NOTE — H&P (Signed)
  H+P  Reviewed and will be scanned in later. No changes noted. 

## 2016-06-14 ENCOUNTER — Encounter: Payer: Self-pay | Admitting: Unknown Physician Specialty

## 2016-06-15 LAB — SURGICAL PATHOLOGY

## 2016-07-29 ENCOUNTER — Other Ambulatory Visit
Admission: RE | Admit: 2016-07-29 | Discharge: 2016-07-29 | Disposition: A | Discharge status: Home / Self Care | Payer: Medicare Other | Source: Ambulatory Visit | Attending: Nephrology | Admitting: Nephrology

## 2016-07-29 DIAGNOSIS — Z789 Other specified health status: Secondary | ICD-10-CM | POA: Insufficient documentation

## 2016-07-29 DIAGNOSIS — Z114 Encounter for screening for human immunodeficiency virus [HIV]: Secondary | ICD-10-CM | POA: Insufficient documentation

## 2016-07-29 DIAGNOSIS — Z940 Kidney transplant status: Secondary | ICD-10-CM | POA: Diagnosis present

## 2016-07-29 DIAGNOSIS — T8610 Unspecified complication of kidney transplant: Secondary | ICD-10-CM | POA: Insufficient documentation

## 2016-07-29 DIAGNOSIS — D631 Anemia in chronic kidney disease: Secondary | ICD-10-CM | POA: Diagnosis not present

## 2016-07-29 DIAGNOSIS — E1129 Type 2 diabetes mellitus with other diabetic kidney complication: Secondary | ICD-10-CM | POA: Insufficient documentation

## 2016-07-29 DIAGNOSIS — N189 Chronic kidney disease, unspecified: Secondary | ICD-10-CM | POA: Insufficient documentation

## 2016-07-29 DIAGNOSIS — E559 Vitamin D deficiency, unspecified: Secondary | ICD-10-CM | POA: Insufficient documentation

## 2016-07-29 DIAGNOSIS — Z09 Encounter for follow-up examination after completed treatment for conditions other than malignant neoplasm: Secondary | ICD-10-CM | POA: Insufficient documentation

## 2016-07-29 DIAGNOSIS — Z79899 Other long term (current) drug therapy: Secondary | ICD-10-CM | POA: Diagnosis not present

## 2016-07-29 DIAGNOSIS — N390 Urinary tract infection, site not specified: Secondary | ICD-10-CM | POA: Insufficient documentation

## 2016-07-29 LAB — PHOSPHORUS: PHOSPHORUS: 3.3 mg/dL (ref 2.5–4.6)

## 2016-07-29 LAB — CBC WITH DIFFERENTIAL/PLATELET
BASOS ABS: 0 10*3/uL (ref 0–0.1)
BASOS PCT: 0 %
Eosinophils Absolute: 0.3 10*3/uL (ref 0–0.7)
Eosinophils Relative: 3 %
HEMATOCRIT: 48.5 % (ref 40.0–52.0)
Hemoglobin: 16.6 g/dL (ref 13.0–18.0)
Lymphocytes Relative: 12 %
Lymphs Abs: 1 10*3/uL (ref 1.0–3.6)
MCH: 31.3 pg (ref 26.0–34.0)
MCHC: 34.2 g/dL (ref 32.0–36.0)
MCV: 91.5 fL (ref 80.0–100.0)
MONO ABS: 0.7 10*3/uL (ref 0.2–1.0)
MONOS PCT: 9 %
NEUTROS ABS: 6.4 10*3/uL (ref 1.4–6.5)
Neutrophils Relative %: 76 %
PLATELETS: 183 10*3/uL (ref 150–440)
RBC: 5.31 MIL/uL (ref 4.40–5.90)
RDW: 15.8 % — AB (ref 11.5–14.5)
WBC: 8.5 10*3/uL (ref 3.8–10.6)

## 2016-07-29 LAB — BASIC METABOLIC PANEL
Anion gap: 7 (ref 5–15)
BUN: 16 mg/dL (ref 6–20)
CALCIUM: 9.7 mg/dL (ref 8.9–10.3)
CO2: 24 mmol/L (ref 22–32)
CREATININE: 1.06 mg/dL (ref 0.61–1.24)
Chloride: 109 mmol/L (ref 101–111)
Glucose, Bld: 106 mg/dL — ABNORMAL HIGH (ref 65–99)
Potassium: 4 mmol/L (ref 3.5–5.1)
SODIUM: 140 mmol/L (ref 135–145)

## 2016-07-29 LAB — MAGNESIUM: MAGNESIUM: 1.5 mg/dL — AB (ref 1.7–2.4)

## 2016-09-03 ENCOUNTER — Other Ambulatory Visit
Admission: RE | Admit: 2016-09-03 | Discharge: 2016-09-03 | Disposition: A | Discharge status: Home / Self Care | Payer: Medicare Other | Source: Ambulatory Visit | Attending: Nephrology | Admitting: Nephrology

## 2016-09-03 DIAGNOSIS — Z940 Kidney transplant status: Secondary | ICD-10-CM | POA: Insufficient documentation

## 2016-09-03 DIAGNOSIS — Z79899 Other long term (current) drug therapy: Secondary | ICD-10-CM | POA: Insufficient documentation

## 2016-09-03 DIAGNOSIS — E1129 Type 2 diabetes mellitus with other diabetic kidney complication: Secondary | ICD-10-CM | POA: Insufficient documentation

## 2016-09-03 DIAGNOSIS — E559 Vitamin D deficiency, unspecified: Secondary | ICD-10-CM | POA: Diagnosis not present

## 2016-09-03 LAB — CBC WITH DIFFERENTIAL/PLATELET
BASOS PCT: 0 %
Basophils Absolute: 0 10*3/uL (ref 0–0.1)
EOS ABS: 0.3 10*3/uL (ref 0–0.7)
Eosinophils Relative: 3 %
HEMATOCRIT: 47.1 % (ref 40.0–52.0)
HEMOGLOBIN: 15.9 g/dL (ref 13.0–18.0)
LYMPHS ABS: 1 10*3/uL (ref 1.0–3.6)
Lymphocytes Relative: 12 %
MCH: 31.5 pg (ref 26.0–34.0)
MCHC: 33.9 g/dL (ref 32.0–36.0)
MCV: 92.9 fL (ref 80.0–100.0)
MONO ABS: 0.8 10*3/uL (ref 0.2–1.0)
MONOS PCT: 10 %
NEUTROS ABS: 6.4 10*3/uL (ref 1.4–6.5)
NEUTROS PCT: 75 %
Platelets: 202 10*3/uL (ref 150–440)
RBC: 5.07 MIL/uL (ref 4.40–5.90)
RDW: 16.1 % — AB (ref 11.5–14.5)
WBC: 8.5 10*3/uL (ref 3.8–10.6)

## 2016-09-03 LAB — BASIC METABOLIC PANEL
Anion gap: 3 — ABNORMAL LOW (ref 5–15)
BUN: 16 mg/dL (ref 6–20)
CO2: 28 mmol/L (ref 22–32)
CREATININE: 1.1 mg/dL (ref 0.61–1.24)
Calcium: 9.4 mg/dL (ref 8.9–10.3)
Chloride: 106 mmol/L (ref 101–111)
GFR calc Af Amer: >60 mL/min (ref >60)
GLUCOSE: 99 mg/dL (ref 65–99)
Potassium: 3.9 mmol/L (ref 3.5–5.1)
SODIUM: 137 mmol/L (ref 135–145)

## 2016-09-03 LAB — MAGNESIUM: Magnesium: 1.5 mg/dL — ABNORMAL LOW (ref 1.7–2.4)

## 2016-09-03 LAB — PHOSPHORUS: PHOSPHORUS: 3.5 mg/dL (ref 2.5–4.6)

## 2016-10-20 ENCOUNTER — Other Ambulatory Visit
Admission: RE | Admit: 2016-10-20 | Discharge: 2016-10-20 | Disposition: A | Discharge status: Home / Self Care | Payer: Medicare Other | Source: Ambulatory Visit | Attending: Nephrology | Admitting: Nephrology

## 2016-10-20 DIAGNOSIS — Z114 Encounter for screening for human immunodeficiency virus [HIV]: Secondary | ICD-10-CM | POA: Insufficient documentation

## 2016-10-20 DIAGNOSIS — E559 Vitamin D deficiency, unspecified: Secondary | ICD-10-CM | POA: Diagnosis not present

## 2016-10-20 DIAGNOSIS — E1129 Type 2 diabetes mellitus with other diabetic kidney complication: Secondary | ICD-10-CM | POA: Diagnosis not present

## 2016-10-20 DIAGNOSIS — Z79899 Other long term (current) drug therapy: Secondary | ICD-10-CM | POA: Insufficient documentation

## 2016-10-20 DIAGNOSIS — N390 Urinary tract infection, site not specified: Secondary | ICD-10-CM | POA: Diagnosis not present

## 2016-10-20 DIAGNOSIS — Z789 Other specified health status: Secondary | ICD-10-CM | POA: Insufficient documentation

## 2016-10-20 DIAGNOSIS — D631 Anemia in chronic kidney disease: Secondary | ICD-10-CM | POA: Insufficient documentation

## 2016-10-20 DIAGNOSIS — Z940 Kidney transplant status: Secondary | ICD-10-CM | POA: Diagnosis present

## 2016-10-20 LAB — CBC WITH DIFFERENTIAL/PLATELET
BASOS PCT: 1 %
Basophils Absolute: 0 10*3/uL (ref 0–0.1)
EOS ABS: 0.3 10*3/uL (ref 0–0.7)
Eosinophils Relative: 3 %
HEMATOCRIT: 46.5 % (ref 40.0–52.0)
HEMOGLOBIN: 16.2 g/dL (ref 13.0–18.0)
LYMPHS ABS: 0.9 10*3/uL — AB (ref 1.0–3.6)
Lymphocytes Relative: 10 %
MCH: 32.4 pg (ref 26.0–34.0)
MCHC: 34.8 g/dL (ref 32.0–36.0)
MCV: 93.1 fL (ref 80.0–100.0)
MONOS PCT: 8 %
Monocytes Absolute: 0.7 10*3/uL (ref 0.2–1.0)
NEUTROS ABS: 6.4 10*3/uL (ref 1.4–6.5)
NEUTROS PCT: 78 %
Platelets: 202 10*3/uL (ref 150–440)
RBC: 5 MIL/uL (ref 4.40–5.90)
RDW: 15.4 % — ABNORMAL HIGH (ref 11.5–14.5)
WBC: 8.3 10*3/uL (ref 3.8–10.6)

## 2016-10-20 LAB — BASIC METABOLIC PANEL
ANION GAP: 7 (ref 5–15)
BUN: 17 mg/dL (ref 6–20)
CO2: 24 mmol/L (ref 22–32)
Calcium: 9.1 mg/dL (ref 8.9–10.3)
Chloride: 107 mmol/L (ref 101–111)
Creatinine, Ser: 1.04 mg/dL (ref 0.61–1.24)
GFR calc non Af Amer: >60 mL/min (ref >60)
Glucose, Bld: 102 mg/dL — ABNORMAL HIGH (ref 65–99)
Potassium: 3.9 mmol/L (ref 3.5–5.1)
Sodium: 138 mmol/L (ref 135–145)

## 2016-10-20 LAB — MAGNESIUM: Magnesium: 1.5 mg/dL — ABNORMAL LOW (ref 1.7–2.4)

## 2016-10-20 LAB — PHOSPHORUS: Phosphorus: 3 mg/dL (ref 2.5–4.6)

## 2017-01-10 ENCOUNTER — Other Ambulatory Visit
Admission: RE | Admit: 2017-01-10 | Discharge: 2017-01-10 | Disposition: A | Discharge status: Home / Self Care | Payer: Medicare Other | Source: Ambulatory Visit | Attending: Nephrology | Admitting: Nephrology

## 2017-01-10 DIAGNOSIS — E559 Vitamin D deficiency, unspecified: Secondary | ICD-10-CM | POA: Insufficient documentation

## 2017-01-10 DIAGNOSIS — D631 Anemia in chronic kidney disease: Secondary | ICD-10-CM | POA: Insufficient documentation

## 2017-01-10 DIAGNOSIS — Z79899 Other long term (current) drug therapy: Secondary | ICD-10-CM | POA: Diagnosis not present

## 2017-01-10 DIAGNOSIS — Z940 Kidney transplant status: Secondary | ICD-10-CM | POA: Insufficient documentation

## 2017-01-10 DIAGNOSIS — Z09 Encounter for follow-up examination after completed treatment for conditions other than malignant neoplasm: Secondary | ICD-10-CM | POA: Insufficient documentation

## 2017-01-10 DIAGNOSIS — N390 Urinary tract infection, site not specified: Secondary | ICD-10-CM | POA: Diagnosis present

## 2017-01-10 LAB — BASIC METABOLIC PANEL
Anion gap: 8 (ref 5–15)
BUN: 15 mg/dL (ref 6–20)
CO2: 24 mmol/L (ref 22–32)
CREATININE: 0.97 mg/dL (ref 0.61–1.24)
Calcium: 9.5 mg/dL (ref 8.9–10.3)
Chloride: 106 mmol/L (ref 101–111)
Glucose, Bld: 101 mg/dL — ABNORMAL HIGH (ref 65–99)
Potassium: 4 mmol/L (ref 3.5–5.1)
Sodium: 138 mmol/L (ref 135–145)

## 2017-01-10 LAB — CBC WITH DIFFERENTIAL/PLATELET
BASOS ABS: 0 10*3/uL (ref 0–0.1)
BASOS PCT: 0 %
EOS ABS: 0.3 10*3/uL (ref 0–0.7)
EOS PCT: 3 %
HCT: 49 % (ref 40.0–52.0)
Hemoglobin: 16.3 g/dL (ref 13.0–18.0)
Lymphocytes Relative: 11 %
Lymphs Abs: 0.9 10*3/uL — ABNORMAL LOW (ref 1.0–3.6)
MCH: 31.3 pg (ref 26.0–34.0)
MCHC: 33.3 g/dL (ref 32.0–36.0)
MCV: 93.8 fL (ref 80.0–100.0)
MONO ABS: 0.8 10*3/uL (ref 0.2–1.0)
Monocytes Relative: 9 %
Neutro Abs: 6.7 10*3/uL — ABNORMAL HIGH (ref 1.4–6.5)
Neutrophils Relative %: 77 %
PLATELETS: 211 10*3/uL (ref 150–440)
RBC: 5.22 MIL/uL (ref 4.40–5.90)
RDW: 15.8 % — AB (ref 11.5–14.5)
WBC: 8.7 10*3/uL (ref 3.8–10.6)

## 2017-01-10 LAB — PHOSPHORUS: PHOSPHORUS: 3.5 mg/dL (ref 2.5–4.6)

## 2017-01-10 LAB — MAGNESIUM: MAGNESIUM: 1.5 mg/dL — AB (ref 1.7–2.4)

## 2017-03-04 ENCOUNTER — Encounter
Admission: RE | Admit: 2017-03-04 | Discharge: 2017-03-04 | Disposition: A | Discharge status: Home / Self Care | Payer: Medicare Other | Source: Ambulatory Visit | Attending: Nephrology | Admitting: Nephrology

## 2017-03-04 DIAGNOSIS — Z79899 Other long term (current) drug therapy: Secondary | ICD-10-CM | POA: Insufficient documentation

## 2017-03-04 DIAGNOSIS — D899 Disorder involving the immune mechanism, unspecified: Secondary | ICD-10-CM | POA: Diagnosis present

## 2017-03-04 DIAGNOSIS — Z09 Encounter for follow-up examination after completed treatment for conditions other than malignant neoplasm: Secondary | ICD-10-CM | POA: Insufficient documentation

## 2017-03-04 DIAGNOSIS — Z114 Encounter for screening for human immunodeficiency virus [HIV]: Secondary | ICD-10-CM | POA: Insufficient documentation

## 2017-03-04 DIAGNOSIS — Z789 Other specified health status: Secondary | ICD-10-CM | POA: Diagnosis not present

## 2017-03-04 DIAGNOSIS — Z9483 Pancreas transplant status: Secondary | ICD-10-CM | POA: Insufficient documentation

## 2017-03-04 DIAGNOSIS — D631 Anemia in chronic kidney disease: Secondary | ICD-10-CM | POA: Insufficient documentation

## 2017-03-04 DIAGNOSIS — E1129 Type 2 diabetes mellitus with other diabetic kidney complication: Secondary | ICD-10-CM | POA: Diagnosis not present

## 2017-03-04 DIAGNOSIS — N189 Chronic kidney disease, unspecified: Secondary | ICD-10-CM | POA: Diagnosis not present

## 2017-03-04 DIAGNOSIS — N390 Urinary tract infection, site not specified: Secondary | ICD-10-CM | POA: Insufficient documentation

## 2017-03-04 DIAGNOSIS — Z940 Kidney transplant status: Secondary | ICD-10-CM | POA: Insufficient documentation

## 2017-03-04 DIAGNOSIS — B259 Cytomegaloviral disease, unspecified: Secondary | ICD-10-CM | POA: Insufficient documentation

## 2017-03-04 LAB — CBC WITH DIFFERENTIAL/PLATELET
Basophils Absolute: 0.1 10*3/uL (ref 0–0.1)
Basophils Relative: 1 %
EOS PCT: 2 %
Eosinophils Absolute: 0.1 10*3/uL (ref 0–0.7)
HCT: 50 % (ref 40.0–52.0)
Hemoglobin: 16.9 g/dL (ref 13.0–18.0)
LYMPHS PCT: 11 %
Lymphs Abs: 0.9 10*3/uL — ABNORMAL LOW (ref 1.0–3.6)
MCH: 31.2 pg (ref 26.0–34.0)
MCHC: 33.8 g/dL (ref 32.0–36.0)
MCV: 92.4 fL (ref 80.0–100.0)
MONO ABS: 0.6 10*3/uL (ref 0.2–1.0)
Monocytes Relative: 8 %
Neutro Abs: 6.2 10*3/uL (ref 1.4–6.5)
Neutrophils Relative %: 78 %
PLATELETS: 241 10*3/uL (ref 150–440)
RBC: 5.41 MIL/uL (ref 4.40–5.90)
RDW: 16 % — AB (ref 11.5–14.5)
WBC: 8 10*3/uL (ref 3.8–10.6)

## 2017-03-04 LAB — PHOSPHORUS: Phosphorus: 3.5 mg/dL (ref 2.5–4.6)

## 2017-03-04 LAB — BASIC METABOLIC PANEL
Anion gap: 7 (ref 5–15)
BUN: 15 mg/dL (ref 6–20)
CALCIUM: 9.7 mg/dL (ref 8.9–10.3)
CO2: 27 mmol/L (ref 22–32)
Chloride: 106 mmol/L (ref 101–111)
Creatinine, Ser: 0.93 mg/dL (ref 0.61–1.24)
GFR calc Af Amer: >60 mL/min (ref >60)
GFR calc non Af Amer: >60 mL/min (ref >60)
GLUCOSE: 124 mg/dL — AB (ref 65–99)
Potassium: 3.7 mmol/L (ref 3.5–5.1)
Sodium: 140 mmol/L (ref 135–145)

## 2017-03-04 LAB — MAGNESIUM: Magnesium: 1.4 mg/dL — ABNORMAL LOW (ref 1.7–2.4)

## 2017-04-06 MED FILL — PROGRAF/1MG/CAP: PROGRAF/1MG/CAP | 30 days supply | Qty: 150 | Fill #3

## 2017-05-03 ENCOUNTER — Ambulatory Visit
Admission: RE | Admit: 2017-05-03 | Discharge: 2017-05-03 | Disposition: A | Discharge status: Home / Self Care | Payer: MEDICARE

## 2017-05-03 ENCOUNTER — Ambulatory Visit
Admission: RE | Admit: 2017-05-03 | Discharge: 2017-05-03 | Disposition: A | Discharge status: Home / Self Care | Payer: MEDICARE | Attending: Nephrology | Admitting: Nephrology

## 2017-05-03 DIAGNOSIS — Z940 Kidney transplant status: Principal | ICD-10-CM

## 2017-05-03 DIAGNOSIS — D899 Disorder involving the immune mechanism, unspecified: Secondary | ICD-10-CM

## 2017-05-03 DIAGNOSIS — I480 Paroxysmal atrial fibrillation: Principal | ICD-10-CM

## 2017-05-03 DIAGNOSIS — Z79899 Other long term (current) drug therapy: Secondary | ICD-10-CM

## 2017-05-03 MED ORDER — PROGRAF 1 MG CAPSULE
ORAL_CAPSULE | 12 refills | 0 days
Start: 2017-05-03 — End: 2018-06-05

## 2017-05-03 MED ORDER — TACROLIMUS 1 MG CAPSULE
ORAL_CAPSULE | 11 refills | 0 days | Status: CP
Start: 2017-05-03 — End: 2017-05-03

## 2017-05-03 NOTE — Unmapped (Addendum)
duplicate note.

## 2017-05-09 MED FILL — PROGRAF/1MG/CAP: PROGRAF/1MG/CAP | 30 days supply | Qty: 120 | Fill #0

## 2017-05-16 ENCOUNTER — Ambulatory Visit
Admission: RE | Admit: 2017-05-16 | Discharge: 2017-05-16 | Disposition: A | Discharge status: Home / Self Care | Payer: MEDICARE

## 2017-05-16 DIAGNOSIS — Z79899 Other long term (current) drug therapy: Secondary | ICD-10-CM

## 2017-05-16 DIAGNOSIS — I10 Essential (primary) hypertension: Secondary | ICD-10-CM

## 2017-05-16 DIAGNOSIS — Z940 Kidney transplant status: Secondary | ICD-10-CM

## 2017-05-16 DIAGNOSIS — F329 Major depressive disorder, single episode, unspecified: Secondary | ICD-10-CM

## 2017-05-16 DIAGNOSIS — E559 Vitamin D deficiency, unspecified: Secondary | ICD-10-CM

## 2017-05-16 DIAGNOSIS — I480 Paroxysmal atrial fibrillation: Principal | ICD-10-CM

## 2017-05-16 MED ORDER — METOPROLOL SUCCINATE ER 25 MG TABLET,EXTENDED RELEASE 24 HR
ORAL_TABLET | Freq: Every day | ORAL | 11 refills | 0 days | Status: CP
Start: 2017-05-16 — End: 2017-06-08

## 2017-05-30 ENCOUNTER — Other Ambulatory Visit
Admission: RE | Admit: 2017-05-30 | Discharge: 2017-05-30 | Disposition: A | Discharge status: Home / Self Care | Payer: Medicare Other | Source: Ambulatory Visit | Attending: Nephrology | Admitting: Nephrology

## 2017-05-31 ENCOUNTER — Other Ambulatory Visit
Admission: RE | Admit: 2017-05-31 | Discharge: 2017-05-31 | Disposition: A | Discharge status: Home / Self Care | Payer: Medicare Other | Source: Ambulatory Visit | Attending: Psychiatry | Admitting: Psychiatry

## 2017-05-31 DIAGNOSIS — D631 Anemia in chronic kidney disease: Secondary | ICD-10-CM | POA: Insufficient documentation

## 2017-05-31 DIAGNOSIS — B259 Cytomegaloviral disease, unspecified: Secondary | ICD-10-CM | POA: Diagnosis not present

## 2017-05-31 DIAGNOSIS — D899 Disorder involving the immune mechanism, unspecified: Secondary | ICD-10-CM | POA: Insufficient documentation

## 2017-05-31 DIAGNOSIS — N390 Urinary tract infection, site not specified: Secondary | ICD-10-CM | POA: Insufficient documentation

## 2017-05-31 DIAGNOSIS — Z114 Encounter for screening for human immunodeficiency virus [HIV]: Secondary | ICD-10-CM | POA: Diagnosis not present

## 2017-05-31 DIAGNOSIS — E1129 Type 2 diabetes mellitus with other diabetic kidney complication: Secondary | ICD-10-CM | POA: Diagnosis not present

## 2017-05-31 DIAGNOSIS — Z789 Other specified health status: Secondary | ICD-10-CM | POA: Insufficient documentation

## 2017-05-31 DIAGNOSIS — Z79899 Other long term (current) drug therapy: Secondary | ICD-10-CM | POA: Diagnosis present

## 2017-05-31 DIAGNOSIS — Z940 Kidney transplant status: Secondary | ICD-10-CM | POA: Diagnosis present

## 2017-05-31 DIAGNOSIS — Z09 Encounter for follow-up examination after completed treatment for conditions other than malignant neoplasm: Secondary | ICD-10-CM | POA: Insufficient documentation

## 2017-05-31 DIAGNOSIS — Z9483 Pancreas transplant status: Secondary | ICD-10-CM | POA: Diagnosis not present

## 2017-05-31 LAB — CBC WITH DIFFERENTIAL/PLATELET
BASOS ABS: 0 10*3/uL (ref 0–0.1)
Basophils Relative: 0 %
EOS PCT: 3 %
Eosinophils Absolute: 0.2 10*3/uL (ref 0–0.7)
HEMATOCRIT: 46.9 % (ref 40.0–52.0)
Hemoglobin: 16 g/dL (ref 13.0–18.0)
LYMPHS ABS: 1.1 10*3/uL (ref 1.0–3.6)
LYMPHS PCT: 15 %
MCH: 31.5 pg (ref 26.0–34.0)
MCHC: 34.2 g/dL (ref 32.0–36.0)
MCV: 92 fL (ref 80.0–100.0)
MONO ABS: 0.6 10*3/uL (ref 0.2–1.0)
MONOS PCT: 9 %
NEUTROS ABS: 5.5 10*3/uL (ref 1.4–6.5)
Neutrophils Relative %: 73 %
PLATELETS: 214 10*3/uL (ref 150–440)
RBC: 5.1 MIL/uL (ref 4.40–5.90)
RDW: 15.6 % — AB (ref 11.5–14.5)
WBC: 7.5 10*3/uL (ref 3.8–10.6)

## 2017-05-31 LAB — MAGNESIUM: Magnesium: 1.5 mg/dL — ABNORMAL LOW (ref 1.7–2.4)

## 2017-05-31 LAB — BASIC METABOLIC PANEL
ANION GAP: 10 (ref 5–15)
BUN: 17 mg/dL (ref 6–20)
CO2: 24 mmol/L (ref 22–32)
Calcium: 9.4 mg/dL (ref 8.9–10.3)
Chloride: 105 mmol/L (ref 101–111)
Creatinine, Ser: 1.07 mg/dL (ref 0.61–1.24)
GFR calc Af Amer: >60 mL/min (ref >60)
GLUCOSE: 115 mg/dL — AB (ref 65–99)
POTASSIUM: 3.8 mmol/L (ref 3.5–5.1)
Sodium: 139 mmol/L (ref 135–145)

## 2017-05-31 LAB — PHOSPHORUS: Phosphorus: 3.2 mg/dL (ref 2.5–4.6)

## 2017-06-02 LAB — TACROLIMUS LEVEL: Tacrolimus (FK506) - LabCorp: 8.2 ng/mL (ref 2.0–20.0)

## 2017-06-07 MED FILL — PROGRAF/1MG/CAP: PROGRAF/1MG/CAP | 30 days supply | Qty: 120 | Fill #1

## 2017-06-08 MED ORDER — METOPROLOL SUCCINATE ER 50 MG TABLET,EXTENDED RELEASE 24 HR
ORAL_TABLET | Freq: Every day | ORAL | 11 refills | 0 days | Status: CP
Start: 2017-06-08 — End: 2018-06-08

## 2017-07-08 MED FILL — PROGRAF/1MG/CAP: PROGRAF/1MG/CAP | 30 days supply | Qty: 120 | Fill #0

## 2017-08-08 MED FILL — PROGRAF/1MG/CAP: PROGRAF/1MG/CAP | 30 days supply | Qty: 120 | Fill #0

## 2017-08-15 MED ORDER — DOXYCYCLINE HYCLATE 100 MG TABLET
ORAL_TABLET | Freq: Two times a day (BID) | ORAL | 0 refills | 0.00000 days | Status: CP
Start: 2017-08-15 — End: 2017-08-15

## 2017-08-15 MED ORDER — DOXYCYCLINE HYCLATE 100 MG TABLET: 100 mg | tablet | Freq: Two times a day (BID) | 0 refills | 0 days | Status: AC

## 2017-08-16 ENCOUNTER — Ambulatory Visit
Admission: RE | Admit: 2017-08-16 | Discharge: 2017-08-16 | Disposition: A | Discharge status: Home / Self Care | Payer: MEDICARE

## 2017-08-16 DIAGNOSIS — Z940 Kidney transplant status: Principal | ICD-10-CM

## 2017-08-16 DIAGNOSIS — T82898A Other specified complication of vascular prosthetic devices, implants and grafts, initial encounter: Secondary | ICD-10-CM

## 2017-08-29 MED ORDER — AZATHIOPRINE 50 MG TABLET
ORAL_TABLET | Freq: Every day | ORAL | 3 refills | 0 days | Status: CP
Start: 2017-08-29 — End: 2018-08-29

## 2017-09-06 MED FILL — PROGRAF/1MG/CAP: PROGRAF/1MG/CAP | 30 days supply | Qty: 120 | Fill #1

## 2017-09-06 NOTE — Unmapped (Signed)
[facility] Refill and Clinical Coordination Note  Medication(s): Prograf 1mg     David Stall, DOB: 1976/07/31  Phone: [phone] (home) , Alternate phone contact: N/A  Shipping address: [address]  [facility] [address]  Phone or address changes today?: No  All above HIPAA information verified.  Insurance changes? No    Completed refill and clinical call assessment today to schedule patient's medication shipment from the [facility] [phone]).      MEDICATION RECONCILIATION    Confirmed the medication and dosage are correct and have not changed: Yes, regimen is correct and unchanged.    Were there any changes to your medication(s) in the past month:  No, there are no changes reported at this time.    ADHERENCE    Prograf 1 mg   Quantity filled last month: 120 capsules   # of capsules left on hand: 12 capsules        Did you miss any doses in the past 4 weeks? No missed doses reported.  Adherence counseling provided? Not needed     SIDE EFFECT MANAGEMENT    Are you tolerating your medication?:  Arush reports tolerating the medication.  Side effect management discussed: None      Therapy is appropriate and should be continued.    Evidence of clinical benefit: See Epic note from 05/03/17      FINANCIAL/SHIPPING    Delivery Scheduled: Yes, Expected medication delivery date: 09/07/17   Additional medications refilled: none      Muath did not have any additional questions at this time.    Delivery address validated in FSI scheduling system: Yes, address listed above is correct.      We will follow up with patient monthly for standard refill processing and delivery.      Thank you,  Roderic Palau   [facility] Shared [facility] Specialty Pharmacist

## 2017-09-30 NOTE — Unmapped (Signed)
[facility] Refill Coordination Note  Specialty Medication(s): Prograf 1mg     Johnny Evans, DOB: [birth year]  Phone: [phone] (home) , Alternate phone contact: N/A  Phone or address changes today?: Yes  All above HIPAA information was verified with patient.  Shipping Address: [address]  [place] [address]   Insurance changes? Yes    Completed refill call assessment today to schedule patient's medication shipment from the [facility] [phone]).      Confirmed the medication and dosage are correct and have not changed: Yes, regimen is correct and unchanged.    Confirmed patient started or stopped the following medications in the past month:  No, there are no changes reported at this time.    Are you tolerating your medication?:  Johnny Evans reports tolerating the medication.    ADHERENCE    (Below is required for Medicare Part B or Transplant patients only - per drug):   How many tablets were dispensed last month:     Prograf 1 mg   Quantity filled last month: 120   # of tablets left on hand: 5 days    Did you miss any doses in the past 4 weeks? No missed doses reported.    FINANCIAL/SHIPPING    Delivery Scheduled: Yes, Expected medication delivery date: 10/05/2017     Johnny Evans did not have any additional questions at this time.    Delivery address validated in FSI scheduling system: Yes, address listed in FSI is correct.    We will follow up with patient monthly for standard refill processing and delivery.      Thank you,  Tamala Fothergill   [facility] Shared [facility] Specialty Technician

## 2017-10-04 MED FILL — PROGRAF/1MG/CAP: PROGRAF/1MG/CAP | 30 days supply | Qty: 120 | Fill #2

## 2017-10-04 NOTE — Unmapped (Signed)
Patient is changing jobs; will have a one month minimum coverage gap before new medication plan is in effect.  Left message for PAP to return call.  Application mailed to patient.

## 2017-10-07 ENCOUNTER — Encounter (INDEPENDENT_AMBULATORY_CARE_PROVIDER_SITE_OTHER): Payer: Self-pay

## 2017-10-07 ENCOUNTER — Encounter (INDEPENDENT_AMBULATORY_CARE_PROVIDER_SITE_OTHER): Payer: Self-pay | Admitting: Vascular Surgery

## 2017-10-14 ENCOUNTER — Encounter (INDEPENDENT_AMBULATORY_CARE_PROVIDER_SITE_OTHER): Payer: Self-pay

## 2017-10-14 ENCOUNTER — Encounter (INDEPENDENT_AMBULATORY_CARE_PROVIDER_SITE_OTHER): Payer: Self-pay | Admitting: Vascular Surgery

## 2017-10-24 MED ORDER — SERTRALINE 100 MG TABLET
ORAL_TABLET | Freq: Every day | ORAL | 3 refills | 0.00000 days | Status: CP
Start: 2017-10-24 — End: 2018-07-18

## 2017-10-26 NOTE — Unmapped (Signed)
[facility] Refill Coordination Note  Specialty Medication(s): Prograf 1mg     Johnny Evans, DOB: Mar 17, 1976  Phone: [phone] (home) , Alternate phone contact: N/A  Phone or address changes today?: No  All above HIPAA information was verified with patient.  Shipping Address: [address]  [place] [address]   Insurance changes? No    Completed refill call assessment today to schedule patient's medication shipment from the [facility] [phone]).      Confirmed the medication and dosage are correct and have not changed: Yes, regimen is correct and unchanged.    Confirmed patient started or stopped the following medications in the past month:  No, there are no changes reported at this time.    Are you tolerating your medication?:  Johnny Evans reports tolerating the medication.    ADHERENCE    (Below is required for Medicare Part B or Transplant patients only - per drug):   How many tablets were dispensed last month:     Prograf 1 mg   Quantity filled last month: 120   # of tablets left on hand: 10 days    Did you miss any doses in the past 4 weeks? No missed doses reported.    FINANCIAL/SHIPPING    Delivery Scheduled: Yes, Expected medication delivery date: 11/04/2017     Johnny Evans did not have any additional questions at this time.    Delivery address validated in FSI scheduling system: Yes, address listed in FSI is correct.    We will follow up with patient monthly for standard refill processing and delivery.      Thank you,  Tamala Fothergill   [facility] Shared [facility] Specialty Technician

## 2017-11-03 MED FILL — PROGRAF/1MG/CAP: PROGRAF/1MG/CAP | 30 days supply | Qty: 120 | Fill #3

## 2017-11-07 NOTE — Unmapped (Deleted)
{** REMINDER - THIS NOTE IS NOT A FINAL MEDICAL RECORD UNTIL IT IS SIGNED.  UNTIL THEN, THE CONTENT BELOW MAY REFLECT INFORMATION FROM A DOCUMENTATION TEMPLATE, NOT THE ACTUAL PATIENT VISIT. **}    [facility] [facility] [employer] transplant nephrology clinic visit    assessment and plan:  1. s/p kidney transplant 05/23/2014. baseline creatinine 1-1.2 mg/dl. no proteinuria. no donor specific hla ab detected. no change in maintenance immunosuppression. tacrolimus 12hr goal 5-8 ng/ml. consistently elevated >8, will reduce tac to 2mg  bid.  2. bkv nephropathy. resolved. bkv pcr undetectable x12m.  3. hypertension. blood pressure goal < 140/90 mmhg.  4. atrial fibrillation. patient currently reports ~one episode per week. will refer to cardiology, last seen 3 years ago. low AF burden and CHADS2Vasc of 1. On metoprolol and aspirin.  5. depression. currently on sertraline 100mg  daily, will consider increasing dose. will refer to transplant psychology.  6. preventive medicine/vaccination. influenza '17. pcv13 pneumococcal '14. ppsv23 pneumococcal '16.    history of present illness:  mr. Johnny Evans is a 42 y.o. gentleman seen in follow up post kidney transplant 05/23/2014.     since last seen, he reports that he was able to return to work about 6 months ago but had to stop due to the early morning shift schedule exacerbating his a-fib. he currently experiences episodes once a week for about two minutes, +shortness of breath, +chest pain, and +fluttering. his primary complaint is lack of sleep making him constantly fatigued. he has also been having difficulty with his 2 y.o. son, who was recently diagnosed with autism and is currently undergoing developmental studies. denies dysuria, hematuria, or difficulty emptying bladder. denies fevers, chills, sweats, headache, or lightheadedness. also denies nausea, vomiting, diarrhea. all other symptoms reveiwed and negative x10 systems. last took prograf at 10pm.    past medical hx:  1. 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 focal segmental glomerulosclerosis. alemtuzumab induction. baseline creatinine 1-1.2 mg/dl.  > kidney bx 12/15: +stage1 bk nephropathy. no acute/chr rejection.  2. hypertension  3. hyperlipidemia  4. echocardiogram '14: nl lv. lvef > 55%. diastolic lv dysfunction.  5. atrial fibrillation  6. gout  7. hx asthma/reactive airways disease    past surgical hx: left upper ext av fistula '10. kidney transplant '15.    all: ace inh    medications: tacrolimus 2mg  bid, azathioprine 150mg  daily, prednisone 5mg  daily, metoprolol xl 25mg  daily, omega3 fish oil 2gm daily, aspirin 81mg  daily, sertraline 100mg  daily, vitamin d3 1,000u daily.    soc hx: married, currently living with wife x2 sons, younger being evaluated for autism. former smoking hx.    physical exam:  There were no vitals taken for this visit.  gwd/wn gentleman nl/appropriate affect and mood. nl sclera anicteric. mmm nl dentition no thrush. neck supple no palpable ln. heart rrr nl s1s2. lungs clear bilateral. abd soft nt/nd. no lower ext edema. left upper ext av fistula +bruit/thrill. 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 msk no synovitis/tophi. skin no rash. neuro alert oriented non focal exam.    labs:  Results for orders placed or performed during the hospital encounter of 08/16/17   CMV DNA, quantitative, PCR   Result Value Ref Range    CMV Viral Ld Not Detected Not Detected    CMV Quant  <0 IU/mL    CMV Quant Log10  <0.00 log IU/mL    CMV Comment     Hemoglobin A1c   Result Value Ref Range    Hemoglobin A1C  5.6 4.8 - 5.6 %    Estimated Average Glucose 114 mg/dL   Magnesium Level   Result Value Ref Range    Magnesium 1.4 (L) 1.6 - 2.2 mg/dL   Phosphorus Level   Result Value Ref Range    Phosphorus 3.8 2.9 - 4.7 mg/dL   Tacrolimus Level, Trough   Result Value Ref Range    Tacrolimus, Trough 4.9 <=20.0 ng/mL   Vitamin D 25 Hydroxy (25OH D2 + D3)   Result Value Ref Range    Vitamin D Total (25OH) 58.6 20.0 - 80.0 ng/mL   Comprehensive Metabolic Panel   Result Value Ref Range    Sodium 144 135 - 145 mmol/L    Potassium 4.2 3.5 - 5.0 mmol/L    Chloride 104 98 - 107 mmol/L    CO2 30.0 22.0 - 30.0 mmol/L    BUN 16 7 - 21 mg/dL    Creatinine 5.78 4.69 - 1.30 mg/dL    BUN/Creatinine Ratio 17     EGFR MDRD Non Af Amer >=60 >=60 mL/min/1.36m2    EGFR MDRD Af Amer >=60 >=60 mL/min/1.94m2    Anion Gap 10 9 - 15 mmol/L    Glucose 91 65 - 179 mg/dL    Calcium 9.8 8.5 - 62.9 mg/dL    Albumin 4.2 3.5 - 5.0 g/dL    Total Protein 7.1 6.5 - 8.3 g/dL    Total Bilirubin 1.1 0.0 - 1.2 mg/dL    AST 21 19 - 55 U/L    ALT 33 19 - 72 U/L    Alkaline Phosphatase 73 38 - 126 U/L   CBC w/ Differential   Result Value Ref Range    WBC 8.6 4.5 - 11.0 10*9/L    RBC 4.99 4.50 - 5.90 10*12/L    HGB 15.2 13.5 - 17.5 g/dL    HCT 52.8 41.3 - 24.4 %    MCV 94.6 80.0 - 100.0 fL    MCH 30.4 26.0 - 34.0 pg    MCHC 32.2 31.0 - 37.0 g/dL    RDW 01.0 (H) 27.2 - 15.0 %    MPV 8.2 7.0 - 10.0 fL    Platelet 230 150 - 440 10*9/L    Absolute Neutrophils 6.9 2.0 - 7.5 10*9/L    Absolute Lymphocytes 0.9 (L) 1.5 - 5.0 10*9/L    Absolute Monocytes 0.6 0.2 - 0.8 10*9/L    Absolute Eosinophils 0.1 0.0 - 0.4 10*9/L    Absolute Basophils 0.0 0.0 - 0.1 10*9/L    Large Unstained Cells 2 0 - 4 %    Macrocytosis Slight (A) Not Present     Scribe's Attestation: Elwyn Lade, MD obtained and performed the history, physical exam and medical decision making elements that were entered into the chart.  Signed by [place] Ormond Foster, Scribe, on November 07, 2017 at 10:10 AM.    {*** NOTE TO PROVIDER: PLEASE ADD ATTESTATION NOTING YOU AGREE WITH SCRIBE DOCUMENTATION}

## 2017-11-15 ENCOUNTER — Other Ambulatory Visit
Admission: RE | Admit: 2017-11-15 | Discharge: 2017-11-15 | Disposition: A | Discharge status: Home / Self Care | Payer: Medicare Other | Source: Ambulatory Visit | Attending: Nephrology | Admitting: Nephrology

## 2017-11-15 DIAGNOSIS — N390 Urinary tract infection, site not specified: Secondary | ICD-10-CM | POA: Insufficient documentation

## 2017-11-15 DIAGNOSIS — E1129 Type 2 diabetes mellitus with other diabetic kidney complication: Secondary | ICD-10-CM | POA: Diagnosis not present

## 2017-11-15 DIAGNOSIS — Z789 Other specified health status: Secondary | ICD-10-CM | POA: Insufficient documentation

## 2017-11-15 DIAGNOSIS — Z09 Encounter for follow-up examination after completed treatment for conditions other than malignant neoplasm: Secondary | ICD-10-CM | POA: Diagnosis present

## 2017-11-15 DIAGNOSIS — Z940 Kidney transplant status: Secondary | ICD-10-CM | POA: Diagnosis present

## 2017-11-15 DIAGNOSIS — Z79899 Other long term (current) drug therapy: Secondary | ICD-10-CM | POA: Insufficient documentation

## 2017-11-15 DIAGNOSIS — E559 Vitamin D deficiency, unspecified: Secondary | ICD-10-CM | POA: Insufficient documentation

## 2017-11-15 DIAGNOSIS — Z114 Encounter for screening for human immunodeficiency virus [HIV]: Secondary | ICD-10-CM | POA: Diagnosis not present

## 2017-11-15 DIAGNOSIS — D631 Anemia in chronic kidney disease: Secondary | ICD-10-CM | POA: Diagnosis not present

## 2017-11-15 LAB — CBC WITH DIFFERENTIAL/PLATELET
Basophils Absolute: 0 10*3/uL (ref 0–0.1)
Basophils Relative: 0 %
EOS PCT: 2 %
Eosinophils Absolute: 0.2 10*3/uL (ref 0–0.7)
HEMATOCRIT: 49.7 % (ref 40.0–52.0)
Hemoglobin: 16.3 g/dL (ref 13.0–18.0)
LYMPHS ABS: 0.8 10*3/uL — AB (ref 1.0–3.6)
Lymphocytes Relative: 9 %
MCH: 30.3 pg (ref 26.0–34.0)
MCHC: 32.8 g/dL (ref 32.0–36.0)
MCV: 92.5 fL (ref 80.0–100.0)
MONO ABS: 0.8 10*3/uL (ref 0.2–1.0)
MONOS PCT: 10 %
Neutro Abs: 7.1 10*3/uL — ABNORMAL HIGH (ref 1.4–6.5)
Neutrophils Relative %: 79 %
PLATELETS: 222 10*3/uL (ref 150–440)
RBC: 5.38 MIL/uL (ref 4.40–5.90)
RDW: 15 % — AB (ref 11.5–14.5)
WBC: 8.9 10*3/uL (ref 3.8–10.6)

## 2017-11-15 LAB — BASIC METABOLIC PANEL
Anion gap: 9 (ref 5–15)
BUN: 13 mg/dL (ref 6–20)
CALCIUM: 9.6 mg/dL (ref 8.9–10.3)
CO2: 26 mmol/L (ref 22–32)
CREATININE: 0.89 mg/dL (ref 0.61–1.24)
Chloride: 106 mmol/L (ref 101–111)
GFR calc non Af Amer: >60 mL/min (ref >60)
GLUCOSE: 105 mg/dL — AB (ref 65–99)
Potassium: 4.5 mmol/L (ref 3.5–5.1)
Sodium: 141 mmol/L (ref 135–145)

## 2017-11-15 LAB — MAGNESIUM: Magnesium: 1.7 mg/dL (ref 1.7–2.4)

## 2017-11-15 LAB — PHOSPHORUS: Phosphorus: 3 mg/dL (ref 2.5–4.6)

## 2017-11-16 LAB — TACROLIMUS LEVEL: Tacrolimus (FK506) - LabCorp: 11 ng/mL (ref 2.0–20.0)

## 2017-11-24 NOTE — Unmapped (Signed)
[name] Refill Coordination Note  Specialty Medication(s):PROGRAF 1 MG CAPSULES      Johnny Evans, DOB: 05-22-1976  Phone: [phone] (home) , Alternate phone contact: N/A  Phone or address changes today?: No  All above HIPAA information was verified with patient.  Shipping Address: [address]  [place] [address]   Insurance changes? No    Completed refill call assessment today to schedule patient's medication shipment from the [facility] [phone]).      Confirmed the medication and dosage are correct and have not changed: Yes, regimen is correct and unchanged.    Confirmed patient started or stopped the following medications in the past month:  No, there are no changes reported at this time.    Are you tolerating your medication?:  [name] reports tolerating the medication.  Prograf 1 mg   Quantity filled last month: 120   # of tablets left on hand: 40 CAPSULES=10 DAYS          Did you miss any doses in the past 4 weeks? No missed doses reported.    FINANCIAL/SHIPPING    Delivery Scheduled: Yes, Expected medication delivery date: 031319     The patient will receive an FSI print out for each medication shipped and additional FDA Medication Guides as required.  Patient education from [place] or Robet Leu may also be included in the shipment    [name] did not have any additional questions at this time.    Delivery address validated in FSI scheduling system: Yes, address listed in FSI is correct.    We will follow up with patient monthly for standard refill processing and delivery.      Thank you,  Johnny Evans   [facility] Specialty Technician

## 2017-11-29 MED FILL — PROGRAF/1MG/CAP: PROGRAF/1MG/CAP | 30 days supply | Qty: 120 | Fill #4

## 2017-12-21 NOTE — Unmapped (Signed)
[facility] Refill Coordination Note  Specialty Medication(s): Prograf 1mg     David Stall, DOB: [birth year]  Phone: [phone] (home) , Alternate phone contact: N/A  Phone or address changes today?: No  All above HIPAA information was verified with patient.  Shipping Address: [address]  [place] [address]   Insurance changes? No    Completed refill call assessment today to schedule patient's medication shipment from the [facility] [phone]).      Confirmed the medication and dosage are correct and have not changed: Yes, regimen is correct and unchanged.    Confirmed patient started or stopped the following medications in the past month:  No, there are no changes reported at this time.    Are you tolerating your medication?:  Jobany reports tolerating the medication.    ADHERENCE    (Below is required for Medicare Part B or Transplant patients only - per drug):   How many tablets were dispensed last month:     Prograf 1 mg   Quantity filled last month: 120   # of tablets left on hand: 10 DAYS    Did you miss any doses in the past 4 weeks? No missed doses reported.    FINANCIAL/SHIPPING    Delivery Scheduled: Yes, Expected medication delivery date: 12/30/2017     The patient will receive an FSI print out for each medication shipped and additional FDA Medication Guides as required.  Patient education from [place] or Robet Leu may also be included in the shipment    Rhoderick did not have any additional questions at this time.    Delivery address validated in FSI scheduling system: Yes, address listed in FSI is correct.    We will follow up with patient monthly for standard refill processing and delivery.      Thank you,  Tamala Fothergill   [facility] Shared [facility] Specialty Technician

## 2017-12-29 MED FILL — PROGRAF/1MG/CAP: PROGRAF/1MG/CAP | 30 days supply | Qty: 120 | Fill #5

## 2018-01-16 NOTE — Unmapped (Signed)
[facility] Refill and Clinical Coordination Note  Medication(s): PROGRAF 1MG     Johnny Evans, DOB: 1975/10/08  Phone: [phone] (home) , Alternate phone contact: N/A  Shipping address:[address] [place], [place] [address]  Phone or address changes today?: Yes- address listed here is correct, also adjusted in FSI  All above HIPAA information verified.  Insurance changes? No    Completed refill and clinical call assessment today to schedule patient's medication shipment from the [facility] [phone]).      MEDICATION RECONCILIATION    Confirmed the medication and dosage are correct and have not changed: Yes, regimen is correct and unchanged.    Were there any changes to your medication(s) in the past month:  No, there are no changes reported at this time.    ADHERENCE    Is this medicine transplant or covered by Medicare Part B? Yes.    Prograf 1 mg   Quantity filled last month: 120   # of tablets left on hand: 12 days left    Did you miss any doses in the past 4 weeks? No missed doses reported.  Adherence counseling provided? Not needed     SIDE EFFECT MANAGEMENT    Are you tolerating your medication?:  Johnny Evans reports tolerating the medication.  Side effect management discussed: None      Therapy is appropriate and should be continued.    Evidence of clinical benefit: See Epic note from 05/03/17      FINANCIAL/SHIPPING    Delivery Scheduled: Yes, Expected medication delivery date: 01/23/18   Additional medications refilled: No additional medications/refills needed at this time.    The patient will receive an FSI print out for each medication shipped and additional FDA Medication Guides as required.  Patient education from [place] or Robet Leu may also be included in the shipment.    Johnny Evans did not have any additional questions at this time.    Delivery address validated in FSI scheduling system: Yes, address listed above is correct.      We will follow up with patient monthly for standard refill processing and delivery.      Thank you,  Thad Ranger   [facility] Shared [facility] Specialty Pharmacist

## 2018-01-20 MED FILL — PROGRAF/1MG/CAP: PROGRAF/1MG/CAP | 30 days supply | Qty: 120 | Fill #6

## 2018-02-27 NOTE — Unmapped (Signed)
[facility] Refill Coordination Note    Specialty Medication(s) to be Shipped:   Transplant: Prograf 1mg      David Stall, DOB: 1976-05-15  Phone: [phone] (home)   Shipping Address: [address]  [phone] [place] [address]    All above HIPAA information was verified with patient.     Completed refill call assessment today to schedule patient's medication shipment from the [facility] [phone]).       Specialty medication(s) and dose(s) confirmed: Regimen is correct and unchanged.   Changes to medications: Matheu reports no changes reported at this time.  Changes to insurance: No  Questions for the pharmacist: No    The patient will receive an FSI print out for each medication shipped and additional FDA Medication Guides as required.  Patient education from [place] or Robet Leu may also be included in the shipment.    DISEASE-SPECIFIC INFORMATION        N/A    ADHERENCE     Medication Adherence    Patient reported X missed doses in the last month:  0          MEDICARE PART B DOCUMENTATION     Prograf 1mg : Patient has 8 days worth of capsules on hand.    SHIPPING     Shipping address confirmed in FSI.     Delivery Scheduled: Yes, Expected medication delivery date: 03/06/2018 via UPS or courier.     Myles Mallicoat Leodis Binet   [facility] Shared [facility] Specialty Technician

## 2018-03-03 MED FILL — PROGRAF/1MG/CAP: PROGRAF/1MG/CAP | 30 days supply | Qty: 120 | Fill #7

## 2018-03-07 NOTE — Unmapped (Deleted)
{** REMINDER - THIS NOTE IS NOT A FINAL MEDICAL RECORD UNTIL IT IS SIGNED.?? UNTIL THEN, THE CONTENT BELOW MAY REFLECT INFORMATION FROM A DOCUMENTATION TEMPLATE, NOT THE ACTUAL PATIENT VISIT. **}  [employer] [place] transplant nephrology clinic visit    assessment and plan:  1. s/p kidney transplant 05/23/2014. baseline creatinine 1-1.2 mg/dl. no proteinuria. no donor specific hla ab detected. no change in maintenance immunosuppression. tacrolimus 12hr goal 5-8 ng/ml. consistently elevated >8, will reduce tac to 2mg  bid.  2. bkv nephropathy. resolved. bkv pcr undetectable x59m.  3. hypertension. blood pressure goal < 140/90 mmhg.  4. atrial fibrillation. patient currently reports ~one episode per week. will refer to cardiology, last seen 3 years ago. low AF burden and CHADS2Vasc of 1. On metoprolol and aspirin.  5. depression. currently on sertraline 100mg  daily, will consider increasing dose. will refer to transplant psychology.  6. preventive medicine/vaccination. influenza '17. pcv13 pneumococcal '14. ppsv23 pneumococcal '16.    history of present illness:  mr. Johnny Evans is a 42 y.o. gentleman seen in follow up post kidney transplant 05/23/2014.     seen by cards 05/16/17 for pvc and palpitations. metoprolol xl inc to 50 mg. pt called 08/15/17 with +swelling and pain to dialysis site. started on doxy 100 mg bid x 10 days. no evidence of dvt on pvl.      since last seen, he reports that he was able to return to work about 6 months ago but had to stop due to the early morning shift schedule exacerbating his a-fib. he currently experiences episodes once a week for about two minutes, +shortness of breath, +chest pain, and +fluttering. his primary complaint is lack of sleep making him constantly fatigued. he has also been having difficulty with his 2 y.o. son, who was recently diagnosed with autism and is currently undergoing developmental studies. denies dysuria, hematuria, or difficulty emptying bladder. denies fevers, chills, sweats, headache, or lightheadedness. also denies nausea, vomiting, diarrhea. all other symptoms reveiwed and negative x10 systems. last took prograf at 10pm.    past medical hx:  1. 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 focal segmental glomerulosclerosis. alemtuzumab induction. baseline creatinine 1-1.2 mg/dl.  > kidney bx 12/15: +stage1 bk nephropathy. no acute/chr rejection.  2. hypertension  3. hyperlipidemia  4. echocardiogram '14: nl lv. lvef > 55%. diastolic lv dysfunction.  5. atrial fibrillation  6. gout  7. hx asthma/reactive airways disease    past surgical hx: left upper ext av fistula '10. kidney transplant '15.    all: ace inh    medications: tacrolimus 2mg  bid, azathioprine 150mg  daily, prednisone 5mg  daily, metoprolol xl 25mg  daily, omega3 fish oil 2gm daily, aspirin 81mg  daily, sertraline 100mg  daily, vitamin d3 1,000u daily.    soc hx: married, currently living with wife x2 sons, younger being evaluated for autism. former smoking hx.    physical exam:  There were no vitals taken for this visit.  gwd/wn gentleman nl/appropriate affect and mood. nl sclera anicteric. mmm nl dentition no thrush. neck supple no palpable ln. heart rrr nl s1s2. lungs clear bilateral. abd soft nt/nd. no lower ext edema. left upper ext av fistula +bruit/thrill. 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 msk no synovitis/tophi. skin no rash. neuro alert oriented non focal exam.    labs:  Results for orders placed or performed during the hospital encounter of 08/16/17   CMV DNA, quantitative, PCR   Result Value Ref Range    CMV Viral Ld Not Detected Not  Detected    CMV Quant  <0 IU/mL    CMV Quant Log10  <0.00 log IU/mL    CMV Comment     Hemoglobin A1c   Result Value Ref Range    Hemoglobin A1C 5.6 4.8 - 5.6 %    Estimated Average Glucose 114 mg/dL   Magnesium Level   Result Value Ref Range    Magnesium 1.4 (L) 1.6 - 2.2 mg/dL   Phosphorus Level   Result Value Ref Range Phosphorus 3.8 2.9 - 4.7 mg/dL   Tacrolimus Level, Trough   Result Value Ref Range    Tacrolimus, Trough 4.9 <=20.0 ng/mL   Vitamin D 25 Hydroxy (25OH D2 + D3)   Result Value Ref Range    Vitamin D Total (25OH) 58.6 20.0 - 80.0 ng/mL   Comprehensive Metabolic Panel   Result Value Ref Range    Sodium 144 135 - 145 mmol/L    Potassium 4.2 3.5 - 5.0 mmol/L    Chloride 104 98 - 107 mmol/L    CO2 30.0 22.0 - 30.0 mmol/L    BUN 16 7 - 21 mg/dL    Creatinine 1.61 0.96 - 1.30 mg/dL    BUN/Creatinine Ratio 17     EGFR MDRD Non Af Amer >=60 >=60 mL/min/1.72m2    EGFR MDRD Af Amer >=60 >=60 mL/min/1.4m2    Anion Gap 10 9 - 15 mmol/L    Glucose 91 65 - 179 mg/dL    Calcium 9.8 8.5 - 04.5 mg/dL    Albumin 4.2 3.5 - 5.0 g/dL    Total Protein 7.1 6.5 - 8.3 g/dL    Total Bilirubin 1.1 0.0 - 1.2 mg/dL    AST 21 19 - 55 U/L    ALT 33 19 - 72 U/L    Alkaline Phosphatase 73 38 - 126 U/L   CBC w/ Differential   Result Value Ref Range    WBC 8.6 4.5 - 11.0 10*9/L    RBC 4.99 4.50 - 5.90 10*12/L    HGB 15.2 13.5 - 17.5 g/dL    HCT 40.9 81.1 - 91.4 %    MCV 94.6 80.0 - 100.0 fL    MCH 30.4 26.0 - 34.0 pg    MCHC 32.2 31.0 - 37.0 g/dL    RDW 78.2 (H) 95.6 - 15.0 %    MPV 8.2 7.0 - 10.0 fL    Platelet 230 150 - 440 10*9/L    Absolute Neutrophils 6.9 2.0 - 7.5 10*9/L    Absolute Lymphocytes 0.9 (L) 1.5 - 5.0 10*9/L    Absolute Monocytes 0.6 0.2 - 0.8 10*9/L    Absolute Eosinophils 0.1 0.0 - 0.4 10*9/L    Absolute Basophils 0.0 0.0 - 0.1 10*9/L    Large Unstained Cells 2 0 - 4 %    Macrocytosis Slight (A) Not Present

## 2018-03-21 ENCOUNTER — Ambulatory Visit: Admit: 2018-03-21 | Discharge: 2018-03-22 | Payer: MEDICARE

## 2018-03-21 DIAGNOSIS — Z7289 Other problems related to lifestyle: Secondary | ICD-10-CM

## 2018-03-21 DIAGNOSIS — Z940 Kidney transplant status: Principal | ICD-10-CM

## 2018-03-21 DIAGNOSIS — Z1159 Encounter for screening for other viral diseases: Secondary | ICD-10-CM

## 2018-03-21 LAB — COMPREHENSIVE METABOLIC PANEL
ALBUMIN: 4.3 g/dL (ref 3.5–5.0)
ALKALINE PHOSPHATASE: 82 U/L (ref 38–126)
ALT (SGPT): 22 U/L (ref 19–72)
ANION GAP: 9 mmol/L (ref 9–15)
BILIRUBIN TOTAL: 0.5 mg/dL (ref 0.0–1.2)
BLOOD UREA NITROGEN: 14 mg/dL (ref 7–21)
BUN / CREAT RATIO: 14
CALCIUM: 10 mg/dL (ref 8.5–10.2)
CHLORIDE: 106 mmol/L (ref 98–107)
CO2: 24 mmol/L (ref 22.0–30.0)
CREATININE: 1 mg/dL (ref 0.70–1.30)
EGFR CKD-EPI AA MALE: >90 mL/min/{1.73_m2} (ref >=60)
EGFR CKD-EPI NON-AA MALE: >90 mL/min/{1.73_m2} (ref >=60)
GLUCOSE RANDOM: 99 mg/dL (ref 65–99)
POTASSIUM: 4.4 mmol/L (ref 3.5–5.0)
PROTEIN TOTAL: 7.2 g/dL (ref 6.5–8.3)
SODIUM: 139 mmol/L (ref 135–145)

## 2018-03-21 LAB — CBC W/ AUTO DIFF
BASOPHILS ABSOLUTE COUNT: 0.1 10*9/L (ref 0.0–0.1)
BASOPHILS RELATIVE PERCENT: 0.7 %
EOSINOPHILS RELATIVE PERCENT: 5.1 %
HEMATOCRIT: 48.8 % (ref 41.0–53.0)
HEMOGLOBIN: 15.8 g/dL (ref 13.5–17.5)
LARGE UNSTAINED CELLS: 2 % (ref 0–4)
MEAN CORPUSCULAR HEMOGLOBIN CONC: 32.3 g/dL (ref 31.0–37.0)
MEAN CORPUSCULAR HEMOGLOBIN: 30 pg (ref 26.0–34.0)
MEAN CORPUSCULAR VOLUME: 92.8 fL (ref 80.0–100.0)
MEAN PLATELET VOLUME: 7.8 fL (ref 7.0–10.0)
MONOCYTES ABSOLUTE COUNT: 0.5 10*9/L (ref 0.2–0.8)
MONOCYTES RELATIVE PERCENT: 6.1 %
NEUTROPHILS ABSOLUTE COUNT: 5 10*9/L (ref 2.0–7.5)
NEUTROPHILS RELATIVE PERCENT: 65.5 %
PLATELET COUNT: 257 10*9/L (ref 150–440)
RED BLOOD CELL COUNT: 5.26 10*12/L (ref 4.50–5.90)
RED CELL DISTRIBUTION WIDTH: 15 % (ref 12.0–15.0)
WBC ADJUSTED: 7.7 10*9/L (ref 4.5–11.0)

## 2018-03-21 LAB — TACROLIMUS, TROUGH: Lab: 6.4

## 2018-03-21 LAB — MAGNESIUM: Magnesium:MCnc:Pt:Ser/Plas:Qn:: 1.5 — ABNORMAL LOW

## 2018-03-21 LAB — HEMOGLOBIN A1C: ESTIMATED AVERAGE GLUCOSE: 114 mg/dL

## 2018-03-21 LAB — PHOSPHORUS: Phosphate:MCnc:Pt:Ser/Plas:Qn:: 3.5

## 2018-03-21 LAB — ESTIMATED AVERAGE GLUCOSE: Estimated average glucose:MCnc:Pt:Bld:Qn:Estimated from glycated hemoglobin: 114

## 2018-03-21 LAB — HEPATITIS C ANTIBODY: Hepatitis C virus Ab:PrThr:Pt:Ser:Ord:: NONREACTIVE

## 2018-03-21 LAB — POTASSIUM: Potassium:SCnc:Pt:Ser/Plas:Qn:: 4.4

## 2018-03-21 LAB — BILIRUBIN DIRECT: Bilirubin.glucuronidated:MCnc:Pt:Ser/Plas:Qn:: 0.2

## 2018-03-21 LAB — RED BLOOD CELL COUNT: Lab: 5.26

## 2018-03-21 LAB — HEPATITIS B SURFACE ANTIGEN: Hepatitis B virus surface Ag:PrThr:Pt:Ser:Ord:: NONREACTIVE

## 2018-03-22 LAB — CMV DNA, QUANTITATIVE, PCR: CMV VIRAL LD: NOT DETECTED

## 2018-03-22 LAB — CMV VIRAL LD: Lab: NOT DETECTED

## 2018-03-24 LAB — EBV VIRAL LOAD RESULT: Lab: NOT DETECTED

## 2018-03-24 LAB — VITAMIN D, TOTAL (25OH): Lab: 32.5

## 2018-03-28 LAB — HLA DS POST TRANSPLANT
ANTI-DONOR DRW #1 MFI: 718 MFI
ANTI-DONOR DRW #2 MFI: 173 MFI
ANTI-DONOR HLA-A #1 MFI: 21 MFI
ANTI-DONOR HLA-B #1 MFI: 26 MFI
ANTI-DONOR HLA-B #2 MFI: 15 MFI
ANTI-DONOR HLA-C #1 MFI: 12 MFI
ANTI-DONOR HLA-C #2 MFI: 18 MFI
ANTI-DONOR HLA-DQB #2 MFI: 412 MFI
ANTI-DONOR HLA-DR #1 MFI: 697 MFI
ANTI-DONOR HLA-DR #2 MFI: 359 MFI

## 2018-03-28 LAB — DONOR DRW ANTIGEN #2

## 2018-03-28 LAB — HLA CL2 AB COMMENT: Lab: 0

## 2018-03-28 LAB — FSAB CLASS 2 ANTIBODY SPECIFICITY: HLA CL2 AB RESULT: POSITIVE

## 2018-03-28 LAB — HLA CLASS 1 ANTIBODY

## 2018-03-28 LAB — FSAB CLASS 1 ANTIBODY SPECIFICITY: HLA CLASS 1 ANTIBODY RESULT: POSITIVE

## 2018-03-31 NOTE — Unmapped (Signed)
[facility] Refill Coordination Note    Specialty Medication(s) to be Shipped:   Transplant: Prograf 1mg      Johnny Evans, DOB: 1976/04/26  Phone: [phone] (home)   Shipping Address: [address]  [place] [address]    All above HIPAA information was verified with patient.     Completed refill call assessment today to schedule patient's medication shipment from the [facility] [phone]).       Specialty medication(s) and dose(s) confirmed: Regimen is correct and unchanged.   Changes to medications: Johnny Evans reports no changes reported at this time.  Changes to insurance: No  Questions for the pharmacist: No    The patient will receive an FSI print out for each medication shipped and additional FDA Medication Guides as required.  Patient education from Johnny Evans or Johnny Evans may also be included in the shipment.    DISEASE-SPECIFIC INFORMATION        N/A    ADHERENCE     Medication Adherence    Patient reported X missed doses in the last month:  0         MEDICARE PART B DOCUMENTATION     Prograf 1mg : Patient has 10 days worth of capsules on hand.    SHIPPING     Shipping address confirmed in FSI.     Delivery Scheduled: Yes, Expected medication delivery date: 04/07/2018 via UPS or courier.     Johnny Evans   [facility] Special[facility] [place]

## 2018-04-06 MED FILL — PROGRAF/1MG/CAP: PROGRAF/1MG/CAP | 30 days supply | Qty: 120 | Fill #8

## 2018-04-17 NOTE — Unmapped (Signed)
Standing lab orders updated

## 2018-04-18 NOTE — Unmapped (Signed)
Johnny Evans contacted previous [facility] w/ c/o painful fistula, discuss with Dr. Toni Arthurs. Called and spoke w pt and suggested he take acetaminophen and use warm compresses on painful area.  Pt agrees to a consult in the [facility] Vascular Access clinic with Dr. Norma Fredrickson. In basket msg sent to [employer].

## 2018-04-20 ENCOUNTER — Emergency Department
Admit: 2018-04-20 | Discharge: 2018-04-20 | Disposition: A | Discharge status: Home / Self Care | Payer: MEDICARE | Attending: Emergency Medicine

## 2018-04-20 ENCOUNTER — Ambulatory Visit
Admit: 2018-04-20 | Discharge: 2018-04-20 | Disposition: A | Discharge status: Home / Self Care | Payer: MEDICARE | Attending: Emergency Medicine

## 2018-04-20 DIAGNOSIS — T82858A Stenosis of vascular prosthetic devices, implants and grafts, initial encounter: Principal | ICD-10-CM

## 2018-04-20 LAB — COMPREHENSIVE METABOLIC PANEL
ALBUMIN: 3.9 g/dL (ref 3.5–5.0)
ALKALINE PHOSPHATASE: 64 U/L (ref 38–126)
ALT (SGPT): 14 U/L — ABNORMAL LOW (ref 19–72)
ANION GAP: 6 mmol/L — ABNORMAL LOW (ref 9–15)
BILIRUBIN TOTAL: 1.5 mg/dL — ABNORMAL HIGH (ref 0.0–1.2)
BLOOD UREA NITROGEN: 13 mg/dL (ref 7–21)
BUN / CREAT RATIO: 15
CALCIUM: 9.7 mg/dL (ref 8.5–10.2)
CHLORIDE: 102 mmol/L (ref 98–107)
CO2: 25 mmol/L (ref 22.0–30.0)
EGFR CKD-EPI AA MALE: >90 mL/min/{1.73_m2} (ref >=60)
GLUCOSE RANDOM: 95 mg/dL (ref 65–179)
PROTEIN TOTAL: 6.9 g/dL (ref 6.5–8.3)
SODIUM: 133 mmol/L — ABNORMAL LOW (ref 135–145)

## 2018-04-20 LAB — EOSINOPHILS RELATIVE PERCENT: Lab: 2

## 2018-04-20 LAB — CBC W/ AUTO DIFF
BASOPHILS ABSOLUTE COUNT: 0.1 10*9/L (ref 0.0–0.1)
BASOPHILS RELATIVE PERCENT: 0.5 %
EOSINOPHILS RELATIVE PERCENT: 2 %
HEMATOCRIT: 47.8 % (ref 41.0–53.0)
HEMOGLOBIN: 15.4 g/dL (ref 13.5–17.5)
LARGE UNSTAINED CELLS: 2 % (ref 0–4)
LYMPHOCYTES ABSOLUTE COUNT: 1.1 10*9/L — ABNORMAL LOW (ref 1.5–5.0)
LYMPHOCYTES RELATIVE PERCENT: 11.9 %
MEAN CORPUSCULAR HEMOGLOBIN CONC: 32.3 g/dL (ref 31.0–37.0)
MEAN CORPUSCULAR HEMOGLOBIN: 30.5 pg (ref 26.0–34.0)
MEAN CORPUSCULAR VOLUME: 94.4 fL (ref 80.0–100.0)
MEAN PLATELET VOLUME: 7.8 fL (ref 7.0–10.0)
MONOCYTES ABSOLUTE COUNT: 0.8 10*9/L (ref 0.2–0.8)
MONOCYTES RELATIVE PERCENT: 8.8 %
NEUTROPHILS RELATIVE PERCENT: 74.5 %
PLATELET COUNT: 260 10*9/L (ref 150–440)
RED BLOOD CELL COUNT: 5.07 10*12/L (ref 4.50–5.90)
RED CELL DISTRIBUTION WIDTH: 14.3 % (ref 12.0–15.0)
WBC ADJUSTED: 8.9 10*9/L (ref 4.5–11.0)

## 2018-04-20 LAB — EGFR CKD-EPI AA MALE: Lab: >90

## 2018-04-20 LAB — PROTIME: Lab: 12.7

## 2018-04-20 NOTE — Unmapped (Signed)
[facility] Surgery  Initial Consult Note      Attending Physician:  Marney Setting, MD  Inpatient Service:  Emergency Medicine  Date: 04/20/2018      Assessment :  Johnny Evans is a 42 y.o. 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      Recommendations/Plan:  - Compression with ACE to aid in swelling and pain  - No surgical intervention warranted at this time    History of Present Illness:     Reason for Consult:  Occluded LUE AVF    Johnny Evans is a 42 y.o. 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 Patient has not used fistula since he received his kidney transplant. Patient is otherwise in good health with creatinine of 0.87. Patient states that starting one week ago he began to have progressively worsening pain at his AVF site in his LUE. He also noted swelling. Patient was still able to note thrill in his fistula until yesterday. The pain in his AVF is what brought the patient in. Patient denies nausea/vomiting, patient thinks he may have had chills last night. Denies SOB or chest pain.      Medications    No current facility-administered medications on file prior to encounter.      Current Outpatient Medications on File Prior to Encounter   Medication Sig Dispense Refill   ??? albuterol 2.5 mg /3 mL (0.083 %) nebulizer solution Inhale 2.5 mg.     ??? aspirin (ECOTRIN) 81 MG tablet Take 81 mg by mouth daily.     ??? azaTHIOprine (IMURAN) 50 mg tablet Take 3 tablets (150 mg total) by mouth daily. 270 tablet 3   ??? cholecalciferol, vitamin D3, (VITAMIN D3) 1,000 unit capsule Take 1,000 Units by mouth daily.     ??? magnesium oxide (MAG-OX) 400 mg tablet Take 1 tablet (400 mg total) by mouth daily. 30 tablet 0   ??? metoprolol succinate (TOPROL-XL) 50 MG 24 hr tablet Take 1 tablet (50 mg total) by mouth daily. 30 tablet 11   ??? OMEGA-3/DHA/EPA/FISH OIL (FISH OIL-OMEGA-3 FATTY ACIDS) 300-1,000 mg capsule Take 2 capsules (2 g total) by mouth daily. 90 capsule 0   ??? sertraline (ZOLOFT) 100 MG tablet Take 1 tablet (100 mg total) by mouth daily. 90 tablet 3   ??? tacrolimus (PROGRAF) 1 MG capsule Take 2mg  in the am and 2mg  in the pm, Z94.0, DAW1 150 capsule 11         Past Medical History  Past Medical History:   Diagnosis Date   ??? 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 Dialysis patient (CMS-HCC)    ??? ESRD (end stage renal disease) (CMS-HCC)    ??? FSGS (focal segmental glomerulosclerosis)    ??? Gout    ??? High cholesterol    ??? HTN (hypertension)    ??? Immunosuppression (CMS-HCC) 04/23/2015   ??? Kidney disease     ESRD 2/2 FSGS and HTN on HD MWF; on transplant list   ??? Kidney transplant 05/23/2014 06/23/2014         Past Surgical History  Past Surgical History:   Procedure Laterality Date   ??? CYST REMOVAL      Feb 2014   ???  NEPHRECTOMY TRANSPLANTED ORGAN     ??? PERITONEAL CATHETER INSERTION  07/19/2013   ??? PERITONEAL CATHETER REMOVAL  December 2014    Did not tolerate distension and symptoms of PD at home   ??? PR TRANSPLANTATION OF KIDNEY N/A 05/23/2014    Procedure: RENAL ALLOTRANSPLANTATION, IMPLANTATION OF GRAFT; WITHOUT RECIPIENT NEPHRECTOMY;  Surgeon: Purcell Mouton, MD;  Location: MAIN OR [facility];  Service: Transplant   ??? RENAL BIOPSY      2005         Review of Systems  A 12 system review of systems was negative except as noted in HPI        Vital Signs    Patient Vitals for the past 8 hrs:   BP Temp Temp src Pulse Resp SpO2   04/20/18 1526 112/72 ??? ??? 70 15 100 %   04/20/18 1130 115/73 36.6 ??C Temporal 76 16 99 %       No intake/output data recorded.      Physical Exam  Gen: NAD  Neuro: A&Ox4  Pulmonary: Normal respiratory effort.   Cardiovascular: Regular rate and rhythm.   Abdo: soft, nontender, nondistended, no rebound, no guarding  GU: no Foley in place  Extr: Warm/well perfused, LUE with AVF slightly swollen, no overlying erythema, no appreciable thrill, distal fistula with appreciable pulse    Labs and Studies    Labs:  Recent Results (from the past 24 hour(s))   Comprehensive Metabolic Panel    Collection Time: 04/20/18 12:25 PM   Result Value Ref Range    Sodium 133 (L) 135 - 145 mmol/L    Potassium  3.5 - 5.0 mmol/L    Chloride 102 98 - 107 mmol/L    CO2 25.0 22.0 - 30.0 mmol/L    Anion Gap 6 (L) 9 - 15 mmol/L    BUN 13 7 - 21 mg/dL    Creatinine 1.61 0.96 - 1.30 mg/dL    BUN/Creatinine Ratio 15     EGFR CKD-EPI Non-African American, Male >90 >=60 mL/min/1.42m2    EGFR CKD-EPI African American, Male >90 >=60 mL/min/1.68m2    Glucose 95 65 - 179 mg/dL    Calcium 9.7 8.5 - 04.5 mg/dL    Albumin 3.9 3.5 - 5.0 g/dL    Total Protein 6.9 6.5 - 8.3 g/dL    Total Bilirubin 1.5 (H) 0.0 - 1.2 mg/dL    AST  19 - 55 U/L    ALT 14 (L) 19 - 72 U/L    Alkaline Phosphatase 64 38 - 126 U/L   CBC w/ Differential    Collection Time: 04/20/18 12:25 PM   Result Value Ref Range    WBC 8.9 4.5 - 11.0 10*9/L    RBC 5.07 4.50 - 5.90 10*12/L    HGB 15.4 13.5 - 17.5 g/dL    HCT 40.9 81.1 - 91.4 %    MCV 94.4 80.0 - 100.0 fL    MCH 30.5 26.0 - 34.0 pg    MCHC 32.3 31.0 - 37.0 g/dL    RDW 78.2 95.6 - 21.3 %    MPV 7.8 7.0 - 10.0 fL    Platelet 260 150 - 440 10*9/L    Neutrophils % 74.5 %    Lymphocytes % 11.9 %    Monocytes % 8.8 %    Eosinophils % 2.0 %    Basophils % 0.5 %    Absolute Neutrophils 6.6 2.0 - 7.5 10*9/L    Absolute Lymphocytes 1.1 (L) 1.5 -  5.0 10*9/L    Absolute Monocytes 0.8 0.2 - 0.8 10*9/L    Absolute Eosinophils 0.2 0.0 - 0.4 10*9/L    Absolute Basophils 0.1 0.0 - 0.1 10*9/L    Large Unstained Cells 2 0 - 4 %    Hypochromasia Slight (A) Not Present   PT-INR    Collection Time: 04/20/18 12:29 PM   Result Value Ref Range    PT 12.7 10.2 - 12.8 sec    INR 1.11          Micro:  Microbiology Results (last day)     ** No results found for the last 24 hours. **            Imaging:     Pvl Hemodialysis Graft/fistula Duplex    Result Date: 04/20/2018    Peripheral Vascular Lab     [address]   [place], [place] [address]  PVL HEMODIALYSIS GRAFT-FISTULA DUPLEX Patient Demographics Pt. Name: Johnny Evans Location: Emergency Department MRN:      [id number]       Sex:      M DOB:      11-09-1975      Age:      68 years  Study Information Authorizing         [id number] Beatrix Shipper      Performed Time       04/20/2018 1:24:41 Provider Name       BLEVINS                                    PM Ordering Physician  Vertell Novak      Patient Location     [facility] Accession Number    [id number] UN         Technologist         Blondell Reveal Welton Flakes RVT Diagnosis:                                Assisting                                           Technologist Ordered Reason For Exam: fistula clotted?  Reason For Exam = No palpable thrill for AVF/AVG. Access site = Left Upper Extremity. Access Type = brachial-basilic AVF.  Examination Protocol: The dialysis access is interrogated using Doppler ultrasound. B-mode imaging and color Doppler are used to evaluate for diameters, depths, velocities, flow volumes, and abnormalities.When arterial steal is suspected by ordering physician, PW Doppler signals are evaluated at the wrist and PPG tracings are obtained from the digits. In the presence of retrograde flow at the wrist or nonpulsatile PPG tracings, dialysis access may be momentarily occluded to evaluate hemodynamic changes. In the presence of inadaquate fistula maturation, the afferant arteries and efferent veins are evaluated for obstruction. In the presence of suspected venous hypertension, the central veins are evaluated for stenosis.  Findings: +----------------------------+--------+------+---+-----------+ -Brachio-cephalic/basilic AVF-Diameter-Depth -PSV-Flow Volume- +----------------------------+--------+------+---+-----------+ -Distal Brachial Artery      -9.6 mm  -2.2 mm-   -           - +----------------------------+--------+------+---+-----------+ -AVF anastomosis             -5.7 mm  -2.2 mm-   -           - +----------------------------+--------+------+---+-----------+  No flow was noted in the upper extremity AVF throughout. This is a new finding since the prior exam on 08/16/2017.     --------------------------------------------------------------------------------  Preliminary

## 2018-04-20 NOTE — Unmapped (Signed)
Patient rounding complete, call bell in reach, bed locked and in lowest position, patient belongings at bedside and within reach of patient.  Patient updated on plan of care.

## 2018-04-20 NOTE — Unmapped (Signed)
Patient s/p kidney transplant in 2015 p/w increased pain to LUE AV fistula.

## 2018-04-20 NOTE — Unmapped (Signed)
Re: consult for painful fistula:  [facility] scheduler said soonest available in September. In basket msg, voice mail [phone] (7/30) and email (8/1) to Dr. Horace Porteous office  attempting to get pt in sooner than September which was suggestion of [employer].

## 2018-04-20 NOTE — Unmapped (Signed)
[facility]  Emergency Department Provider Note    ED Clinical Impression     Final diagnoses:   AV fistula occlusion, initial encounter (CMS-HCC) (Primary)     Initial Impression, ED Course, Assessment and Plan     Impression:   Johnny Evans is a 42 y.o. male with past medical history of kidney transplant in 2015 secondary to FSGS and hypertension who was previously on dialysis through a left upper extremity fistula with other comorbid conditions including hypertension, hyperlipidemia and gout who presents with left upper extremity pain and swelling and concerned that he might have a clot.      On arrival to the emergency department vital signs within normal limits.  Patient afebrile, alert and oriented x3, pleasant nontoxic and in no acute distress.  Physical exam as noted below, left upper extremity upper arm fistula site appears somewhat firm without palpable thrill.  Does have a palpable pulse.  There is no signs of erythema drainage or open wounds.  No signs of infectious process.  Is minimally tender to palpation.    Differential concerning for occlusion of the fistula and possible central venous thrombosis in the left upper extremity.  His hand is warm and well-perfused and is neurovascularly intact.  I have very low suspicion for infection of the graft.  It is not an active fistula and is not been accessed in over 4 years.  I will get basic labs including coags as well as a PV ultrasound of the fistula.  Anticipate discussion with vascular surgery pending imaging.    ED Course as of Apr 20 1645   Thu Apr 20, 2018   1352 PVL LUE:  No flow was noted in the upper extremity AVF throughout. This is a new finding since the prior exam on 08/16/2017.      1352 Vascular surg paged.       1403 Discussed with vascular surgery who pointed out that fistula was placed by transplant surgery.  Recommended paging them.  Transplant surgery paged      1458 Vascular surgery at bedside.  Recommended full left upper arm PVL.  Ordered.  Will reassess once imaging completed.     Patient care assumed by oncoming resident      1500 Patient care assumed by oncoming provider          Additional Medical Decision Making     I have reviewed the vital signs and the nursing notes. Labs and radiology results that were available during my care of the patient were independently reviewed by me and considered in my medical decision making.     I independently visualized the radiology images.   I reviewed the patient's prior medical records (previous transplant notes).   I discussed the case with the vascular surgery consultant.     I staffed the case with the ED attending, Dr. [name].    Portions of this record have been created using Scientist, clinical (histocompatibility and immunogenetics). Dictation errors have been sought, but may not have been identified and corrected.  ____________________________________________    History     Chief Complaint  Dialysis Shunt Problem    HPI   Johnny Evans is a 42 y.o. male with past medical history of kidney transplant in 2015 secondary to FSGS and hypertension who was previously on dialysis through a left upper extremity fistula with other comorbid conditions including hypertension, hyperlipidemia and gout who presents with left upper extremity pain and swelling and concerned that he might have a clot.  Fistula is no longer active secondary to his kidney transplant.  Is otherwise in good health.  Pain is been worsening over last few days but was worse earlier this morning.  Patient attempted to be seen in clinic sooner via telephone encounter but was advised to come to the emergency department.    Past Medical History:   Diagnosis Date   ??? 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 Dialysis patient (CMS-HCC)    ??? ESRD (end stage renal disease) (CMS-HCC)    ??? FSGS (focal segmental glomerulosclerosis)    ??? Gout    ??? High cholesterol    ??? HTN (hypertension)    ??? Immunosuppression (CMS-HCC) 04/23/2015   ??? Kidney disease     ESRD 2/2 FSGS and HTN on HD MWF; on transplant list   ??? Kidney transplant 05/23/2014 06/23/2014     Patient Active Problem List   Diagnosis   ??? Asthma without status asthmaticus   ??? Gout   ??? Hyperlipidemia   ??? Hypertension   ??? Kidney transplant 05/23/2014   ??? Paroxysmal atrial fibrillation (CMS-HCC)   ??? Immunosuppression (CMS-HCC)     Past Surgical History:   Procedure Laterality Date   ??? CYST REMOVAL      Feb 2014   ??? NEPHRECTOMY TRANSPLANTED ORGAN     ??? PERITONEAL CATHETER INSERTION  07/19/2013   ??? PERITONEAL CATHETER REMOVAL  December 2014    Did not tolerate distension and symptoms of PD at home   ??? PR TRANSPLANTATION OF KIDNEY N/A 05/23/2014    Procedure: RENAL ALLOTRANSPLANTATION, IMPLANTATION OF GRAFT; WITHOUT RECIPIENT NEPHRECTOMY;  Surgeon: Purcell Mouton, MD;  Location: MAIN OR [facility];  Service: Transplant   ??? RENAL BIOPSY      2005     No current facility-administered medications for this encounter.     Current Outpatient Medications:   ???  albuterol 2.5 mg /3 mL (0.083 %) nebulizer solution, Inhale 2.5 mg., Disp: , Rfl:   ???  aspirin (ECOTRIN) 81 MG tablet, Take 81 mg by mouth daily., Disp: , Rfl:   ???  azaTHIOprine (IMURAN) 50 mg tablet, Take 3 tablets (150 mg total) by mouth daily., Disp: 270 tablet, Rfl: 3  ???  cholecalciferol, vitamin D3, (VITAMIN D3) 1,000 unit capsule, Take 1,000 Units by mouth daily., Disp: , Rfl:   ???  magnesium oxide (MAG-OX) 400 mg tablet, Take 1 tablet (400 mg total) by mouth daily., Disp: 30 tablet, Rfl: 0  ???  metoprolol succinate (TOPROL-XL) 50 MG 24 hr tablet, Take 1 tablet (50 mg total) by mouth daily., Disp: 30 tablet, Rfl: 11  ???  OMEGA-3/DHA/EPA/FISH OIL (FISH OIL-OMEGA-3 FATTY ACIDS) 300-1,000 mg capsule, Take 2 capsules (2 g total) by mouth daily., Disp: 90 capsule, Rfl: 0  ???  sertraline (ZOLOFT) 100 MG tablet, Take 1 tablet (100 mg total) by mouth daily., Disp: 90 tablet, Rfl: 3  ???  tacrolimus (PROGRAF) 1 MG capsule, Take 2mg  in the am and 2mg  in the pm, Z94.0, DAW1, Disp: 150 capsule, Rfl: 11 Allergies  Ace inhibitors  Family History   Problem Relation Age of Onset   ??? Hypertension Mother    ??? Kidney disease Neg Hx      Social History  Social History     Tobacco Use   ??? Smoking status: Former Smoker     Last attempt to quit: 07/18/2011     Years since quitting: 6.7   ??? Smokeless tobacco: Never Used   Substance Use Topics   ???  Alcohol use: No     Alcohol/week: 0.0 standard drinks   ??? Drug use: No     Review of Systems:  A 12 point review of systems was negative except for pertinent items noted in the HPI.     Physical Exam     ED Triage Vitals [04/20/18 1130]   Enc Vitals Group      BP 115/73      Heart Rate 76      SpO2 Pulse       Resp 16      Temp 36.6 ??C (97.9 ??F)      Temp Source Temporal      SpO2 99 %     Constitutional: Alert and oriented x3. Appears stated age, in no acute distress.  Eyes: Conjunctivae are normal.  ENT       Head: Normocephalic and atraumatic.       Nose: No congestion.       Mouth/Throat: Mucous membranes are moist.       Neck: No stridor.  Hematological/Lymphatic/Immunilogical: No cervical lymphadenopathy.  Cardiovascular: Normal rate, regular rhythm. Normal and symmetric distal pulses are present in all extremities.  Respiratory: Normal respiratory effort. Breath sounds are normal.  Gastrointestinal: Soft and nontender. There is no CVA tenderness.  Musculoskeletal: Normal range of motion in all extremities.  Left upper extremity upper arm fistula site appears somewhat firm without palpable thrill.  Does have a palpable pulse.  There is no signs of erythema drainage or open wounds.  No signs of infectious process.  Is minimally tender to palpation.       Right lower leg: No tenderness or edema.       Left lower leg: No tenderness or edema.  Neurologic: Normal speech and language. No gross focal neurologic deficits are appreciated.  Skin: Skin is warm, dry and intact. No rash noted.  Psychiatric: Mood and affect are normal. Speech and behavior are normal.    Radiology     PVL Venous Duplex Upper Extremity Left   Final Result      PVL Hemodialysis Graft/Fistula Duplex   Final Result          Procedures     As noted in procedure notes.        Dionne Bucy, MD  Resident  04/20/18 [phone]

## 2018-04-20 NOTE — Unmapped (Signed)
Pt returned from PVL 

## 2018-04-20 NOTE — Unmapped (Addendum)
Pt contacted former [facility] Christen Bame, RN w/ c/o warm, painful fistula, and fever. Dr Toni Arthurs instructed that patient go to ER. Spoke w/Nettie Broadus John and he states he will head to [facility] ER soon.

## 2018-04-20 NOTE — Unmapped (Signed)
Patient rounding complete, call bell in reach, bed locked and in lowest position, patient belongings at bedside and within reach of patient.  Patient updated on plan of care.    Vasc surg at bedside

## 2018-04-20 NOTE — Unmapped (Signed)
Patient transported to PVL  Transported by Radiology  How tranported Stretcher  Cardiac Monitor no

## 2018-04-21 NOTE — Unmapped (Addendum)
Pt scheduled to see Dr. Onalee Hua, Vascular Access Clinic, [facility] on 05/25/18. This was the soonest available per [employer], Production designer, theatre/television/film.    Imaging of graft and report forwarded to Dr. Norma Fredrickson cc Dr Toni Arthurs.     04/24/18 1230 Update: Earlier appt scheduled for 05/04/18 0900 with Dr. Norma Fredrickson per [facility] via e-mail.

## 2018-04-24 NOTE — Unmapped (Signed)
Current Outpatient Medications on File Prior to Visit   Medication Sig   ??? albuterol 2.5 mg /3 mL (0.083 %) nebulizer solution Inhale 2.5 mg.   ??? aspirin (ECOTRIN) 81 MG tablet Take 81 mg by mouth daily.   ??? azaTHIOprine (IMURAN) 50 mg tablet Take 3 tablets (150 mg total) by mouth daily.   ??? cholecalciferol, vitamin D3, (VITAMIN D3) 1,000 unit capsule Take 1,000 Units by mouth daily.   ??? magnesium oxide (MAG-OX) 400 mg tablet Take 1 tablet (400 mg total) by mouth daily.   ??? metoprolol succinate (TOPROL-XL) 50 MG 24 hr tablet Take 1 tablet (50 mg total) by mouth daily.   ??? OMEGA-3/DHA/EPA/FISH OIL (FISH OIL-OMEGA-3 FATTY ACIDS) 300-1,000 mg capsule Take 2 capsules (2 g total) by mouth daily.   ??? sertraline (ZOLOFT) 100 MG tablet Take 1 tablet (100 mg total) by mouth daily.   ??? tacrolimus (PROGRAF) 1 MG capsule Take 2mg  in the am and 2mg  in the pm, Z94.0, DAW1     No current facility-administered medications on file prior to visit.

## 2018-04-25 NOTE — Unmapped (Signed)
[facility] Refill and Clinical Coordination Note  Medication(s): PROGRAF    David Stall, DOB: [birth year]  Phone: [phone] (home) , Alternate phone contact: N/A  Shipping address: [address]  [place]  [address]  Phone or address changes today?: No  All above HIPAA information verified.  Insurance changes? No    Completed refill and clinical call assessment today to schedule patient's medication shipment from the [facility] [phone]).      MEDICATION RECONCILIATION    Confirmed the medication and dosage are correct and have not changed: Yes, regimen is correct and unchanged.    Were there any changes to your medication(s) in the past month:  No, there are no changes reported at this time.    ADHERENCE    Is this medicine transplant or covered by Medicare Part B? Yes.    Prograf 1 mg   Quantity filled last month: 120   # of tablets left on hand: 11 DAYS LEFT    Did you miss any doses in the past 4 weeks? No missed doses reported.  Adherence counseling provided? Not needed     SIDE EFFECT MANAGEMENT    Are you tolerating your medication?:  Tremar reports tolerating the medication.  Side effect management discussed: None      Therapy is appropriate and should be continued.    Evidence of clinical benefit: See Epic note from 05/03/17      FINANCIAL/SHIPPING    Delivery Scheduled: Yes, Expected medication delivery date: 05/04/18 VIA UPS - k note added that pt requests pm delivery. also told pt to use ups mychoice to also req pm delivery   Additional medications refilled: No additional medications/refills needed at this time.    The patient will receive an FSI print out for each medication shipped and additional FDA Medication Guides as required.  Patient education from [place] or Robet Leu may also be included in the shipment.    Mikell did not have any additional questions at this time.    Delivery address validated in FSI scheduling system: Yes, address listed above is correct.      We will follow up with patient monthly for standard refill processing and delivery.      Thank you,  Thad Ranger   [facility] Specialty Pharmacist

## 2018-04-28 NOTE — Unmapped (Signed)
Epic [facility]) medication reconciliation is completed.

## 2018-05-03 MED FILL — PROGRAF 1 MG CAPSULE: 30 days supply | Qty: 120 | Fill #0

## 2018-05-03 MED FILL — PROGRAF 1 MG CAPSULE: 30 days supply | Qty: 120 | Fill #0 | Status: AC

## 2018-05-31 NOTE — Unmapped (Signed)
[facility] Specialty Pharmacy Refill Coordination Note    Specialty Medication(s) to be Shipped:   Transplant: Prograf 1mg      Johnny Evans, DOB: 01-Mar-1976  Phone: [phone] (home)   Shipping Address: [address]  [place] [address]    All above HIPAA information was verified with patient.     Completed refill call assessment today to schedule patient's medication shipment from the [facility] [phone]).       Specialty medication(s) and dose(s) confirmed: Regimen is correct and unchanged.   Changes to medications: Johnny Evans reports no changes reported at this time.  Changes to insurance: No  Questions for the pharmacist: No    The patient will receive a drug information handout for each medication shipped and additional FDA Medication Guides as required.      DISEASE/MEDICATION-SPECIFIC INFORMATION        N/A    ADHERENCE     Medication Adherence    Patient reported X missed doses in the last month:  0         MEDICARE PART B DOCUMENTATION     Prograf 1mg : Patient has 10 days worth of capsules on hand.    SHIPPING     Shipping address confirmed in Epic.     Delivery Scheduled: Yes, Expected medication delivery date: 06/09/2018 via UPS or courier.     Lamiah Marmol Leodis Binet   [facility] Shared [facility] Specialty Technician

## 2018-06-05 MED ORDER — PROGRAF 1 MG CAPSULE
ORAL_CAPSULE | 12 refills | 0 days | Status: CP
Start: 2018-06-05 — End: 2018-06-10
  Filled 2018-06-08: qty 120, 30d supply, fill #0

## 2018-06-08 MED FILL — PROGRAF 1 MG CAPSULE: 30 days supply | Qty: 120 | Fill #0 | Status: AC

## 2018-06-10 MED ORDER — PREDNISONE 5 MG TABLET
ORAL_TABLET | Freq: Every day | ORAL | 0 refills | 0 days | Status: CP
Start: 2018-06-10 — End: 2018-06-12

## 2018-06-10 MED ORDER — PROGRAF 1 MG CAPSULE
ORAL_CAPSULE | Freq: Two times a day (BID) | ORAL | 0 refills | 0 days | Status: CP
Start: 2018-06-10 — End: 2018-06-12

## 2018-06-12 MED ORDER — PROGRAF 1 MG CAPSULE
ORAL_CAPSULE | Freq: Two times a day (BID) | ORAL | 11 refills | 0.00000 days | Status: CP
Start: 2018-06-12 — End: 2019-03-06
  Filled 2018-07-06: qty 120, 30d supply, fill #0

## 2018-06-12 MED ORDER — PREDNISONE 5 MG TABLET
ORAL_TABLET | Freq: Every day | ORAL | 11 refills | 0.00000 days | Status: CP
Start: 2018-06-12 — End: 2018-06-14

## 2018-06-12 NOTE — Unmapped (Signed)
Patient paged Sunday stating he needed enough Prograf and Prednisone; felt the [facility] shoul dhave delivered his medication but it did not arrive. [facility] states should have arrived 9/20 but tracking states today. Sent short script to local pharmacy for patient to pay out of pocket.

## 2018-06-13 NOTE — Unmapped (Signed)
Still no medications from [facility]. Pharmacy states he is missing the attempts to deliver. He states no notification. I asked the [facility] to call him. Primary coordinator [employer] notified

## 2018-06-13 NOTE — Unmapped (Signed)
[facility] Specialty Medication Referral: No PA required    Medication (Brand/Generic): prednisone    Initial Benefits Investigation Claim completed with resulted information below:  No PA required  Patient ABLE to fill at [facility] Company:  Medicare Part B  Anticipated Copay: $3.65    As Co-pay is under $25 defined limit, per policy there will be no further investigation of need for financial assistance at this time unless patient requests. This referral has been communicated to the provider and handed off to the [facility] team for further processing and filling of prescribed medication.   ______________________________________________________________________  Please utilize this referral for viewing purposes as it will serve as the central location for all relevant documentation and updates.

## 2018-06-14 MED ORDER — PREDNISONE 5 MG TABLET
ORAL_TABLET | Freq: Every day | ORAL | 3 refills | 0 days | Status: CP
Start: 2018-06-14 — End: 2019-06-14

## 2018-06-14 NOTE — Unmapped (Signed)
Reordered prednisone rx for [facility] [address] in [place] per patient request via in basket.

## 2018-06-19 NOTE — Unmapped (Signed)
[employer] transplant nephrology clinic visit    assessment and plan:  1. s/p kidney transplant 05/23/2014. baseline creatinine 1-1.2 mg/dl. no proteinuria. no donor specific hla ab detected. no change in maintenance immunosuppression. tacrolimus 12hr goal 5-8 ng/ml. consistently elevated >8, will reduce tac to 2mg  bid.  2. bkv nephropathy. resolved. bkv pcr undetectable x29m.  3. hypertension. blood pressure goal < 140/90 mmhg.  4. atrial fibrillation. patient currently reports ~one episode per week. will refer to cardiology, last seen 3 years ago. low AF burden and CHADS2Vasc of 1. On metoprolol and aspirin.  5. depression. currently on sertraline 100mg  daily, will consider increasing dose. will refer to transplant psychology.  6. preventive medicine/vaccination. influenza '17. pcv13 pneumococcal '14. ppsv23 pneumococcal '16.    history of present illness:  mr. Johnny Evans is a 42 y.o. gentleman seen in follow up post kidney transplant 05/23/2014. since last seen, he reports that he was able to return to work about 6 months ago but had to stop due to the early morning shift schedule exacerbating his a-fib. he currently experiences episodes once a week for about two minutes, +shortness of breath, +chest pain, and +fluttering. his primary complaint is lack of sleep making him constantly fatigued. he has also been having difficulty with his 2 y.o. son, who was recently diagnosed with autism and is currently undergoing developmental studies. denies dysuria, hematuria, or difficulty emptying bladder. denies fevers, chills, sweats, headache, or lightheadedness. also denies nausea, vomiting, diarrhea. all other symptoms reveiwed and negative x10 systems. last took prograf at 10pm.    past medical hx:  1. 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 focal segmental glomerulosclerosis. alemtuzumab induction. baseline creatinine 1-1.2 mg/dl.  > kidney bx 12/15: +stage1 bk nephropathy. no acute/chr rejection.  2. hypertension  3. hyperlipidemia  4. echocardiogram '14: nl lv. lvef > 55%. diastolic lv dysfunction.  5. atrial fibrillation  6. gout  7. hx asthma/reactive airways disease    past surgical hx: left upper ext av fistula '10. kidney transplant '15.    all: ace inh    medications: tacrolimus 2mg  bid, azathioprine 150mg  daily, prednisone 5mg  daily, metoprolol xl 25mg  daily, omega3 fish oil 2gm daily, aspirin 81mg  daily, sertraline 100mg  daily, vitamin d3 1,000u daily.    soc hx: married, currently living with wife x2 sons, younger being evaluated for autism. former smoking hx.    physical exam:  BP 128/82  - Pulse 64  - Temp 35.6 ??C (96.1 ??F) (Tympanic)  - Resp 18  - Ht 185.4 cm (6' 0.99)  - Wt (!) 113 kg (249 lb 3.2 oz)  - BMI 32.89 kg/m??   gwd/wn gentleman nl/appropriate affect and mood. nl sclera anicteric. mmm nl dentition no thrush. neck supple no palpable ln. heart rrr nl s1s2. lungs clear bilateral. abd soft nt/nd. no lower ext edema. left upper ext av fistula +bruit/thrill. 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 msk no synovitis/tophi. skin no rash. neuro alert oriented non focal exam.    labs:  Results for orders placed or performed in visit on 03/04/17   Tacrolimus Level, Trough ([place])   Result Value Ref Range    Tacrolimus, Trough 8.7 <=20.0 ng/mL       Scribe's Attestation: Verl Blalock, MD obtained and performed the history, physical exam and medical decision making elements that were entered into the chart.  Signed by Gaye Alken, Scribe, on May 03, 2017 at 8:35 AM.    ----------------------------------------------------------------------------------------------------------------------  May 03, 2017 1:26 PM.  documentation assistance provided by the scribe. i was present during the time the encounter was recorded. the information recorded by the scribe was done at my direction and has been reviewed and validated by me.     Verl Blalock  Nephrology and Hypertension, PGY5  Pager: [phone]  May 03, 2017 1:26 PM  ----------------------------------------------------------------------------------------------------------------------    attending note: I saw and evaluated the patient participating in the key portions of the service. I reviewed the resident's note. I agree with the resident's assessment and plan.    Prince Rome MD  Date of Service 05/03/2017

## 2018-06-28 NOTE — Unmapped (Signed)
[facility] Specialty Pharmacy Refill Coordination Note    Specialty Medication(s) to be Shipped:   Transplant: Prograf 1mg   **Pt is getting prednisone from local pharmacy**     David Stall, DOB: Dec 16, 1975  Phone: [phone] (home)   Shipping Address: [address]  [place] [address]    All above HIPAA information was verified with patient.     Completed refill call assessment today to schedule patient's medication shipment from the [facility] [phone]).       Specialty medication(s) and dose(s) confirmed: Regimen is correct and unchanged.   Changes to medications: Demaris reports no changes reported at this time.  Changes to insurance: No  Questions for the pharmacist: No    The patient will receive a drug information handout for each medication shipped and additional FDA Medication Guides as required.      DISEASE/MEDICATION-SPECIFIC INFORMATION        N/A    ADHERENCE     Medication Adherence    Patient reported X missed doses in the last month:  0  Specialty Medication:  Prograf 1mg   Patient is on additional specialty medications:  No  Patient is on more than two specialty medications:  No  Any gaps in refill history greater than 2 weeks in the last 3 months:  no  Demonstrates understanding of importance of adherence:  yes  Informant:  patient  Reliability of informant:  reliable  Reasons for non-adherence:  patient forgets  Adherence tools used:  patient uses a pill box to manage medications  Confirmed plan for next specialty medication refill:  delivery by pharmacy          Refill Coordination    Has the Patients' Contact Information Changed:  No  Is the Shipping Address Different:  No         MEDICARE PART B DOCUMENTATION     Prograf 1mg : Patient has 10 days worth of capsules on hand.    SHIPPING     Shipping address confirmed in Epic.     Delivery Scheduled: Yes, Expected medication delivery date: 07/07/2018 via UPS or courier.     Haidar Muse Leodis Binet   [facility] Shared [facility] Specialty Technician

## 2018-07-06 MED FILL — PROGRAF 1 MG CAPSULE: 30 days supply | Qty: 120 | Fill #0 | Status: AC

## 2018-07-17 LAB — MAGNESIUM: Lab: 0

## 2018-07-18 MED ORDER — SERTRALINE 100 MG TABLET
ORAL_TABLET | Freq: Every day | ORAL | 3 refills | 0.00000 days | Status: CP
Start: 2018-07-18 — End: 2018-11-21

## 2018-08-02 NOTE — Unmapped (Signed)
[facility] Specialty Pharmacy Refill Coordination Note    Specialty Medication(s) to be Shipped:   Transplant: Prograf 1mg      Johnny Evans, DOB: 01/15/1976  Phone: [phone] (home)       All above HIPAA information was verified with patient.     Completed refill call assessment today to schedule patient's medication shipment from the [facility] [phone]).       Specialty medication(s) and dose(s) confirmed: Regimen is correct and unchanged.   Changes to medications: Johnny Evans reports no changes reported at this time.  Changes to insurance: No  Questions for the pharmacist: No    The patient will receive a drug information handout for each medication shipped and additional FDA Medication Guides as required.      DISEASE/MEDICATION-SPECIFIC INFORMATION        N/A    ADHERENCE     Medication Adherence    Patient reported X missed doses in the last month:  2  Specialty Medication:  prograf 1mg   Patient is on additional specialty medications:  No  Patient is on more than two specialty medications:  No  Any gaps in refill history greater than 2 weeks in the last 3 months:  no  Demonstrates understanding of importance of adherence:  yes  Informant:  patient  Reliability of informant:  reliable  Patient is at risk for Non-Adherence:  Yes  The following intervention(s) were discussed with the patient:  Schedule refills ahead of time      Adherence tools used:  patient uses a pill box to manage medications          Confirmed plan for next specialty medication refill:  delivery by pharmacy  Refills needed for supportive medications:  not needed          Refill Coordination    Has the Patients' Contact Information Changed:  No  Is the Shipping Address Different:  No         MEDICARE PART B DOCUMENTATION     Prograf 1mg : Patient has 7 days capsules on hand.    SHIPPING     Shipping address confirmed in Epic.     Delivery Scheduled: Yes, Expected medication delivery date: 08/08/2018 via UPS or courier. Medication will be delivered via UPS to the home address in [place].    Johnny Evans P Allena Katz   [facility] Specialty Technician- BP/JMG

## 2018-08-07 MED FILL — PROGRAF 1 MG CAPSULE: 30 days supply | Qty: 120 | Fill #1 | Status: AC

## 2018-08-07 MED FILL — PROGRAF 1 MG CAPSULE: ORAL | 30 days supply | Qty: 120 | Fill #1

## 2018-09-01 NOTE — Unmapped (Signed)
[facility] Specialty Pharmacy Refill Coordination Note    Specialty Medication(s) to be Shipped:   Transplant: Prograf 1mg      David Stall, DOB: [birth year]  Phone: [phone] (home)     All above HIPAA information was verified with patient.     Completed refill call assessment today to schedule patient's medication shipment from the [facility] [phone]).       Specialty medication(s) and dose(s) confirmed: Regimen is correct and unchanged.   Changes to medications: Carrick reports no changes reported at this time.  Changes to insurance: No  Questions for the pharmacist: No    The patient will receive a drug information handout for each medication shipped and additional FDA Medication Guides as required.      DISEASE/MEDICATION-SPECIFIC INFORMATION        N/A    ADHERENCE     Medication Adherence    Patient reported X missed doses in the last month:  0  Specialty Medication:  Prograf 1mg    Patient is on additional specialty medications:  No  Patient is on more than two specialty medications:  No  Any gaps in refill history greater than 2 weeks in the last 3 months:  no  Demonstrates understanding of importance of adherence:  yes  Informant:  patient  Reliability of informant:  reliable      Adherence tools used:  patient uses a pill box to manage medications          Confirmed plan for next specialty medication refill:  delivery by pharmacy          Refill Coordination    Has the Patients' Contact Information Changed:  No  Is the Shipping Address Different:  No         MEDICARE PART B DOCUMENTATION     Prograf 1mg : Patient has 7 days worth of capsules on hand.    SHIPPING     Shipping address confirmed in Epic.     Delivery Scheduled: Yes, Expected medication delivery date: 09/06/2018 via UPS or courier.     Medication will be delivered via UPS to the home address in [place].    Gervase Colberg P Allena Katz   [facility] Shared [facility] Specialty Technician

## 2018-09-05 MED FILL — PROGRAF 1 MG CAPSULE: 30 days supply | Qty: 120 | Fill #2 | Status: AC

## 2018-09-05 MED FILL — PROGRAF 1 MG CAPSULE: ORAL | 30 days supply | Qty: 120 | Fill #2

## 2018-09-22 NOTE — Unmapped (Signed)
Spoke to patient regarding refill and he reported having around 3 weeks supply on hand. Follow up call scheduled for next week to reassess

## 2018-10-02 NOTE — Unmapped (Signed)
[facility] Specialty Pharmacy Refill and Clinical Coordination Note  Medication(s): Prograf 1mg     Johnny Evans, DOB: May 24, 1976  Phone: [phone] (home) , Alternate phone contact: N/A  Shipping address: [address]  [place] [address]  Phone or address changes today?: No  All above HIPAA information verified.  Insurance changes? No    Completed refill and clinical call assessment today to schedule patient's medication shipment from the [facility] [phone]).      MEDICATION RECONCILIATION    Confirmed the medication and dosage are correct and have not changed: Yes, regimen is correct and unchanged.    Were there any changes to your medication(s) in the past month:  No, there are no changes reported at this time.    ADHERENCE    Is this medicine transplant or covered by Medicare Part B? Yes.    Prograf 1mg : Patient has 28 capsules on hand.    Did you miss any doses in the past 4 weeks? No missed doses reported.  Adherence counseling provided? Not needed     SIDE EFFECT MANAGEMENT    Are you tolerating your medication?:  Johnny Evans reports tolerating the medication.  Side effect management discussed: None      Therapy is appropriate and should be continued.    Evidence of clinical benefit: Do you feel that that the medication is helping? Yes      FINANCIAL/SHIPPING    Delivery Scheduled: Yes, Expected medication delivery date: 10/04/2018     Medication will be delivered via UPS to the home address in [facility].    Additional medications refilled: No additional medications/refills needed at this time.    The patient will receive a drug information handout for each medication shipped and additional FDA Medication Guides as required.      Johnny Evans did not have any additional questions at this time.    Delivery address confirmed in Epic.     We will follow up with patient monthly for standard refill processing and delivery.      Thank you,  Roderic Palau   [facility] Shared [facility] Specialty Pharmacist

## 2018-10-03 MED FILL — PROGRAF 1 MG CAPSULE: 30 days supply | Qty: 120 | Fill #3 | Status: AC

## 2018-10-03 MED FILL — PROGRAF 1 MG CAPSULE: ORAL | 30 days supply | Qty: 120 | Fill #3

## 2018-10-27 NOTE — Unmapped (Signed)
[facility] Specialty Pharmacy Refill Coordination Note    Specialty Medication(s) to be Shipped:   Transplant: Prograf 1mg      David Stall, DOB: 1976/03/10  Phone: [phone] (home)     All above HIPAA information was verified with patient.     Completed refill call assessment today to schedule patient's medication shipment from the [facility] [phone]).       Specialty medication(s) and dose(s) confirmed: Regimen is correct and unchanged.   Changes to medications: Kadeem reports no changes reported at this time.  Changes to insurance: No  Questions for the pharmacist: No    The patient will receive a drug information handout for each medication shipped and additional FDA Medication Guides as required.      DISEASE/MEDICATION-SPECIFIC INFORMATION        N/A    ADHERENCE     Medication Adherence    Patient reported X missed doses in the last month:  0  Specialty Medication:  Prograf 1mg    Patient is on additional specialty medications:  No  Patient is on more than two specialty medications:  No  Any gaps in refill history greater than 2 weeks in the last 3 months:  no  Demonstrates understanding of importance of adherence:  yes  Informant:  patient  Reliability of informant:  reliable  Adherence tools used:  patient uses a pill box to manage medications  Confirmed plan for next specialty medication refill:  delivery by pharmacy          Refill Coordination    Has the Patients' Contact Information Changed:  No  Is the Shipping Address Different:  No         MEDICARE PART B DOCUMENTATION     Prograf 1mg : Patient has 10 days worth of capsules on hand.    SHIPPING     Shipping address confirmed in Epic.     Delivery Scheduled: Yes, Expected medication delivery date: 11/06/2018 via UPS or courier.     Medication will be delivered via UPS to the home address in [place].    Corleone Biegler P Allena Katz   [facility] Shared [facility] Specialty Technician

## 2018-11-03 MED FILL — PROGRAF 1 MG CAPSULE: 30 days supply | Qty: 120 | Fill #4 | Status: AC

## 2018-11-03 MED FILL — PROGRAF 1 MG CAPSULE: ORAL | 30 days supply | Qty: 120 | Fill #4

## 2018-11-21 DIAGNOSIS — F329 Major depressive disorder, single episode, unspecified: Principal | ICD-10-CM

## 2018-11-21 DIAGNOSIS — E559 Vitamin D deficiency, unspecified: Principal | ICD-10-CM

## 2018-11-21 DIAGNOSIS — Z940 Kidney transplant status: Principal | ICD-10-CM

## 2018-11-21 DIAGNOSIS — Z79899 Other long term (current) drug therapy: Principal | ICD-10-CM

## 2018-11-21 DIAGNOSIS — I10 Essential (primary) hypertension: Principal | ICD-10-CM

## 2018-11-21 MED ORDER — SERTRALINE 100 MG TABLET
ORAL_TABLET | Freq: Every day | ORAL | 3 refills | 0 days | Status: CP
Start: 2018-11-21 — End: 2019-11-21

## 2018-11-24 ENCOUNTER — Ambulatory Visit: Admit: 2018-11-24 | Discharge: 2018-11-25 | Payer: MEDICARE

## 2018-11-24 DIAGNOSIS — D899 Disorder involving the immune mechanism, unspecified: Principal | ICD-10-CM

## 2018-11-24 DIAGNOSIS — Z940 Kidney transplant status: Principal | ICD-10-CM

## 2018-11-24 LAB — CBC W/ AUTO DIFF
BASOPHILS ABSOLUTE COUNT: 0 10*9/L (ref 0.0–0.1)
BASOPHILS RELATIVE PERCENT: 0.3 %
EOSINOPHILS ABSOLUTE COUNT: 0.6 10*9/L — ABNORMAL HIGH (ref 0.0–0.4)
EOSINOPHILS RELATIVE PERCENT: 6.9 %
HEMOGLOBIN: 16.2 g/dL (ref 13.5–17.5)
LARGE UNSTAINED CELLS: 2 % (ref 0–4)
LYMPHOCYTES ABSOLUTE COUNT: 1 10*9/L — ABNORMAL LOW (ref 1.5–5.0)
LYMPHOCYTES RELATIVE PERCENT: 12.1 %
MEAN CORPUSCULAR HEMOGLOBIN CONC: 31.7 g/dL (ref 31.0–37.0)
MEAN CORPUSCULAR VOLUME: 92.8 fL (ref 80.0–100.0)
MEAN PLATELET VOLUME: 8.7 fL (ref 7.0–10.0)
MONOCYTES ABSOLUTE COUNT: 0.4 10*9/L (ref 0.2–0.8)
MONOCYTES ABSOLUTE COUNT: 0.4 10*9/L — ABNORMAL HIGH (ref 0.2–0.8)
MONOCYTES RELATIVE PERCENT: 5.3 %
NEUTROPHILS ABSOLUTE COUNT: 5.9 10*9/L (ref 2.0–7.5)
PLATELET COUNT: 245 10*9/L (ref 150–440)
RED BLOOD CELL COUNT: 5.49 10*12/L (ref 4.50–5.90)
RED CELL DISTRIBUTION WIDTH: 14.7 % (ref 12.0–15.0)
WBC ADJUSTED: 8 10*9/L (ref 4.5–11.0)

## 2018-11-24 LAB — COMPREHENSIVE METABOLIC PANEL
ALKALINE PHOSPHATASE: 82 U/L (ref 38–126)
ALT (SGPT): 12 U/L (ref <50)
ANION GAP: 9 mmol/L (ref 7–15)
AST (SGOT): 19 U/L (ref 19–55)
BILIRUBIN TOTAL: 0.4 mg/dL (ref 0.0–1.2)
BLOOD UREA NITROGEN: 12 mg/dL (ref 7–21)
BUN / CREAT RATIO: 13
CALCIUM: 9.9 mg/dL (ref 8.5–10.2)
CHLORIDE: 105 mmol/L (ref 98–107)
CHLORIDE: 105 mmol/L (ref 98–107)
CO2: 29 mmol/L (ref 22.0–30.0)
CREATININE: 0.89 mg/dL (ref 0.70–1.30)
EGFR CKD-EPI AA MALE: >90 mL/min/{1.73_m2} (ref >=60)
EGFR CKD-EPI NON-AA MALE: >90 mL/min/{1.73_m2} (ref >=60)
GLUCOSE RANDOM: 94 mg/dL (ref 70–179)
POTASSIUM: 4.3 mmol/L (ref 3.5–5.0)
PROTEIN TOTAL: 7 g/dL (ref 6.5–8.3)

## 2018-11-24 LAB — HEMOGLOBIN A1C: HEMOGLOBIN A1C: 5.8 % — ABNORMAL HIGH (ref 4.8–5.6)

## 2018-11-24 LAB — CMV DNA, QUANTITATIVE, PCR: CMV VIRAL LD: NOT DETECTED

## 2018-11-30 LAB — VITAMIN D 1,25 DIHYDROXY: VITAMIN D 1,25-DIHYDROXY: 56 pg/mL

## 2018-12-01 NOTE — Unmapped (Signed)
[facility] Specialty Pharmacy Refill Coordination Note    Specialty Medication(s) to be Shipped:   Transplant: Prograf 1mg     Other medication(s) to be shipped: n/a     Johnny Evans, DOB: July 28, 1976  Phone: [phone] (home)       All above HIPAA information was verified with patient.     Completed refill call assessment today to schedule patient's medication shipment from the [facility] [phone]).       Specialty medication(s) and dose(s) confirmed: Regimen is correct and unchanged.   Changes to medications: Johnny Evans reports no changes reported at this time.  Changes to insurance: No  Questions for the pharmacist: No    Confirmed patient received Welcome Packet with first shipment. The patient will receive a drug information handout for each medication shipped and additional FDA Medication Guides as required.       DISEASE/MEDICATION-SPECIFIC INFORMATION        N/A    SPECIALTY MEDICATION ADHERENCE     Medication Adherence    Patient reported X missed doses in the last month:  0  Specialty Medication:  Prograf 1 mg  Patient is on additional specialty medications:  No  Adherence tools used:  patient uses a pill box to manage medications                Prograf 1 mg: 7 days of medicine on hand          SHIPPING     Shipping address confirmed in Epic.     Delivery Scheduled: Yes, Expected medication delivery date: 12/05/18.     Medication will be delivered via UPS to the home address in [place].    Unk Lightning   [facility] Specialty Technician

## 2018-12-04 MED FILL — PROGRAF 1 MG CAPSULE: 30 days supply | Qty: 120 | Fill #5 | Status: AC

## 2018-12-04 MED FILL — PROGRAF 1 MG CAPSULE: ORAL | 30 days supply | Qty: 120 | Fill #5

## 2018-12-29 NOTE — Unmapped (Signed)
[facility] Specialty Pharmacy Refill Coordination Note    Specialty Medication(s) to be Shipped:   Transplant: Prograf 1mg     Other medication(s) to be shipped: n/a     Johnny Evans, DOB: 1976-07-26  Phone: [phone] (home)       All above HIPAA information was verified with patient.     Completed refill call assessment today to schedule patient's medication shipment from the [facility] [phone]).       Specialty medication(s) and dose(s) confirmed: Regimen is correct and unchanged.   Changes to medications: Johnny Evans reports no changes at this time.  Changes to insurance: No  Questions for the pharmacist: No    Confirmed patient received Welcome Packet with first shipment. The patient will receive a drug information handout for each medication shipped and additional FDA Medication Guides as required.       DISEASE/MEDICATION-SPECIFIC INFORMATION        N/A    SPECIALTY MEDICATION ADHERENCE     Medication Adherence    Patient reported X missed doses in the last month:  0  Specialty Medication:  prograf 1mg   Patient is on additional specialty medications:  No  Adherence tools used:  patient uses a pill box to manage medications                Prograf  1 mg: 7 days of medicine on hand *    SHIPPING     Shipping address confirmed in Epic.     Delivery Scheduled: Yes, Expected medication delivery date: 01/02/19.     Medication will be delivered via Next Day Courier to the home address in [employer] [place].    Johnny Evans  Anders Grant   [facility] Specialty Pharmacist

## 2018-12-29 NOTE — Unmapped (Signed)
[facility] Specialty Pharmacy Refill Coordination Note  Medication: Prograf 1mg     Unable to reach patient to schedule shipment for medication being filled at [facility]. Left voicemail on phone.  As this is the 2nd  unsuccessful attempt to reach the patient, no additional phone call attempts will be made at this time.      Phone numbers attempted: [phone]  Last scheduled delivery: 12/04/18    Please call the [facility] at [phone] (option 4) should you have any further questions.      Thanks,  [facility] Shared [employer] [facility] Specialty Team

## 2019-01-01 MED FILL — PROGRAF 1 MG CAPSULE: ORAL | 30 days supply | Qty: 120 | Fill #6

## 2019-01-01 MED FILL — PROGRAF 1 MG CAPSULE: 30 days supply | Qty: 120 | Fill #6 | Status: AC

## 2019-01-25 NOTE — Unmapped (Signed)
[facility] Refill Coordination Note    Specialty Medication(s) to be Shipped:   Transplant: Prograf 1mg     Other medication(s) to be shipped: na     David Stall, DOB: [birth year]  Phone: [phone] (home)       All above HIPAA information was verified with patient.     Completed refill call assessment today to schedule patient's medication shipment from the [facility] [phone]).       Specialty medication(s) and dose(s) confirmed: Regimen is correct and unchanged.   Changes to medications: Zephan reports no changes at this time.  Changes to insurance: No  Questions for the pharmacist: No    Confirmed patient received Welcome Packet with first shipment. The patient will receive a drug information handout for each medication shipped and additional FDA Medication Guides as required.       DISEASE/MEDICATION-SPECIFIC INFORMATION        N/A    SPECIALTY MEDICATION ADHERENCE     Medication Adherence    Patient reported X missed doses in the last month:  0  Specialty Medication:  Prograf 1 mg capsules  Patient is on additional specialty medications:  No  Patient is on more than two specialty medications:  No  Any gaps in refill history greater than 2 weeks in the last 3 months:  no  Demonstrates understanding of importance of adherence:  yes  Informant:  patient  Reliability of informant:  reliable  Adherence tools used:  patient uses a pill box to manage medications  Confirmed plan for next specialty medication refill:  delivery by pharmacy  Refills needed for supportive medications:  not needed          Refill Coordination    Has the Patients' Contact Information Changed:  No  Is the Shipping Address Different:  No         Prograf 1 mg capsules. Patient has 7 days upply remaining      SHIPPING     Shipping address confirmed in Epic.     Delivery Scheduled: Yes, Expected medication delivery date: 051220.     Medication will be delivered via UPS to the home address in [place].    Mirabella Hilario D Diane Mochizuki   [facility] Specialty Technician

## 2019-01-25 NOTE — Unmapped (Signed)
[facility] Pharm[facility] Refill Coordination Note  Medication: Prograf 1mg     Unable to reach patient to schedule shipment for medication being filled at [facility]. Left voicemail on phone.  As this is the 3rd unsuccessful attempt to reach the patient, no additional phone call attempts will be made at this time.      Phone numbers attempted: [phone] (H)    Last scheduled delivery: 01/01/2019    Please call the [facility] at [phone] (option 4) should you have any further questions.      Thanks,  [facility] Specialty Team

## 2019-01-29 MED FILL — PROGRAF 1 MG CAPSULE: 30 days supply | Qty: 120 | Fill #7 | Status: AC

## 2019-01-29 MED FILL — PROGRAF 1 MG CAPSULE: ORAL | 30 days supply | Qty: 120 | Fill #7

## 2019-02-23 NOTE — Unmapped (Signed)
[facility] Clinical Assessment & Refill Coordination Note    Johnny Evans, DOB: September 09, 1976  Phone: [phone] (home)     All above HIPAA information was verified with patient.     Specialty Medication(s):   Transplant: Prograf 1mg      Current Outpatient Medications   Medication Sig Dispense Refill   ??? albuterol 2.5 mg /3 mL (0.083 %) nebulizer solution Inhale 2.5 mg.     ??? aspirin (ECOTRIN) 81 MG tablet Take 81 mg by mouth daily.     ??? azaTHIOprine (IMURAN) 50 mg tablet Take 3 tablets (150 mg total) by mouth daily. (Patient taking differently: Take 150 mg by mouth daily. 02/23/2019: patient states has not taken for several years) 270 tablet 3   ??? cholecalciferol, vitamin D3, (VITAMIN D3) 1,000 unit capsule Take 1,000 Units by mouth daily.     ??? magnesium oxide (MAG-OX) 400 mg tablet Take 1 tablet (400 mg total) by mouth daily. 30 tablet 0   ??? metoprolol succinate (TOPROL-XL) 50 MG 24 hr tablet Take 1 tablet (50 mg total) by mouth daily. 30 tablet 11   ??? OMEGA-3/DHA/EPA/FISH OIL (FISH OIL-OMEGA-3 FATTY ACIDS) 300-1,000 mg capsule Take 2 capsules (2 g total) by mouth daily. 90 capsule 0   ??? predniSONE (DELTASONE) 5 MG tablet Take 1 tablet (5 mg total) by mouth daily. 90 tablet 3   ??? PROGRAF 1 mg capsule Take 2 capsules (2 mg total) by mouth two (2) times a day. 120 capsule 11   ??? sertraline (ZOLOFT) 100 MG tablet Take 1 tablet (100 mg total) by mouth daily. 90 tablet 3     No current facility-administered medications for this visit.         Changes to medications: [name] reports no changes at this time.    Allergies   Allergen Reactions   ??? Ace Inhibitors      GI upset       Changes to allergies: No    SPECIALTY MEDICATION ADHERENCE     Prograf 1mg   : 10 days of medicine on hand     Medication Adherence    Patient reported X missed doses in the last month:  0  Specialty Medication:  prograf 1mg   Adherence tools used:  patient uses a pill box to manage medications          Specialty medication(s) dose(s) confirmed: Regimen is correct and unchanged.     Are there any concerns with adherence? No    Adherence counseling provided? Not needed    CLINICAL MANAGEMENT AND INTERVENTION      Clinical Benefit Assessment:    Do you feel the medicine is effective or helping your condition? Yes    Clinical Benefit counseling provided? Not needed    Adverse Effects Assessment:    Are you experiencing any side effects? No    Are you experiencing difficulty administering your medicine? No    Quality of Life Assessment:    How many days over the past month did your transplant  keep you from your normal activities? For example, brushing your teeth or getting up in the morning. 0    Have you discussed this with your provider? Not needed    Therapy Appropriateness:    Is therapy appropriate? Yes, therapy is appropriate and should be continued    DISEASE/MEDICATION-SPECIFIC INFORMATION      N/A    PATIENT SPECIFIC NEEDS     ? Does the patient have any physical, cognitive,  or cultural barriers? No    ? Is the patient high risk? No     ? Does the patient require a Care Management Plan? No     ? Does the patient require physician intervention or other additional services (i.e. nutrition, smoking cessation, social work)? No      SHIPPING     Specialty Medication(s) to be Shipped:   Transplant: Prograf 1mg     Other medication(s) to be shipped: none - pt verified all other meds come from outside pharmacy     Changes to insurance: No    Delivery Scheduled: Yes, Expected medication delivery date: 03/01/2019.     Medication will be delivered via UPS to the confirmed home address in [facility].    The patient will receive a drug information handout for each medication shipped and additional FDA Medication Guides as required.  Verified that patient has previously received a Conservation officer, historic buildings.    All of the patient's questions and concerns have been addressed.    Thad Ranger   [facility] Specialty Pharmacist

## 2019-02-28 MED FILL — PROGRAF 1 MG CAPSULE: 30 days supply | Qty: 120 | Fill #8 | Status: AC

## 2019-02-28 MED FILL — PROGRAF 1 MG CAPSULE: ORAL | 30 days supply | Qty: 120 | Fill #8

## 2019-03-06 MED ORDER — TACROLIMUS 1 MG CAPSULE
ORAL_CAPSULE | Freq: Two times a day (BID) | ORAL | 11 refills | 30 days | Status: CP
Start: 2019-03-06 — End: 2020-03-05
  Filled 2019-03-29: qty 120, 30d supply, fill #0

## 2019-03-26 NOTE — Unmapped (Signed)
[facility] Clinical Assessment & Refill Coordination Note    Johnny Evans, DOB: 03/18/1976  Phone: [phone] (home)     All above HIPAA information was verified with patient.     Specialty Medication(s):   Transplant: Prograf 1mg      Current Outpatient Medications   Medication Sig Dispense Refill   ??? albuterol 2.5 mg /3 mL (0.083 %) nebulizer solution Inhale 2.5 mg.     ??? aspirin (ECOTRIN) 81 MG tablet Take 81 mg by mouth daily.     ??? azaTHIOprine (IMURAN) 50 mg tablet Take 3 tablets (150 mg total) by mouth daily. (Patient taking differently: Take 150 mg by mouth daily. 02/23/2019: patient states has not taken for several years) 270 tablet 3   ??? cholecalciferol, vitamin D3, (VITAMIN D3) 1,000 unit capsule Take 1,000 Units by mouth daily.     ??? magnesium oxide (MAG-OX) 400 mg tablet Take 1 tablet (400 mg total) by mouth daily. 30 tablet 0   ??? metoprolol succinate (TOPROL-XL) 50 MG 24 hr tablet Take 1 tablet (50 mg total) by mouth daily. 30 tablet 11   ??? OMEGA-3/DHA/EPA/FISH OIL (FISH OIL-OMEGA-3 FATTY ACIDS) 300-1,000 mg capsule Take 2 capsules (2 g total) by mouth daily. 90 capsule 0   ??? predniSONE (DELTASONE) 5 MG tablet Take 1 tablet (5 mg total) by mouth daily. 90 tablet 3   ??? sertraline (ZOLOFT) 100 MG tablet Take 1 tablet (100 mg total) by mouth daily. 90 tablet 3   ??? tacrolimus (PROGRAF) 1 MG capsule Take 2 capsules (2 mg total) by mouth two (2) times a day. 120 capsule 11     No current facility-administered medications for this visit.         Changes to medications: Johnny Evans reports no changes at this time.    Allergies   Allergen Reactions   ??? Ace Inhibitors      GI upset       Changes to allergies: No    SPECIALTY MEDICATION ADHERENCE     Prograf 1 mg: 4 days of medicine on hand       Medication Adherence    Patient reported X missed doses in the last month:  0  Specialty Medication:  Prograf 1mg   Patient is on additional specialty medications:  No  Adherence tools used: patient uses a pill box to manage medications          Specialty medication(s) dose(s) confirmed: Regimen is correct and unchanged.     Are there any concerns with adherence? No    Adherence counseling provided? Not needed    CLINICAL MANAGEMENT AND INTERVENTION      Clinical Benefit Assessment:    Do you feel the medicine is effective or helping your condition? Yes    Clinical Benefit counseling provided? Not needed    Adverse Effects Assessment:    Are you experiencing any side effects? No    Are you experiencing difficulty administering your medicine? No    Quality of Life Assessment:    How many days over the past month did your kidney transplant  keep you from your normal activities? For example, brushing your teeth or getting up in the morning. 0    Have you discussed this with your provider? Not needed    Therapy Appropriateness:    Is therapy appropriate? Yes, therapy is appropriate and should be continued    DISEASE/MEDICATION-SPECIFIC INFORMATION      N/A    PATIENT SPECIFIC NEEDS     ?  Does the patient have any physical, cognitive, or cultural barriers? No    ? Is the patient high risk? No     ? Does the patient require a Care Management Plan? No     ? Does the patient require physician intervention or other additional services (i.e. nutrition, smoking cessation, social work)? No      SHIPPING     Specialty Medication(s) to be Shipped:   Transplant: tacrolimus 1mg     Other medication(s) to be shipped: NONE     Changes to insurance: No    Delivery Scheduled: Yes, Expected medication delivery date: 03/30/2019.     Medication will be delivered via UPS to the confirmed home address in [facility].    The patient will receive a drug information handout for each medication shipped and additional FDA Medication Guides as required.  Verified that patient has previously received a Conservation officer, historic buildings.    All of the patient's questions and concerns have been addressed.    Tera Helper   [facility] Specialty Pharmacist

## 2019-03-29 MED FILL — TACROLIMUS 1 MG CAPSULE: 30 days supply | Qty: 120 | Fill #0 | Status: AC

## 2019-04-27 NOTE — Unmapped (Signed)
[facility] Pharmacy Refill Coordination Note  Medication: Tacrolimus 1mg     Unable to reach patient to schedule shipment for medication being filled at [facility]. Left voicemail on phone.  As this is the 3rd unsuccessful attempt to reach the patient, no additional phone call attempts will be made at this time.      Phone numbers attempted: [phone] (H)    Last scheduled delivery: 03/29/2019    Please call the [facility] at [phone] (option 4) should you have any further questions.      Thanks,  [facility] [employer] [facility] Specialty Team

## 2019-05-01 NOTE — Unmapped (Signed)
The [facility] has made a third and final attempt to reach this patient to refill the following medication:Tacrolimus.      We have left voicemails on the following phone numbers: [phone] Home.    Dates contacted: 7/30, 8/3, 8/7  Last scheduled delivery: 03/30/19    The patient may be at risk of non-compliance with this medication. The patient should call the [facility] at [phone] (option 4) to refill medication.    Tera Helper   [facility] Specialty Pharmacist

## 2019-05-03 MED FILL — TACROLIMUS 1 MG CAPSULE, IMMEDIATE-RELEASE: ORAL | 30 days supply | Qty: 120 | Fill #1

## 2019-05-03 MED FILL — TACROLIMUS 1 MG CAPSULE: 30 days supply | Qty: 120 | Fill #1 | Status: AC

## 2019-05-03 NOTE — Unmapped (Signed)
[facility] Pharm[facility] Refill Coordination Note    Specialty Medication(s) to be Shipped:   Transplant: tacrolimus 1mg     Other medication(s) to be shipped: na     Johnny Evans, DOB: 01-14-1976  Phone: [phone] (home)       All above HIPAA information was verified with patient.     Completed refill call assessment today to schedule patient's medication shipment from the [facility] [phone]).       Specialty medication(s) and dose(s) confirmed: Regimen is correct and unchanged.   Changes to medications: Johnny Evans reports no changes at this time.  Changes to insurance: No  Questions for the pharmacist: No    Confirmed patient received Welcome Packet with first shipment. The patient will receive a drug information handout for each medication shipped and additional FDA Medication Guides as required.       DISEASE/MEDICATION-SPECIFIC INFORMATION        N/A    SPECIALTY MEDICATION ADHERENCE     Medication Adherence    Patient reported X missed doses in the last month: 0  Specialty Medication: Tacrolimus 1 mg  Patient is on additional specialty medications: No  Patient is on more than two specialty medications: No  Any gaps in refill history greater than 2 weeks in the last 3 months: no  Demonstrates understanding of importance of adherence: yes  Informant: patient  Reliability of informant: reliable  Adherence tools used: patient uses a pill box to manage medications  Confirmed plan for next specialty medication refill: delivery by pharmacy  Refills needed for supportive medications: not needed            Tacrolimus 1 mg. 6 days on hand      SHIPPING     Shipping address confirmed in Epic.     Delivery Scheduled: Yes, Expected medication delivery date: 081420.     Medication will be delivered via UPS to the home address in Epic [place].    Johnny Evans   [facility] Specialty Technician

## 2019-05-15 NOTE — Unmapped (Signed)
Patient called asking for rent assist. Will forward to [facility] and social work. Reminded him he needs an appointment with nephrology

## 2019-05-23 NOTE — Unmapped (Signed)
Clinical Social Worker Telephone Note    Name:Johnny Evans  Date of Birth:06/19/1976  IRJ:[id number]    REFERRAL INFORMATION:  David Stall is post-transplant for kidney transplantation . CSW follows up to assess financial questions/planning.    IMPRESSION: Rec'd msg from TNC/[facility] that patient had reached out to her requesting assistance with rent payment.  This CSW agreed to explore w/ pt.    Called pt on 05/23/2019.  Rec'd msg that VM was not set up yet and to call again later.  Unable to leave VM.  Will inform TNC/[facility] of the above.      Lowella Petties, LCSW, CCTSW  Transplant Case Manager  [facility]

## 2019-05-29 MED FILL — TACROLIMUS 1 MG CAPSULE: 30 days supply | Qty: 120 | Fill #2 | Status: AC

## 2019-05-29 MED FILL — TACROLIMUS 1 MG CAPSULE, IMMEDIATE-RELEASE: ORAL | 30 days supply | Qty: 120 | Fill #2

## 2019-05-29 NOTE — Unmapped (Signed)
[facility] Pharmacy Refill Coordination Note    Specialty Medication(s) to be Shipped:   Transplant: tacrolimus 1mg      David Stall, DOB: 06-18-1976  Phone: [phone] (home)     All above HIPAA information was verified with patient.     Completed refill call assessment today to schedule patient's medication shipment from the [facility] [phone]).       Specialty medication(s) and dose(s) confirmed: Regimen is correct and unchanged.   Changes to medications: Olson reports no changes at this time.  Changes to insurance: No  Questions for the pharmacist: No    Confirmed patient received Welcome Packet with first shipment. The patient will receive a drug information handout for each medication shipped and additional FDA Medication Guides as required.       DISEASE/MEDICATION-SPECIFIC INFORMATION        N/A    SPECIALTY MEDICATION ADHERENCE     Medication Adherence    Patient reported X missed doses in the last month: 0  Specialty Medication: Tacrolimus 1mg    Patient is on additional specialty medications: No  Adherence tools used: patient uses a pill box to manage medications        Tacrolimus 1 mg: 3 days of medicine on hand     SHIPPING     Shipping address confirmed in Epic.     Delivery Scheduled: Yes, Expected medication delivery date: 05/30/2019.     Medication will be delivered via UPS to the home address in [place].    Eugene Zeiders P Allena Katz   [facility] Specialty Technician

## 2019-06-27 NOTE — Unmapped (Signed)
[facility] Pharm[facility] Refill Coordination Note    Specialty Medication(s) to be Shipped:   Transplant: tacrolimus 1mg     Other medication(s) to be shipped: n/a     David Stall, DOB: [birth year]  Phone: [phone] (home)       All above HIPAA information was verified with patient.     Completed refill call assessment today to schedule patient's medication shipment from the [facility] [phone]).       Specialty medication(s) and dose(s) confirmed: Regimen is correct and unchanged.   Changes to medications: Trevon reports no changes at this time.  Changes to insurance: No  Questions for the pharmacist: No    Confirmed patient received Welcome Packet with first shipment. The patient will receive a drug information handout for each medication shipped and additional FDA Medication Guides as required.       DISEASE/MEDICATION-SPECIFIC INFORMATION        N/A    SPECIALTY MEDICATION ADHERENCE     Medication Adherence    Patient reported X missed doses in the last month: 0  Specialty Medication: Tacrolimus  Patient is on additional specialty medications: No  Patient is on more than two specialty medications: No  Any gaps in refill history greater than 2 weeks in the last 3 months: no  Demonstrates understanding of importance of adherence: yes  Informant: patient  Adherence tools used: patient uses a pill box to manage medications                Tacrolimus 1mg : Patient has 7 days of medication on hand      SHIPPING     Shipping address confirmed in Epic.     Delivery Scheduled: Yes, Expected medication delivery date: 07/03/19.     Medication will be delivered via UPS to the home address in [place].    Olga Millers   [facility] Specialty Technician

## 2019-07-02 MED FILL — TACROLIMUS 1 MG CAPSULE: 30 days supply | Qty: 120 | Fill #3 | Status: AC

## 2019-07-02 MED FILL — TACROLIMUS 1 MG CAPSULE, IMMEDIATE-RELEASE: ORAL | 30 days supply | Qty: 120 | Fill #3

## 2019-07-06 DIAGNOSIS — Z79899 Other long term (current) drug therapy: Principal | ICD-10-CM

## 2019-07-06 DIAGNOSIS — Z940 Kidney transplant status: Principal | ICD-10-CM

## 2019-07-06 NOTE — Unmapped (Signed)
Called pt, no show to clinic appt today. Pt doing ok, he lost his key fob. TNC will have appts rescheduled and will also have SW reach out. Pt previously contact TNC regarding needing help with rent. Pt will be evicted at the end of the month and has no place to go. He did start a new job. Advised pt to keep an ear out for call from SW, she had reached out previously but did not have VM set up at the time. Pt verbalized understanding.

## 2019-07-07 NOTE — Unmapped (Signed)
Clinical Social Worker Telephone Note    Name:Johnny Evans  Date of Birth:05/30/1976  ZOX:[id number]    REFERRAL INFORMATION:  [name] is post-transplant for kidney transplantation . CSW follows up to assess financial questions/planning.    IMPRESSION: Rec'd msg from TNC/[facility] that pt was being evicted. Req SW follow up.    Caled pt today to discuss.  States that he was living with a roommate and sharing a 2 bedroom apartment, when his roommate unexpectedly moved out and he was no longer able to afford the rent.  Estimates that he is ~$2500-3000 behind in rent.  States that he has been served eviction papers as of 10/23.      Discussed alternative lodging options.  Pt states that he was out of work 2/2 back injury for some time, in addition to some unpaid leaves from work 2/2 COVID exposure on the job.  He was thinking of finding an extended stay hotel room situation, but is concerned about how this will affect his credit rating.  He is considering looking for and securing a new apartment before his eviction is official.    Currently, pt works FT for [employer] ($12/hr).  Agreed to send forms for consideration for assistance.  Per request, will send blank forms with no PHI via email.  Also sent via My Chart with instructions.  Will wait for return of documentation and more info regarding specifics of financial request.    Lowella Petties, LCSW, CCTSW  Transplant Case Manager  [facility]

## 2019-07-12 NOTE — Unmapped (Signed)
left message, called to schedule follow up from 10/16 No Show appt with Dr Toni Arthurs. Recall letter mailed.

## 2019-07-26 NOTE — Unmapped (Signed)
[facility] Pharmacy Refill Coordination Note    Specialty Medication(s) to be Shipped:   Transplant: tacrolimus 1mg     Other medication(s) to be shipped: N/A     Johnny Evans, DOB: 1976-08-22  Phone: [phone] (home)       All above HIPAA information was verified with patient.     Completed refill call assessment today to schedule patient's medication shipment from the [facility] [phone]).       Specialty medication(s) and dose(s) confirmed: Regimen is correct and unchanged.   Changes to medications: Johnny Evans reports no changes at this time.  Changes to insurance: No  Questions for the pharmacist: No    Confirmed patient received Welcome Packet with first shipment. The patient will receive a drug information handout for each medication shipped and additional FDA Medication Guides as required.       DISEASE/MEDICATION-SPECIFIC INFORMATION        N/A    SPECIALTY MEDICATION ADHERENCE     Medication Adherence    Patient reported X missed doses in the last month: 0  Specialty Medication: Tacrolimus 1mg   Patient is on additional specialty medications: No  Adherence tools used: patient uses a pill box to manage medications          Tacrolimus 1 mg: 7 days of medicine on hand     SHIPPING     Shipping address confirmed in Epic.     Delivery Scheduled: Yes, Expected medication delivery date: 07/31/2019.     Medication will be delivered via UPS to the prescription address in [place].    Johnny Evans [facility] Specialty Technician

## 2019-07-30 MED FILL — TACROLIMUS 1 MG CAPSULE, IMMEDIATE-RELEASE: ORAL | 30 days supply | Qty: 120 | Fill #4

## 2019-07-30 MED FILL — TACROLIMUS 1 MG CAPSULE: 30 days supply | Qty: 120 | Fill #4 | Status: AC

## 2019-08-01 ENCOUNTER — Ambulatory Visit: Admit: 2019-08-01 | Discharge: 2019-08-02 | Payer: MEDICARE

## 2019-08-01 ENCOUNTER — Ambulatory Visit: Admit: 2019-08-01 | Discharge: 2019-08-02 | Payer: MEDICARE | Attending: Nephrology | Primary: Nephrology

## 2019-08-01 ENCOUNTER — Encounter: Admit: 2019-08-01 | Discharge: 2019-08-02 | Payer: MEDICARE | Attending: Nephrology | Primary: Nephrology

## 2019-08-01 DIAGNOSIS — F329 Major depressive disorder, single episode, unspecified: Principal | ICD-10-CM

## 2019-08-01 DIAGNOSIS — E559 Vitamin D deficiency, unspecified: Principal | ICD-10-CM

## 2019-08-01 DIAGNOSIS — Z79899 Other long term (current) drug therapy: Principal | ICD-10-CM

## 2019-08-01 DIAGNOSIS — I10 Essential (primary) hypertension: Principal | ICD-10-CM

## 2019-08-01 DIAGNOSIS — Z940 Kidney transplant status: Principal | ICD-10-CM

## 2019-08-01 LAB — COMPREHENSIVE METABOLIC PANEL
ALBUMIN: 4 g/dL (ref 3.5–5.0)
ALKALINE PHOSPHATASE: 85 U/L (ref 38–126)
ALT (SGPT): 10 U/L (ref <50)
ANION GAP: 7 mmol/L (ref 7–15)
BILIRUBIN TOTAL: 0.8 mg/dL (ref 0.0–1.2)
BLOOD UREA NITROGEN: 15 mg/dL (ref 7–21)
BUN / CREAT RATIO: 16
CALCIUM: 9.5 mg/dL (ref 8.5–10.2)
CO2: 29 mmol/L (ref 22.0–30.0)
CREATININE: 0.95 mg/dL (ref 0.70–1.30)
EGFR CKD-EPI AA MALE: >90 mL/min/{1.73_m2} (ref >=60)
EGFR CKD-EPI NON-AA MALE: >90 mL/min/{1.73_m2} (ref >=60)
GLUCOSE RANDOM: 92 mg/dL (ref 70–99)
POTASSIUM: 4.2 mmol/L (ref 3.5–5.0)
PROTEIN TOTAL: 6.8 g/dL (ref 6.5–8.3)
SODIUM: 138 mmol/L (ref 135–145)

## 2019-08-01 LAB — CBC W/ AUTO DIFF
BASOPHILS ABSOLUTE COUNT: 0.1 10*9/L (ref 0.0–0.1)
BASOPHILS RELATIVE PERCENT: 0.7 %
EOSINOPHILS RELATIVE PERCENT: 6.6 %
HEMATOCRIT: 48.3 % (ref 41.0–53.0)
HEMOGLOBIN: 14.9 g/dL (ref 13.5–17.5)
LARGE UNSTAINED CELLS: 2 % (ref 0–4)
LYMPHOCYTES ABSOLUTE COUNT: 1.3 10*9/L — ABNORMAL LOW (ref 1.5–5.0)
LYMPHOCYTES RELATIVE PERCENT: 18.5 %
MEAN CORPUSCULAR HEMOGLOBIN CONC: 30.8 g/dL — ABNORMAL LOW (ref 31.0–37.0)
MEAN CORPUSCULAR HEMOGLOBIN: 29.1 pg (ref 26.0–34.0)
MEAN CORPUSCULAR VOLUME: 94.4 fL (ref 80.0–100.0)
MEAN PLATELET VOLUME: 8.8 fL (ref 7.0–10.0)
MONOCYTES ABSOLUTE COUNT: 0.6 10*9/L (ref 0.2–0.8)
NEUTROPHILS RELATIVE PERCENT: 63.5 %
PLATELET COUNT: 267 10*9/L (ref 150–440)
RED BLOOD CELL COUNT: 5.12 10*12/L (ref 4.50–5.90)
RED CELL DISTRIBUTION WIDTH: 14.7 % (ref 12.0–15.0)
WBC ADJUSTED: 7.1 10*9/L (ref 4.5–11.0)

## 2019-08-01 LAB — FASTING

## 2019-08-01 LAB — URINALYSIS
BACTERIA: NONE SEEN /HPF
BILIRUBIN UA: NEGATIVE
BLOOD UA: NEGATIVE
GLUCOSE UA: NEGATIVE
KETONES UA: NEGATIVE
LEUKOCYTE ESTERASE UA: NEGATIVE
NITRITE UA: NEGATIVE
PH UA: 7 (ref 5.0–9.0)
PROTEIN UA: NEGATIVE
RBC UA: 1 /HPF (ref <=3)
SPECIFIC GRAVITY UA: 1.017 (ref 1.003–1.030)
SQUAMOUS EPITHELIAL: <1 /HPF (ref 0–5)
UROBILINOGEN UA: 0.2

## 2019-08-01 LAB — HEMOGLOBIN A1C: Hemoglobin A1c/Hemoglobin.total:MFr:Pt:Bld:Qn:: 5.6

## 2019-08-01 LAB — EBV VIRAL LOAD RESULT: Lab: NOT DETECTED

## 2019-08-01 LAB — CREATININE, URINE: Lab: 133.7

## 2019-08-01 LAB — PARATHYROID HOMONE (PTH)
CALCIUM: 9.5 mg/dL (ref 8.5–10.2)
PARATHYROID HORMONE INTACT: 78.8 pg/mL — ABNORMAL HIGH (ref 12.0–72.0)

## 2019-08-01 LAB — PARATHYROID HORMONE INTACT: Parathyrin.intact:MCnc:Pt:Ser/Plas:Qn:: 78.8 — ABNORMAL HIGH

## 2019-08-01 LAB — LIPID PANEL
CHOLESTEROL/HDL RATIO SCREEN: 5.3 — ABNORMAL HIGH (ref <5.0)
CHOLESTEROL: 195 mg/dL (ref 100–199)
NON-HDL CHOLESTEROL: 158 mg/dL
TRIGLYCERIDES: 104 mg/dL (ref 1–149)
VLDL CHOLESTEROL CAL: 20.8 mg/dL (ref 11–50)

## 2019-08-01 LAB — PHOSPHORUS: Phosphate:MCnc:Pt:Ser/Plas:Qn:: 3.3

## 2019-08-01 LAB — POTASSIUM: Potassium:SCnc:Pt:Ser/Plas:Qn:: 4.2

## 2019-08-01 LAB — TACROLIMUS, TROUGH: Lab: 3.1 — ABNORMAL LOW

## 2019-08-01 LAB — NEUTROPHILS ABSOLUTE COUNT: Neutrophils:NCnc:Pt:Bld:Qn:Automated count: 4.5

## 2019-08-01 LAB — MAGNESIUM: Magnesium:MCnc:Pt:Ser/Plas:Qn:: 1.5 — ABNORMAL LOW

## 2019-08-01 LAB — MUCUS

## 2019-08-01 MED ORDER — PREDNISONE 5 MG TABLET
ORAL_TABLET | Freq: Every day | ORAL | 3 refills | 90.00000 days | Status: CP
Start: 2019-08-01 — End: 2020-07-31

## 2019-08-01 NOTE — Unmapped (Signed)
Addended by: Denton Meek on: 08/01/2019 09:05 AM     Modules accepted: Orders

## 2019-08-01 NOTE — Unmapped (Signed)
AOBP   Patient's blood pressure was taken on right upper arm, medium cuff.   1st reading 110/68, pulse: 71  2nd reading 106/72, pulse: 74  3rd reading 106/67, pulse: 70  Average reading 107/69, pulse: 72

## 2019-08-02 LAB — CMV QUANT: Lab: 0

## 2019-08-02 LAB — CMV DNA, QUANTITATIVE, PCR: CMV VIRAL LD: NOT DETECTED

## 2019-08-03 LAB — VITAMIN D, TOTAL (25OH): Lab: 18.9 — ABNORMAL LOW

## 2019-08-05 LAB — HLA DS POST TRANSPLANT
ANTI-DONOR DRW #2 MFI: 437 MFI
ANTI-DONOR HLA-A #1 MFI: 25 MFI
ANTI-DONOR HLA-A #2 MFI: 0 MFI
ANTI-DONOR HLA-B #1 MFI: 13 MFI
ANTI-DONOR HLA-B #2 MFI: 5 MFI
ANTI-DONOR HLA-C #1 MFI: 0 MFI
ANTI-DONOR HLA-C #2 MFI: 0 MFI
ANTI-DONOR HLA-DP AG #1 MFI: 111 MFI
ANTI-DONOR HLA-DQB #1 MFI: 102 MFI
ANTI-DONOR HLA-DR #2 MFI: 375 MFI

## 2019-08-05 LAB — HLA CLASS 1 ANTIBODY RESULT: Lab: POSITIVE

## 2019-08-05 LAB — FSAB CLASS 1 ANTIBODY SPECIFICITY

## 2019-08-05 LAB — DONOR HLA-DR ANTIGEN #2

## 2019-08-05 LAB — HLA CL2 AB RESULT: Lab: NEGATIVE

## 2019-08-05 MED ORDER — TACROLIMUS 1 MG CAPSULE
ORAL_CAPSULE | ORAL | 11 refills | 30 days | Status: CP
Start: 2019-08-05 — End: 2020-08-04
  Filled 2019-08-23: qty 150, 30d supply, fill #0

## 2019-08-05 MED ORDER — CHOLECALCIFEROL (VITAMIN D3) 25 MCG (1,000 UNIT) CAPSULE
Freq: Every day | ORAL | 0 days
Start: 2019-08-05 — End: ?

## 2019-08-05 NOTE — Unmapped (Signed)
[facility] [place] transplant nephrology clinic visit    assessment and plan:  1. s/p kidney transplant 05/23/2014. baseline creatinine 1-1.2 mg/dl. no proteinuria. no donor specific hla ab detected.   2. immunosuppression. tacrolimus incr to 3mg /2mg  am/pm re: 12hr lvl 5-8 ng/ml.  3. hypertension. blood pressure goal < 130/80 mmhg  4. vitamin d incr to 2,000u daily.  5. preventive medicine. influenza '20. pcv13 pneumococcal '14. ppsv23 pneumococcal '16. kidney ultrasound '20.    mr. Johnny Evans is a 43 year old gentleman seen in follow up post kidney transplant 05/23/2014. he feels well. no fever chills or sweats. no headache or lightheaded. no chest pain palpitations cough or shortness of breath. no lower extremity edema. appetite nl. no abdominal pain no n/v/d. no dysuria or difficulty voiding. no gout flare. mood stable. all other systems reviewed and negative x10 systems.    past medical hx:  1. 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 focal segmental glomerulosclerosis. alemtuzumab induction. baseline creatinine 1-1.2 mg/dl.  > kidney bx 12/15: +stage1 bk nephropathy. no acute/chr rejection.  2. hypertension  3. hyperlipidemia  4. echocardiogram '14: nl lv. lvef > 55%. diastolic lv dysfunction.  5. atrial fibrillation  6. gout  7. hx asthma/reactive airways disease    past surgical hx: left upper ext av fistula '10. kidney transplant '15.    all: ace inh    medications: tacrolimus 2mg  bid, prednisone 5mg  daily, metoprolol xl 25mg  daily, aspirin 81mg  daily, omega3 fish oil 2gm daily, sertraline 100mg  daily, vitamin d 1,000u daily.    soc hx: married/separated x2 sons. former smoking hx; quit '12. physical exam: t97.9 p72 bp107/69 wt99.3kg bmi 28.9. wd/wn gentleman appropriate affect and mood. nl sclera anicteric. neck supple no palpable ln. heart rrr nl s1s2. lungs clear bilateral. abd soft nt/nd. no lower ext edema. 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 msk no synovitis or tophi. skin no rash. neuro alert oriented non focal exam.    labs 08/01/19: wbc7.1 hgb14.9 hct48.3 plts267. YQ657 k4.2 cl102 bicarb29 bun15 cr0.95 glc92 ca9.5 mg1.5 phos3.3 albumin4.0 liver function panel nl. total cholesterol 195 tg 104 hdl 37 ldl 137. hgb a1c 5.6%. pth 78. 25-vitamin d 18.9. no donor specific hla ab detected. cmv pcr not detected. tacrolimus lvl 3.1 ng/ml. urine protein/cr 0.037.

## 2019-08-06 NOTE — Unmapped (Signed)
[facility] Specialty Medication Onboarding    Specialty Medication: TACROLIMUS 1MG  CAPSULES  Prior Authorization: Not Required   Financial Assistance: No - copay  <$25  Final Copay/Day Supply: $0 / 30 DAYS    Insurance Restrictions: Yes - max 1 month supply     Notes to Pharmacist: DOSE CHANGE    The triage team has completed the benefits investigation and has determined that the patient is able to fill this medication at [facility]. Please contact the patient to complete the onboarding or follow up with the prescribing physician as needed.

## 2019-08-10 NOTE — Unmapped (Signed)
Clinical Social Worker Telephone Note    Name:Johnny Evans  Date of Birth:1975/09/25  ZOX:[id number]    REFERRAL INFORMATION:  [name] is post-transplant for kidney transplantation . CSW follows up to assess financial questions/planning.    IMPRESSION: Spoke w/ pt in mid-October about potential rent assistance.  Documents sent for signature but no return since that time.    Rec'd phone call from pt today, stating that his current landlord has agreed to resign him for another lease, lower his rent, and work with him on his back rent.  Pt requesting that this CSW resend him the necessary documentation.    Agreed to do so.  My Chart Msg sent 08/10/19.      Lowella Petties, LCSW, CCTSW  Transplant Case Manager  [facility]

## 2019-08-13 NOTE — Unmapped (Signed)
Patient called to say he has a sore throat for the past 4 days.  Also reports an abscess in his mouth that he had before and went away and is back in the same place. Encouraged him to call dentist and stop by UC to have throat looked at.  Patient is at work now and will see if he can go to UC after work today    No fever, no congestion, no SOB, or any other complaints.

## 2019-08-13 NOTE — Unmapped (Signed)
Patient called needing to speak with someone about some discomfort and issues he's been having. Advised I'd page his coordinator to have them give him a call back.   **Paged on call Maralyn Sago) as his coordinator is out of town.

## 2019-08-23 NOTE — Unmapped (Signed)
[facility] Specialty Pharmacy Clinical Assessment & Refill Coordination Note    [name], DOB: 1976-04-09  Phone: [phone] (home)     All above HIPAA information was verified with patient.     Specialty Medication(s):   Transplant: tacrolimus 1mg      Current Outpatient Medications   Medication Sig Dispense Refill   ??? aspirin (ECOTRIN) 81 MG tablet Take 81 mg by mouth daily.     ??? cholecalciferol, vitamin D3, (VITAMIN D3) 25 mcg (1,000 unit) capsule Take 2 capsules (2,000 Units total) by mouth daily.     ??? OMEGA-3/DHA/EPA/FISH OIL (FISH OIL-OMEGA-3 FATTY ACIDS) 300-1,000 mg capsule Take 2 capsules (2 g total) by mouth daily. 90 capsule 0   ??? predniSONE (DELTASONE) 5 MG tablet Take 1 tablet (5 mg total) by mouth daily. 90 tablet 3   ??? sertraline (ZOLOFT) 100 MG tablet Take 1 tablet (100 mg total) by mouth daily. 90 tablet 3   ??? tacrolimus (PROGRAF) 1 MG capsule Take 3 capsules (3 mg total) by mouth daily AND 2 capsules (2 mg total) nightly. 150 capsule 11     No current facility-administered medications for this visit.         Changes to medications: Matheo reports no changes at this time.    Allergies   Allergen Reactions   ??? Ace Inhibitors      GI upset       Changes to allergies: No    SPECIALTY MEDICATION ADHERENCE     Tacrolimus 1mg   : 3 days of medicine on hand        Medication Adherence    Patient reported X missed doses in the last month: 0  Specialty Medication: tacrolimus 1mg   Adherence tools used: patient uses a pill box to manage medications          Specialty medication(s) dose(s) confirmed: Regimen is correct and unchanged.     Are there any concerns with adherence? No    Adherence counseling provided? Not needed    CLINICAL MANAGEMENT AND INTERVENTION      Clinical Benefit Assessment:    Do you feel the medicine is effective or helping your condition? Yes    Clinical Benefit counseling provided? Not needed    Adverse Effects Assessment: Are you experiencing any side effects? No    Are you experiencing difficulty administering your medicine? No    Quality of Life Assessment:    How many days over the past month did your transplant  keep you from your normal activities? For example, brushing your teeth or getting up in the morning. 0    Have you discussed this with your provider? Not needed    Therapy Appropriateness:    Is therapy appropriate? Yes, therapy is appropriate and should be continued    DISEASE/MEDICATION-SPECIFIC INFORMATION      N/A    PATIENT SPECIFIC NEEDS     ? Does the patient have any physical, cognitive, or cultural barriers? No    ? Is the patient high risk? No     ? Does the patient require a Care Management Plan? No     ? Does the patient require physician intervention or other additional services (i.e. nutrition, smoking cessation, social work)? No      SHIPPING     Specialty Medication(s) to be Shipped:   Transplant: tacrolimus 1mg     Other medication(s) to be shipped: na     Changes to insurance: No    Delivery Scheduled:  Yes, Expected medication delivery date: 08/24/2019.     Medication will be delivered via UPS to the confirmed prescription address in [facility].    The patient will receive a drug information handout for each medication shipped and additional FDA Medication Guides as required.  Verified that patient has previously received a Conservation officer, historic buildings.    All of the patient's questions and concerns have been addressed.    Thad Ranger   [facility] Specialty Pharmacist

## 2019-08-24 MED FILL — TACROLIMUS 1 MG CAPSULE: 30 days supply | Qty: 150 | Fill #0 | Status: AC

## 2019-09-11 MED FILL — TACROLIMUS 1 MG CAPSULE: 30 days supply | Qty: 150 | Fill #1 | Status: AC

## 2019-09-11 MED FILL — TACROLIMUS 1 MG CAPSULE, IMMEDIATE-RELEASE: ORAL | 30 days supply | Qty: 150 | Fill #1

## 2019-09-11 NOTE — Unmapped (Signed)
[facility] Specialty Pharmacy Refill Coordination Note    Specialty Medication(s) to be Shipped:   Transplant: tacrolimus 1mg     Other medication(s) to be shipped: none     Johnny Evans, DOB: Nov 10, 1975  Phone: [phone] (home)       All above HIPAA information was verified with patient.     Was a Nurse, learning disability used for this call? No    Completed refill call assessment today to schedule patient's medication shipment from the [facility] [phone]).       Specialty medication(s) and dose(s) confirmed: Regimen is correct and unchanged.   Changes to medications: Taishawn reports no changes at this time.  Changes to insurance: No  Questions for the pharmacist: No    Confirmed patient received Welcome Packet with first shipment. The patient will receive a drug information handout for each medication shipped and additional FDA Medication Guides as required.       DISEASE/MEDICATION-SPECIFIC INFORMATION        N/A    SPECIALTY MEDICATION ADHERENCE     Medication Adherence    Patient reported X missed doses in the last month: 0  Specialty Medication: tacrolimus 1mg   Patient is on additional specialty medications: No  Adherence tools used: patient uses a pill box to manage medications                tacrolimus 1 mg: 4 days of medicine on hand          SHIPPING     Shipping address confirmed in Epic.     Delivery Scheduled: Yes, Expected medication delivery date: 09/12/19.     Medication will be delivered via UPS to the prescription address in Epic WAM.    Unk Lightning   [facility] Specialty Technician

## 2019-09-30 DIAGNOSIS — F329 Major depressive disorder, single episode, unspecified: Principal | ICD-10-CM

## 2019-09-30 DIAGNOSIS — Z79899 Other long term (current) drug therapy: Principal | ICD-10-CM

## 2019-09-30 DIAGNOSIS — Z940 Kidney transplant status: Principal | ICD-10-CM

## 2019-09-30 DIAGNOSIS — E559 Vitamin D deficiency, unspecified: Principal | ICD-10-CM

## 2019-09-30 DIAGNOSIS — I10 Essential (primary) hypertension: Principal | ICD-10-CM

## 2019-10-02 MED ORDER — SERTRALINE 100 MG TABLET
ORAL_TABLET | 3 refills | 0 days | Status: CP
Start: 2019-10-02 — End: ?

## 2019-10-12 NOTE — Unmapped (Signed)
[facility] Specialty Pharmacy Refill Coordination Note    Specialty Medication(s) to be Shipped:   Transplant: tacrolimus 1mg     Other medication(s) to be shipped: none     Maritza Ferreras III, DOB: October 18, 1975  Phone: [phone] (home)       All above HIPAA information was verified with patient.     Was a Nurse, learning disability used for this call? No    Completed refill call assessment today to schedule patient's medication shipment from the [facility] [phone]).       Specialty medication(s) and dose(s) confirmed: Regimen is correct and unchanged.   Changes to medications: Jovi reports no changes at this time.  Changes to insurance: No  Questions for the pharmacist: No    Confirmed patient received Welcome Packet with first shipment. The patient will receive a drug information handout for each medication shipped and additional FDA Medication Guides as required.       DISEASE/MEDICATION-SPECIFIC INFORMATION        N/A    SPECIALTY MEDICATION ADHERENCE     Medication Adherence    Patient reported X missed doses in the last month: 0  Adherence tools used: patient uses a pill box to manage medications                Tacrolimus 1mg : 10 days worth of medication on hand.        SHIPPING     Shipping address confirmed in Epic.     Delivery Scheduled: Yes, Expected medication delivery date: 10/16/19.     Medication will be delivered via UPS to the prescription address in Epic WAM.    [place] A Khang Hannum   [facility] Specialty Technician

## 2019-10-15 MED FILL — TACROLIMUS 1 MG CAPSULE: 30 days supply | Qty: 150 | Fill #2 | Status: AC

## 2019-10-15 MED FILL — TACROLIMUS 1 MG CAPSULE, IMMEDIATE-RELEASE: ORAL | 30 days supply | Qty: 150 | Fill #2

## 2019-11-12 NOTE — Unmapped (Signed)
[facility] Specialty Pharmacy Refill Coordination Note    Specialty Medication(s) to be Shipped:   Transplant: tacrolimus 1mg     Other medication(s) to be shipped:       Johnny Evans, DOB: [birth year]  Phone: [phone] (home)       All above HIPAA information was verified with patient.     Was a Nurse, learning disability used for this call? No    Completed refill call assessment today to schedule patient's medication shipment from the [facility] [phone]).       Specialty medication(s) and dose(s) confirmed: Regimen is correct and unchanged.   Changes to medications: [name] reports no changes at this time.  Changes to insurance: No  Questions for the pharmacist: No    Confirmed patient received Welcome Packet with first shipment. The patient will receive a drug information handout for each medication shipped and additional FDA Medication Guides as required.       DISEASE/MEDICATION-SPECIFIC INFORMATION        N/A    SPECIALTY MEDICATION ADHERENCE     Medication Adherence    Patient reported X missed doses in the last month: 0  Specialty Medication: tacrolimus 1mg   Patient is on additional specialty medications: No  Informant: patient  Adherence tools used: patient uses a pill box to manage medications                Tacrolimus 1 mg: 7 days of medicine on hand         SHIPPING     Shipping address confirmed in Epic.     Delivery Scheduled: Yes, Expected medication delivery date: 02/24.     Medication will be delivered via UPS to the prescription address in Epic WAM.    Antonietta Barcelona   [facility] Specialty Technician

## 2019-11-13 MED FILL — TACROLIMUS 1 MG CAPSULE, IMMEDIATE-RELEASE: ORAL | 30 days supply | Qty: 150 | Fill #3

## 2019-11-13 MED FILL — TACROLIMUS 1 MG CAPSULE: 30 days supply | Qty: 150 | Fill #3 | Status: AC

## 2019-12-07 NOTE — Unmapped (Signed)
[facility] Specialty Pharmacy Refill Coordination Note    Specialty Medication(s) to be Shipped:   Transplant: tacrolimus 1mg     Other medication(s) to be shipped: NONE     Johnny Evans, DOB: 06-Mar-1976  Phone: [phone] (home)       All above HIPAA information was verified with patient.     Was a Nurse, learning disability used for this call? No    Completed refill call assessment today to schedule patient's medication shipment from the [facility] [phone]).       Specialty medication(s) and dose(s) confirmed: Regimen is correct and unchanged.   Changes to medications: Kore reports no changes at this time.  Changes to insurance: No  Questions for the pharmacist: No    Confirmed patient received Welcome Packet with first shipment. The patient will receive a drug information handout for each medication shipped and additional FDA Medication Guides as required.       DISEASE/MEDICATION-SPECIFIC INFORMATION        N/A    SPECIALTY MEDICATION ADHERENCE     Medication Adherence    Patient reported X missed doses in the last month: 0  Adherence tools used: patient uses a pill box to manage medications        Tacrolimus 1mg : 10 days worth of medication on hand.        SHIPPING     Shipping address confirmed in Epic.     Delivery Scheduled: Yes, Expected medication delivery date: 12/12/19.     Medication will be delivered via UPS to the prescription address in Epic WAM.    Johnny Evans   [facility] Specialty Technician

## 2019-12-11 MED FILL — TACROLIMUS 1 MG CAPSULE, IMMEDIATE-RELEASE: ORAL | 30 days supply | Qty: 150 | Fill #4

## 2019-12-11 MED FILL — TACROLIMUS 1 MG CAPSULE, IMMEDIATE-RELEASE: 30 days supply | Qty: 150 | Fill #4 | Status: AC

## 2019-12-31 NOTE — Unmapped (Signed)
[facility] Specialty Pharmacy Clinical Assessment & Refill Coordination Note    Johnny Evans, DOB: 06-Jun-1976  Phone: [phone] (home)     All above HIPAA information was verified with patient.     Was a Nurse, learning disability used for this call? No    Specialty Medication(s):   Transplant: tacrolimus 1mg      Current Outpatient Medications   Medication Sig Dispense Refill   ??? aspirin (ECOTRIN) 81 MG tablet Take 81 mg by mouth daily.     ??? cholecalciferol, vitamin D3, (VITAMIN D3) 25 mcg (1,000 unit) capsule Take 2 capsules (2,000 Units total) by mouth daily.     ??? OMEGA-3/DHA/EPA/FISH OIL (FISH OIL-OMEGA-3 FATTY ACIDS) 300-1,000 mg capsule Take 2 capsules (2 g total) by mouth daily. 90 capsule 0   ??? predniSONE (DELTASONE) 5 MG tablet Take 1 tablet (5 mg total) by mouth daily. 90 tablet 3   ??? sertraline (ZOLOFT) 100 MG tablet TAKE 1 TABLET BY MOUTH EVERY DAY 90 tablet 3   ??? tacrolimus (PROGRAF) 1 MG capsule Take 3 capsules (3 mg total) by mouth daily AND 2 capsules (2 mg total) nightly. 150 capsule 11     No current facility-administered medications for this visit.        Changes to medications: Johnny Evans reports no changes at this time.    Allergies   Allergen Reactions   ??? Ace Inhibitors      GI upset       Changes to allergies: No    SPECIALTY MEDICATION ADHERENCE     Tacrolimus 1mg   : 12 days of medicine on hand       Medication Adherence    Patient reported X missed doses in the last month: 0  Specialty Medication: tacrolimus 1mg   Adherence tools used: patient uses a pill box to manage medications          Specialty medication(s) dose(s) confirmed: Regimen is correct and unchanged.     Are there any concerns with adherence? No    Adherence counseling provided? Not needed    CLINICAL MANAGEMENT AND INTERVENTION      Clinical Benefit Assessment:    Do you feel the medicine is effective or helping your condition? Yes    Clinical Benefit counseling provided? Not needed    Adverse Effects Assessment:    Are you experiencing any side effects? No    Are you experiencing difficulty administering your medicine? No    Quality of Life Assessment:    How many days over the past month did your transplant  keep you from your normal activities? For example, brushing your teeth or getting up in the morning. 0    Have you discussed this with your provider? Not needed    Therapy Appropriateness:    Is therapy appropriate? Yes, therapy is appropriate and should be continued    DISEASE/MEDICATION-SPECIFIC INFORMATION      N/A    PATIENT SPECIFIC NEEDS     - Does the patient have any physical, cognitive, or cultural barriers? No    - Is the patient high risk? No     - Does the patient require a Care Management Plan? No     - Does the patient require physician intervention or other additional services (i.e. nutrition, smoking cessation, social work)? No      SHIPPING     Specialty Medication(s) to be Shipped:   Transplant: tacrolimus 1mg     Other medication(s) to be shipped: na  Changes to insurance: No    Delivery Scheduled: Yes, Expected medication delivery date: 01/08/2020.     Medication will be delivered via UPS to the confirmed prescription address in [facility].    The patient will receive a drug information handout for each medication shipped and additional FDA Medication Guides as required.  Verified that patient has previously received a Conservation officer, historic buildings.    All of the patient's questions and concerns have been addressed.    Thad Ranger   [facility] Specialty Pharmacist

## 2020-01-07 MED FILL — TACROLIMUS 1 MG CAPSULE, IMMEDIATE-RELEASE: ORAL | 30 days supply | Qty: 150 | Fill #5

## 2020-01-07 MED FILL — TACROLIMUS 1 MG CAPSULE, IMMEDIATE-RELEASE: 30 days supply | Qty: 150 | Fill #5 | Status: AC

## 2020-01-14 NOTE — Unmapped (Signed)
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 CSW follows up to assess financial questions/planning    TRANSPLANT DATE:   05/23/2014 (Kidney)    POST TXP RN COORDINATOR:   Thayer Headings [phone]; [phone]      SUMMARY:  Multiple non-PHI emails and phone calls over last week regarding pt's currently living situation.  Noted that patient had contacted this CSW several months ago regarding assistance with rent.  Financial assistance documents were sent to patient, but nothing was ever returned to this Clinical research associate.  This CSW attempted to follow up several times w/ patient and was ultimately informed by pt that he had worked something out with his landlord.    Received more recent email from patient requesting rent assistance again.  After some discussion, it would appear that he was not able to work something out previously w/ his Public librarian.  Received verbal permission for this CSW to contact his landlord.    Called leasing agency on 01/11/2020 and spoke w/ Asst Mgr/Brandy.   She acknowledged that there had been some significant turn over on their administration staff, during which she was absent for about 13 weeks.  However, Asst Mgr reports that she was there before and has been there after and is familiar w/ the situation.  States that last documented payment was 02/23/2019.  She has no documentation that pt has made any attempt to reach a payment agreement and now has an outstanding balance ~$10,000.    Asst Mgr confirmed that pt is on their eviction list, but they are on hold w/ all evictions 2/2 eviction moratorium.  Given amount of overdue rent and how long this has been going on, Asst Mgr reports that pt would have to pay up front and in full the vast majority of this total amount before they would consider taking him off the list and/or allowing him to sign a new lease.    Additional information sent to patient both via email and My Chart including:  ?? [email or web address]   [place] local assistance    ?? FundraisingSteps.no  [employer] of Eviction Program    ?? GotVisitors.hu  Info on current Legal Aid programs to provide assistance    Will continue to explore outside resources, as well as those w/in the [facility] system.  This CSW has some concerns about providing assistance given that available amounts would be so small in comparison to the overall total amount, that pt is likely to be evicted even with financial assistance.  Staffed w/ Transplant administration who verified that concern.      Will continue to work w/ patient as needed.      Lowella Petties, LCSW, CCTSW  Transplant Case Manager  [facility]  01/14/2020

## 2020-02-01 NOTE — Unmapped (Signed)
[facility] Specialty Pharmacy Refill Coordination Note    Specialty Medication(s) to be Shipped:   Transplant: tacrolimus 1mg     Other medication(s) to be shipped: N/A     Johnny Evans, DOB: September 19, 1976  Phone: [phone] (home)       All above HIPAA information was verified with patient.     Was a Nurse, learning disability used for this call? No    Completed refill call assessment today to schedule patient's medication shipment from the [facility] [phone]).       Specialty medication(s) and dose(s) confirmed: Regimen is correct and unchanged.   Changes to medications: Natalie reports no changes at this time.  Changes to insurance: No  Questions for the pharmacist: No    Confirmed patient received Welcome Packet with first shipment. The patient will receive a drug information handout for each medication shipped and additional FDA Medication Guides as required.       DISEASE/MEDICATION-SPECIFIC INFORMATION        N/A    SPECIALTY MEDICATION ADHERENCE     Medication Adherence    Patient reported X missed doses in the last month: 0  Specialty Medication: Tacrolimus 1mg   Patient is on additional specialty medications: No  Adherence tools used: patient uses a pill box to manage medications          Tacrolimus 1 mg: 14 days of medicine on hand     SHIPPING     Shipping address confirmed in Epic.     Delivery Scheduled: Yes, Expected medication delivery date: 02/08/2020.     Medication will be delivered via UPS to the prescription address in Epic WAM.    [name] [facility] Specialty Technician

## 2020-02-07 MED FILL — TACROLIMUS 1 MG CAPSULE, IMMEDIATE-RELEASE: 30 days supply | Qty: 150 | Fill #6 | Status: AC

## 2020-02-07 MED FILL — TACROLIMUS 1 MG CAPSULE, IMMEDIATE-RELEASE: ORAL | 30 days supply | Qty: 150 | Fill #6

## 2020-02-29 NOTE — Unmapped (Signed)
[facility] Specialty Pharmacy Clinical Assessment & Refill Coordination Note    Johnny Evans, DOB: 20-Jul-1976  Phone: [phone] (home)     All above HIPAA information was verified with patient.     Was a Nurse, learning disability used for this call? No    Specialty Medication(s):   Transplant: tacrolimus 1mg      Current Outpatient Medications   Medication Sig Dispense Refill   ??? aspirin (ECOTRIN) 81 MG tablet Take 81 mg by mouth daily.     ??? cholecalciferol, vitamin D3, (VITAMIN D3) 25 mcg (1,000 unit) capsule Take 2 capsules (2,000 Units total) by mouth daily.     ??? OMEGA-3/DHA/EPA/FISH OIL (FISH OIL-OMEGA-3 FATTY ACIDS) 300-1,000 mg capsule Take 2 capsules (2 g total) by mouth daily. 90 capsule 0   ??? predniSONE (DELTASONE) 5 MG tablet Take 1 tablet (5 mg total) by mouth daily. 90 tablet 3   ??? sertraline (ZOLOFT) 100 MG tablet TAKE 1 TABLET BY MOUTH EVERY DAY 90 tablet 3   ??? tacrolimus (PROGRAF) 1 MG capsule Take 3 capsules (3 mg total) by mouth daily AND 2 capsules (2 mg total) nightly. 150 capsule 11     No current facility-administered medications for this visit.        Changes to medications: [name] reports no changes at this time.    Allergies   Allergen Reactions   ??? Ace Inhibitors      GI upset       Changes to allergies: No    SPECIALTY MEDICATION ADHERENCE     Tacrolimus 1mg   : 8 days of medicine on hand      Medication Adherence    Patient reported X missed doses in the last month: 0  Specialty Medication: tacrolimus 1mg   Adherence tools used: patient uses a pill box to manage medications          Specialty medication(s) dose(s) confirmed: Regimen is correct and unchanged.     Are there any concerns with adherence? No    Adherence counseling provided? Not needed    CLINICAL MANAGEMENT AND INTERVENTION      Clinical Benefit Assessment:    Do you feel the medicine is effective or helping your condition? Yes    Clinical Benefit counseling provided? Not needed    Adverse Effects Assessment:    Are you experiencing any side effects? No    Are you experiencing difficulty administering your medicine? No    Quality of Life Assessment:    How many days over the past month did your transplant  keep you from your normal activities? For example, brushing your teeth or getting up in the morning. 0    Have you discussed this with your provider? Not needed    Therapy Appropriateness:    Is therapy appropriate? Yes, therapy is appropriate and should be continued    DISEASE/MEDICATION-SPECIFIC INFORMATION      N/A    PATIENT SPECIFIC NEEDS     - Does the patient have any physical, cognitive, or cultural barriers? No    - Is the patient high risk? No     - Does the patient require a Care Management Plan? No     - Does the patient require physician intervention or other additional services (i.e. nutrition, smoking cessation, social work)? No      SHIPPING     Specialty Medication(s) to be Shipped:   Transplant: tacrolimus 1mg     Other medication(s) to be shipped: na  Changes to insurance: No    Delivery Scheduled: Yes, Expected medication delivery date: 03/05/2020.     Medication will be delivered via UPS to the confirmed prescription address in [facility].    The patient will receive a drug information handout for each medication shipped and additional FDA Medication Guides as required.  Verified that patient has previously received a Conservation officer, historic buildings.    All of the patient's questions and concerns have been addressed.    Thad Ranger   [facility] Specialty Pharmacist

## 2020-03-04 DIAGNOSIS — F329 Major depressive disorder, single episode, unspecified: Principal | ICD-10-CM

## 2020-03-04 DIAGNOSIS — E559 Vitamin D deficiency, unspecified: Principal | ICD-10-CM

## 2020-03-04 DIAGNOSIS — Z79899 Other long term (current) drug therapy: Principal | ICD-10-CM

## 2020-03-04 DIAGNOSIS — Z940 Kidney transplant status: Principal | ICD-10-CM

## 2020-03-04 DIAGNOSIS — I10 Essential (primary) hypertension: Principal | ICD-10-CM

## 2020-03-04 MED ORDER — SERTRALINE 100 MG TABLET
ORAL_TABLET | 3 refills | 0 days
Start: 2020-03-04 — End: ?

## 2020-03-04 MED FILL — TACROLIMUS 1 MG CAPSULE, IMMEDIATE-RELEASE: ORAL | 30 days supply | Qty: 150 | Fill #7

## 2020-03-04 MED FILL — TACROLIMUS 1 MG CAPSULE, IMMEDIATE-RELEASE: 30 days supply | Qty: 150 | Fill #7 | Status: AC

## 2020-03-27 NOTE — Unmapped (Signed)
[facility] Specialty Pharmacy Refill Coordination Note    Specialty Medication(s) to be Shipped:   Transplant: tacrolimus 1mg     Other medication(s) to be shipped: N/A     Johnny Evans, DOB: [birth year]  Phone: [phone] (home)       All above HIPAA information was verified with patient.     Was a Nurse, learning disability used for this call? No    Completed refill call assessment today to schedule patient's medication shipment from the [facility] [phone]).       Specialty medication(s) and dose(s) confirmed: Regimen is correct and unchanged.   Changes to medications: Kent reports no changes at this time.  Changes to insurance: No  Questions for the pharmacist: No    Confirmed patient received Welcome Packet with first shipment. The patient will receive a drug information handout for each medication shipped and additional FDA Medication Guides as required.       DISEASE/MEDICATION-SPECIFIC INFORMATION        N/A    SPECIALTY MEDICATION ADHERENCE     Medication Adherence    Patient reported X missed doses in the last month: 0  Specialty Medication: Tacrolimus 1mg   Patient is on additional specialty medications: No  Adherence tools used: patient uses a pill box to manage medications        Tacrolimus 1 mg: 10 days of medicine on hand     SHIPPING     Shipping address confirmed in Epic.     Delivery Scheduled: Yes, Expected medication delivery date: 04/03/2020.     Medication will be delivered via UPS to the prescription address in Epic WAM.    [name] [facility] Specialty Technician

## 2020-04-02 MED FILL — TACROLIMUS 1 MG CAPSULE, IMMEDIATE-RELEASE: 30 days supply | Qty: 150 | Fill #8 | Status: AC

## 2020-04-02 MED FILL — TACROLIMUS 1 MG CAPSULE, IMMEDIATE-RELEASE: ORAL | 30 days supply | Qty: 150 | Fill #8

## 2020-05-14 NOTE — Unmapped (Signed)
[facility] Specialty Pharmacy Refill Coordination Note    Specialty Medication(s) to be Shipped:   Transplant: tacrolimus 1mg     Other medication(s) to be shipped: No additional medications requested for fill at this time     Johnny Evans, DOB: [birth year]  Phone: [phone] (home)       All above HIPAA information was verified with patient.     Was a Nurse, learning disability used for this call? No    Completed refill call assessment today to schedule patient's medication shipment from the [facility] [phone]).       Specialty medication(s) and dose(s) confirmed: Regimen is correct and unchanged.   Changes to medications: Que reports no changes at this time.  Changes to insurance: No  Questions for the pharmacist: No    Confirmed patient received Welcome Packet with first shipment. The patient will receive a drug information handout for each medication shipped and additional FDA Medication Guides as required.       DISEASE/MEDICATION-SPECIFIC INFORMATION        N/A    SPECIALTY MEDICATION ADHERENCE     Medication Adherence    Patient reported X missed doses in the last month: 0  Specialty Medication: Tacrolimus 1mg   Patient is on additional specialty medications: No  Adherence tools used: patient uses a pill box to manage medications        Tacrolimus 1 mg: 12 days of medicine on hand     SHIPPING     Shipping address confirmed in Epic.     Delivery Scheduled: Yes, Expected medication delivery date: 05/23/2020.     Medication will be delivered via UPS to the prescription address in Epic WAM.    [name] [facility] Pharm[facility] Specialty Technician

## 2020-05-22 MED FILL — TACROLIMUS 1 MG CAPSULE, IMMEDIATE-RELEASE: 30 days supply | Qty: 150 | Fill #9 | Status: AC

## 2020-05-22 MED FILL — TACROLIMUS 1 MG CAPSULE, IMMEDIATE-RELEASE: ORAL | 30 days supply | Qty: 150 | Fill #9

## 2020-06-16 NOTE — Unmapped (Signed)
[facility] Specialty Pharmacy Refill Coordination Note    Specialty Medication(s) to be Shipped:   Transplant: tacrolimus 1mg     Other medication(s) to be shipped: No additional medications requested for fill at this time     [name], DOB: [birth year]  Phone: [phone] (home)       All above HIPAA information was verified with patient.     Was a Nurse, learning disability used for this call? No    Completed refill call assessment today to schedule patient's medication shipment from the [facility] [phone]).       Specialty medication(s) and dose(s) confirmed: Regimen is correct and unchanged.   Changes to medications: Song reports no changes at this time.  Changes to insurance: No  Questions for the pharmacist: No    Confirmed patient received Welcome Packet with first shipment. The patient will receive a drug information handout for each medication shipped and additional FDA Medication Guides as required.       DISEASE/MEDICATION-SPECIFIC INFORMATION        N/A    SPECIALTY MEDICATION ADHERENCE     Medication Adherence    Patient reported X missed doses in the last month: 0  Specialty Medication: Tacrolimus 1mg   Patient is on additional specialty medications: No  Adherence tools used: patient uses a pill box to manage medications        Tacrolimus 1 mg: 7 days of medicine on hand     SHIPPING     Shipping address confirmed in Epic.     Delivery Scheduled: Yes, Expected medication delivery date: 06/20/2020.     Medication will be delivered via UPS to the prescription address in Epic WAM.    [name] [facility] Specialty Technician

## 2020-06-19 MED FILL — TACROLIMUS 1 MG CAPSULE, IMMEDIATE-RELEASE: 30 days supply | Qty: 150 | Fill #10 | Status: AC

## 2020-06-19 MED FILL — TACROLIMUS 1 MG CAPSULE, IMMEDIATE-RELEASE: ORAL | 30 days supply | Qty: 150 | Fill #10

## 2020-07-15 NOTE — Unmapped (Signed)
KIDNEY POST-TRANSPLANT ASSESSMENT   Clinical Social Worker Telephone Note    Name:Johnny Evans  Date of Birth:[birth year]  ZOX:[id number]    REFERRAL INFORMATION:    [name] is s/p transplant for kidney transplantation . CSW follows up to assess financial questions/planning and insurance questions/planning    TRANSPLANT DATE:   05/23/2014 (Kidney)    POST TXP RN COORDINATOR:   Philis Nettle [phone]; fax/([phone]    SUMMARY:   Rec'd phone call from pt this AM requesting financial assistance.  Noted that pt has called this CSW multiple times in the past for financial assistance, but has never followed thru w/ paperwork.    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  He does feel that his work has been more consistent recently.  He no longer receives SSDI, which was a hit and like many, has had a variety of impacts from COVID/loss of work on his income.    Today, he states that he has finally gotten his living situation straight and feels comfortable there, but has gotten 3-4 months behind in his car payment.  Moving forward, he feels that he will be able to make consistent payments if he can get caught up.    Also noted that pt is currently uninsured.    ?? [employer] program and agreed to have application sent to his home.  Address verified in EPIC.  Secure msg sent to [facility] w/ request.  Reiterated to pt that he needs to [facility] for this application, FILL IT OUT asap, and RETURN it asap.  Pt indicated that he understood  ?? Explained [place] PAP program.  Pt states that he is not on many meds now and has been paying out of pocket.  Secure chat sent to [facility] to see if pt is already enrolled in PAP.  If not, pt will need to call to enroll asap.  ?? Explained potential financial assistance.  Pt agreed to send proof of income and statement to this CSW for review of eligibility.      Lowella Petties, LCSW, CCTSW  Transplant Case Manager  [facility] for Transplant Care  07/15/2020

## 2020-07-18 MED FILL — TACROLIMUS 1 MG CAPSULE, IMMEDIATE-RELEASE: 30 days supply | Qty: 150 | Fill #11 | Status: AC

## 2020-07-18 MED FILL — TACROLIMUS 1 MG CAPSULE, IMMEDIATE-RELEASE: ORAL | 30 days supply | Qty: 150 | Fill #11

## 2020-07-18 NOTE — Unmapped (Signed)
[facility] Specialty Pharmacy Refill Coordination Note    Specialty Medication(s) to be Shipped:   Transplant: tacrolimus 1mg     Other medication(s) to be shipped: No additional medications requested for fill at this time     Johnny Evans, DOB: Mar 20, 1976  Phone: [phone] (home)       All above HIPAA information was verified with patient.     Was a Nurse, learning disability used for this call? No    Completed refill call assessment today to schedule patient's medication shipment from the [facility] [phone]).       Specialty medication(s) and dose(s) confirmed: Regimen is correct and unchanged.   Changes to medications: Hensley reports no changes at this time.  Changes to insurance: No  Questions for the pharmacist: No    Confirmed patient received Welcome Packet with first shipment. The patient will receive a drug information handout for each medication shipped and additional FDA Medication Guides as required.       DISEASE/MEDICATION-SPECIFIC INFORMATION        N/A    SPECIALTY MEDICATION ADHERENCE     Medication Adherence    Patient reported X missed doses in the last month: 0  Adherence tools used: patient uses a pill box to manage medications          Tacrolimus 1mg : 8 days worth of medication on hand.        SHIPPING     Shipping address confirmed in Epic.     Delivery Scheduled: Yes, Expected medication delivery date: 07/21/20.     Medication will be delivered via UPS to the prescription address in Epic WAM.    [place] A Saffron Busey   [facility] Shared [facility] Pharm[facility] Specialty Technician

## 2020-07-18 NOTE — Unmapped (Signed)
KIDNEY POST-TRANSPLANT ASSESSMENT   Clinical Social Worker Progress Note    Name:Johnny Evans  Date of Birth:1975-10-07  ZOX:[id number]    REFERRAL INFORMATION:    Johnny Evans is s/p transplant for kidney transplantation . CSW follows up to assess insurance questions/planning    TRANSPLANT DATE:   05/23/2014 (Kidney)    POST TXP RN COORDINATOR:   Johnny Evans [phone]; [phone]      SUMMARY:   Sent information about applying for [place] PAP and [place] Financial Assistance to pt's My Chart and mailed to home.       Johnny Petties, LCSW, CCTSW  Transplant Case Manager  [facility] for Transplant Care  07/18/2020

## 2020-08-01 NOTE — Unmapped (Signed)
error 

## 2020-08-05 NOTE — Unmapped (Signed)
KIDNEY POST-TRANSPLANT ASSESSMENT   Clinical Social Worker Progress Note    Name:Johnny Evans  Date of Birth:April 02, 1976  ZOX:[id number]    REFERRAL INFORMATION:    Johnny Evans is s/p transplant for kidney transplantation . CSW follows up to assess financial questions/planning    TRANSPLANT DATE:   05/23/2014 (Kidney)    POST TXP RN COORDINATOR:   Thayer Headings [phone]; fax/([phone]      SUMMARY:   Received multiple emails from pt today regarding financial assistance for $772 overdue car payment.  Per pt, he is at risk of repossession by 08/08/2020.  Gathered information and sent financial assistance request to COVID fund.  Requested urgent attention.      Lowella Petties, LCSW, CCTSW  Transplant Case Manager  [facility] for Transplant Care  08/04/2020

## 2020-08-07 NOTE — Unmapped (Signed)
KIDNEY POST-TRANSPLANT ASSESSMENT   Clinical Social Worker Telephone Note    Name:Johnny Evans  Date of Birth:1976-05-25  DGU:[id number]    REFERRAL INFORMATION:    Johnny Evans is s/p transplant for kidney transplantation . CSW follows up to assess financial questions/planning    TRANSPLANT DATE:   05/23/2014 (Kidney)    POST TXP RN COORDINATOR:   Thayer Headings [phone]; [phone]      SUMMARY:  Rec'd confirmation that pt request for [place] financial assistance ($772/back car payment) was approved.  Documentation has been sent to [facility] for payment.    Communicated w/ pt that he had been approved.    Communicated w/ [employer] that pt had also been approved.  She will note record to avoid repossession on Friday.      Lowella Petties, LCSW, CCTSW  Transplant Case Manager  [facility] for Transplant Care  08/06/2020

## 2020-08-08 DIAGNOSIS — Z940 Kidney transplant status: Principal | ICD-10-CM

## 2020-08-08 MED ORDER — TACROLIMUS 1 MG CAPSULE, IMMEDIATE-RELEASE
ORAL_CAPSULE | ORAL | 11 refills | 30 days | Status: CP
Start: 2020-08-08 — End: 2021-08-08
  Filled 2020-08-18: qty 150, 30d supply, fill #0

## 2020-08-08 NOTE — Unmapped (Signed)
[facility] Specialty Pharmacy Clinical Assessment & Refill Coordination Note    Johnny Evans, DOB: 20-Apr-1976   Phone: [phone] (home)     All above HIPAA information was verified with patient.     Was a Nurse, learning disability used for this call? No    Specialty Medication(s):   Transplant: tacrolimus 1mg      Current Outpatient Medications   Medication Sig Dispense Refill   ??? aspirin (ECOTRIN) 81 MG tablet Take 81 mg by mouth daily.     ??? cholecalciferol, vitamin D3, (VITAMIN D3) 25 mcg (1,000 unit) capsule Take 2 capsules (2,000 Units total) by mouth daily.     ??? OMEGA-3/DHA/EPA/FISH OIL (FISH OIL-OMEGA-3 FATTY ACIDS) 300-1,000 mg capsule Take 2 capsules (2 g total) by mouth daily. 90 capsule 0   ??? predniSONE (DELTASONE) 5 MG tablet Take 1 tablet (5 mg total) by mouth daily. 90 tablet 3   ??? sertraline (ZOLOFT) 100 MG tablet TAKE 1 TABLET BY MOUTH EVERY DAY 90 tablet 3   ??? tacrolimus (PROGRAF) 1 MG capsule Take 3 capsules (3 mg total) by mouth daily AND 2 capsules (2 mg total) nightly. 150 capsule 11     No current facility-administered medications for this visit.        Changes to medications: Johnny Evans reports no changes at this time.    Allergies   Allergen Reactions   ??? Ace Inhibitors      GI upset       Changes to allergies: No    SPECIALTY MEDICATION ADHERENCE     Tacrolimus 1 mg: 13 days of medicine on hand       Medication Adherence    Patient reported X missed doses in the last month: 0  Specialty Medication: Tacrolimus 1mg   Patient is on additional specialty medications: No  Adherence tools used: patient uses a pill box to manage medications          Specialty medication(s) dose(s) confirmed: Regimen is correct and unchanged.     Are there any concerns with adherence? No    Adherence counseling provided? Not needed    CLINICAL MANAGEMENT AND INTERVENTION      Clinical Benefit Assessment:    Do you feel the medicine is effective or helping your condition? Yes    Clinical Benefit counseling provided? Not needed    Adverse Effects Assessment:    Are you experiencing any side effects? No    Are you experiencing difficulty administering your medicine? No    Quality of Life Assessment:    How many days over the past month did your kidney transplant  keep you from your normal activities? For example, brushing your teeth or getting up in the morning. 0    Have you discussed this with your provider? Not needed    Therapy Appropriateness:    Is therapy appropriate? Yes, therapy is appropriate and should be continued    DISEASE/MEDICATION-SPECIFIC INFORMATION      N/A    PATIENT SPECIFIC NEEDS     - Does the patient have any physical, cognitive, or cultural barriers? No    - Is the patient high risk? No    - Does the patient require a Care Management Plan? No     - Does the patient require physician intervention or other additional services (i.e. nutrition, smoking cessation, social work)? No      SHIPPING     Specialty Medication(s) to be Shipped:   Transplant: tacrolimus 1mg     Other  medication(s) to be shipped: No additional medications requested for fill at this time     Changes to insurance: No    Delivery Scheduled: Yes, Expected medication delivery date: 08/19/20.     Medication will be delivered via UPS to the confirmed prescription address in [facility].    The patient will receive a drug information handout for each medication shipped and additional FDA Medication Guides as required.  Verified that patient has previously received a Conservation officer, historic buildings.    All of the patient's questions and concerns have been addressed.    Tera Helper   [facility] Specialty Pharmacist

## 2020-08-18 MED FILL — TACROLIMUS 1 MG CAPSULE, IMMEDIATE-RELEASE: 30 days supply | Qty: 150 | Fill #0 | Status: AC

## 2020-08-21 DIAGNOSIS — Z940 Kidney transplant status: Principal | ICD-10-CM

## 2020-08-21 DIAGNOSIS — I10 Essential (primary) hypertension: Principal | ICD-10-CM

## 2020-08-21 DIAGNOSIS — E559 Vitamin D deficiency, unspecified: Principal | ICD-10-CM

## 2020-08-21 DIAGNOSIS — D849 Immunodeficiency, unspecified: Principal | ICD-10-CM

## 2020-08-21 MED ORDER — PREDNISONE 5 MG TABLET
ORAL_TABLET | 3 refills | 0 days
Start: 2020-08-21 — End: ?

## 2020-08-25 MED ORDER — PREDNISONE 5 MG TABLET
ORAL_TABLET | 3 refills | 0 days | Status: CP
Start: 2020-08-25 — End: ?

## 2020-09-01 DIAGNOSIS — Z940 Kidney transplant status: Principal | ICD-10-CM

## 2020-09-01 DIAGNOSIS — Z79899 Other long term (current) drug therapy: Principal | ICD-10-CM

## 2020-09-02 NOTE — Unmapped (Signed)
Lm for patient to return my call.

## 2020-09-05 NOTE — Unmapped (Signed)
[facility] Specialty Pharmacy Refill Coordination Note    Specialty Medication(s) to be Shipped:   Transplant: tacrolimus 1mg     Other medication(s) to be shipped: No additional medications requested for fill at this time     [name], DOB: 12/31/1975  Phone: [phone] (home)       All above HIPAA information was verified with patient.     Was a Nurse, learning disability used for this call? No    Completed refill call assessment today to schedule patient's medication shipment from the [facility] [phone]).       Specialty medication(s) and dose(s) confirmed: Regimen is correct and unchanged.   Changes to medications: Eaven reports no changes at this time.  Changes to insurance: No  Questions for the pharmacist: No    Confirmed patient received Welcome Packet with first shipment. The patient will receive a drug information handout for each medication shipped and additional FDA Medication Guides as required.       DISEASE/MEDICATION-SPECIFIC INFORMATION        N/A    SPECIALTY MEDICATION ADHERENCE     Medication Adherence    Patient reported X missed doses in the last month: 0  Specialty Medication: Tacrolimus  Patient is on additional specialty medications: No  Patient is on more than two specialty medications: No  Any gaps in refill history greater than 2 weeks in the last 3 months: no  Demonstrates understanding of importance of adherence: yes  Informant: patient  Adherence tools used: patient uses a pill box to manage medications                Tacrolimus 1mg : Patient has 14 days of medication on hand       SHIPPING     Shipping address confirmed in Epic.     Delivery Scheduled: Yes, Expected medication delivery date: 12/29.     Medication will be delivered via UPS to the prescription address in Epic WAM.    Olga Millers   [facility] Specialty Technician

## 2020-09-10 NOTE — Unmapped (Signed)
Patient called back. Nothing available 1/7 at any locations and attempted to get him schedule for a different day and states he will call back.    Thanks,    Hospital doctor

## 2020-09-16 MED FILL — TACROLIMUS 1 MG CAPSULE, IMMEDIATE-RELEASE: ORAL | 30 days supply | Qty: 150 | Fill #1

## 2020-09-16 MED FILL — TACROLIMUS 1 MG CAPSULE, IMMEDIATE-RELEASE: 30 days supply | Qty: 150 | Fill #1 | Status: AC

## 2020-09-25 DIAGNOSIS — Z940 Kidney transplant status: Principal | ICD-10-CM

## 2020-09-25 DIAGNOSIS — Z79899 Other long term (current) drug therapy: Principal | ICD-10-CM

## 2020-10-02 NOTE — Unmapped (Signed)
[facility] - Covid-19 immunization record sent to HIM Suan Halter   October 02, 2020 3:20 PM

## 2020-10-09 NOTE — Unmapped (Signed)
[facility] Specialty Pharmacy Refill Coordination Note    Specialty Medication(s) to be Shipped:   Transplant: tacrolimus 1mg     Other medication(s) to be shipped: No additional medications requested for fill at this time     [name], DOB: Aug 21, 1976  Phone: [phone] (home)       All above HIPAA information was verified with patient.     Was a Nurse, learning disability used for this call? No    Completed refill call assessment today to schedule patient's medication shipment from the [facility] [phone]).       Specialty medication(s) and dose(s) confirmed: Regimen is correct and unchanged.   Changes to medications: Freman reports no changes at this time.  Changes to insurance: No  Questions for the pharmacist: No    Confirmed patient received Welcome Packet with first shipment. The patient will receive a drug information handout for each medication shipped and additional FDA Medication Guides as required.       DISEASE/MEDICATION-SPECIFIC INFORMATION        N/A    SPECIALTY MEDICATION ADHERENCE     Medication Adherence    Patient reported X missed doses in the last month: 0  Adherence tools used: patient uses a pill box to manage medications          Tacrolimus 1mg : 10 days worth of medication on hand.        SHIPPING     Shipping address confirmed in Epic.     Delivery Scheduled: Yes, Expected medication delivery date: 10/15/20.     Medication will be delivered via UPS to the prescription address in Epic WAM.    [place] A Aashish Hamm   [facility] Specialty Technician

## 2020-10-14 MED FILL — TACROLIMUS 1 MG CAPSULE, IMMEDIATE-RELEASE: ORAL | 30 days supply | Qty: 150 | Fill #2

## 2020-10-24 NOTE — Unmapped (Signed)
[facility] - Covid-19 immunization record sent to HIM Suan Halter   October 24, 2020 2:39 PM

## 2020-11-11 NOTE — Unmapped (Addendum)
[facility] Refill Coordination Note    Specialty Medication(s) to be Shipped:   Transplant: tacrolimus 1mg     Other medication(s) to be shipped: No additional medications requested for fill at this time     [name], DOB: December 29, 1975  Phone: [phone] (home)       All above HIPAA information was verified with patient.     Was a Nurse, learning disability used for this call? No    Completed refill call assessment today to schedule patient's medication shipment from the [facility] [phone]).       Specialty medication(s) and dose(s) confirmed: Regimen is correct and unchanged.   Changes to medications: Bazil reports no changes at this time.  Changes to insurance: No  Questions for the pharmacist: No    Confirmed patient received Welcome Packet with first shipment. The patient will receive a drug information handout for each medication shipped and additional FDA Medication Guides as required.       DISEASE/MEDICATION-SPECIFIC INFORMATION        N/A    SPECIALTY MEDICATION ADHERENCE     Medication Adherence    Patient reported X missed doses in the last month: 0  Specialty Medication: Tacrolimus 1mg   Patient is on additional specialty medications: No  Adherence tools used: patient uses a pill box to manage medications        Tacrolimus 1 mg: 12 days of medicine on hand     SHIPPING     Shipping address confirmed in Epic.     Delivery Scheduled: Yes, Expected medication delivery date: 11/18/2020.     Medication will be delivered via UPS to the temporary address in Epic WAM.    Oretha Milch   [facility] Specialty Technician

## 2020-11-17 MED FILL — TACROLIMUS 1 MG CAPSULE, IMMEDIATE-RELEASE: ORAL | 30 days supply | Qty: 150 | Fill #3

## 2020-12-01 DIAGNOSIS — Z79899 Other long term (current) drug therapy: Principal | ICD-10-CM

## 2020-12-01 DIAGNOSIS — Z940 Kidney transplant status: Principal | ICD-10-CM

## 2020-12-01 DIAGNOSIS — E559 Vitamin D deficiency, unspecified: Principal | ICD-10-CM

## 2020-12-01 DIAGNOSIS — I10 Essential (primary) hypertension: Principal | ICD-10-CM

## 2020-12-01 DIAGNOSIS — F32A Depression, unspecified depression type: Principal | ICD-10-CM

## 2020-12-01 MED ORDER — SERTRALINE 100 MG TABLET
ORAL_TABLET | 6 refills | 0.00000 days | Status: CP
Start: 2020-12-01 — End: ?

## 2020-12-17 NOTE — Unmapped (Signed)
[facility] Refill Coordination Note     Specialty Medication(s) to be Shipped:   Transplant: tacrolimus 1mg     Other medication(s) to be shipped: No additional medications requested for fill at this time     Johnny Evans, DOB: Jan 07, 1976  Phone: [phone] (home)       All above HIPAA information was verified with patient.     Was a Nurse, learning disability used for this call? No    Completed refill call assessment today to schedule patient's medication shipment from the [facility] [phone]).       Specialty medication(s) and dose(s) confirmed: Regimen is correct and unchanged.   Changes to medications: Brinton reports no changes at this time.  Changes to insurance: No  Questions for the pharmacist: No    Confirmed patient received a Conservation officer, historic buildings and a Surveyor, mining with first shipment. The patient will receive a drug information handout for each medication shipped and additional FDA Medication Guides as required.       DISEASE/MEDICATION-SPECIFIC INFORMATION        N/A    SPECIALTY MEDICATION ADHERENCE     Medication Adherence    Patient reported X missed doses in the last month: 0  Specialty Medication: Tacrolimus 1mg   Patient is on additional specialty medications: No  Adherence tools used: patient uses a pill box to manage medications                Tacrolimus 1 mg: 10 days of medicine on hand *    SHIPPING     Shipping address confirmed in Epic.     Delivery Scheduled: Yes, Expected medication delivery date: 12/26/20.     Medication will be delivered via UPS to the prescription address in Epic WAM.    Tera Helper   [facility] Specialty Pharmacist

## 2020-12-22 NOTE — Unmapped (Signed)
Johnny Evans 's tacrolimus shipment will be delayed due to pt called back requested change We have contacted the patient and per pt request he did not have enough med until scheduled delivery date We will reschedule the medication for the delivery date that the patient agreed upon. We have confirmed the delivery date as 04/05 .

## 2020-12-22 NOTE — Unmapped (Signed)
Johnny Evans 's Tacrolimus shipment will be delayed as a result of the patient's insurance being terminated.      I have reached out to the patient  at [phone] and left a voicemail message.  We will wait for a call back from the patient to reschedule the delivery.  We have not confirmed the new delivery date.

## 2020-12-23 NOTE — Unmapped (Signed)
KIDNEY POST-TRANSPLANT ASSESSMENT   Clinical Social Worker Telephone Note    Name:Johnny Evans  Date of Birth:November 03, 1975  WUJ:[id number]    REFERRAL INFORMATION:    Johnny Evans is s/p transplant for kidney transplantation . CSW follows up to assess insurance questions/planning    TRANSPLANT DATE:   05/23/2014 (Kidney)    POST TXP RN COORDINATOR:   Thayer Headings [phone]; fax/([phone]      SUMMARY:  Got email from pt regarding recent communication from [facility].  States that he was told that he no longer had active insurance and that his next shipment of Prograf would be delayed.  Pt states that he has 3 days of Prograf remaining.    Called pt to discuss as this CSW will be out of the office on 12/23/20.  Pt states that he has been receiving SSDI until ~ 6 months ago.  He recalls getting documentation from [facility] but no longer has it.      After a brief discussion, it would appear from the limited information available that pt has been getting SSDI until recently, and therefore had Medicare.  Assuming the above is correct, he likely received his last check several months ago and his Medicare termed 12/19/20.    If this is the case, then pt should be eligible for [facility].  Provided contact information and strongly urged him to call first thing in the AM.  He agreed to do so.  He is aware that this will hopefully get him a few more days of meds so that we can learn more.      Pt then agreed to call [employer] and confirm his Medicare eligibility.  If the above is true, he will ask for another copy of this to be sent to him to show he has no active insurance at this time (noted that EPIC is still showing Medicare Part A).      Pt is aware that this CSW will be out of the office on 12/23/20, but will return on 12/24/20.  He agreed to the above and will touch base again on Wed.      Will convey above to TNC/Heather.      Lowella Petties, LCSW, CCTSW  Transplant Case Manager  [facility] for Transplant Care  12/22/2020

## 2020-12-24 NOTE — Unmapped (Signed)
KIDNEY POST-TRANSPLANT ASSESSMENT   Clinical Social Worker Progress Note    Name:Johnny Evans  Date of Birth:Dec 09, 1975  ZOX:[id number]    REFERRAL INFORMATION:    Johnny Evans is s/p transplant for kidney transplantation . CSW follows up to assess financial questions/planning    TRANSPLANT DATE:   05/23/2014 (Kidney)    POST TXP RN COORDINATOR:   Thayer Headings [phone]; fax/([phone]      SUMMARY:  Multiple emails today regarding insurance and medication refills.  In summary, it would appear that pt's SSDI has now officially ended, as well as his Medicare eligibility (presumably eff 12/19/2020).  Pt is well beyond 3 years post transplant, is no longer deemed disabled by [facility] and is not yet 45yo.  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      Pt contacted this CSW on 12/22/2020 to initially notify her of the above.  Appears that pt did f/up with [employer] on 4/5 and confirmed the above.  Pt spoke w/ Johnny Evans PAP today and plans to come to [facility] on Friday, 4/8 to apply.    This CSW spoke w/ Johnny Evans/customer rep [place] PAP program and verified that pt did call today.  He has already received his annual temp medication pass (07/17/20) and will have to wait for another year before he can receive another.  If he brings all of his paperwork, he can submit, be reviewed and potentially be approved ON THE SAME DAY.      Sent generic email to pt and reiterated the importance of getting to [facility] by the end of the week.  Reiterated needed information and that this could also be done by fax if he was unable to get here in person.      1) Last THREE pay stubs  2) information on current savings $$  3) last 3 months of bank statements (checking & savings, if you have any other kind of bank account, I would bring that too...)  4) proof of [place] residency; can be driver's license, copy of a utility bill, etc; CAN NOT use a [place] bill as proof of residency    Patient should come prepared w/ documentation and hopefully get refill on same day.    Called [place] [facility] and had copy of [facility] sent to pt's home address.    Will continue to follow as needed.        Lowella Petties, LCSW, CCTSW  Transplant Case Manager  [facility] for Transplant Care  12/24/2020

## 2020-12-26 NOTE — Unmapped (Signed)
KIDNEY POST-TRANSPLANT ASSESSMENT   Clinical Social Worker Progress Note    Name:Johnny Evans  Date of Birth:18-May-1976  ZOX:[id number]    REFERRAL INFORMATION:    Johnny Evans is s/p transplant for kidney transplantation . CSW follows up to assess financial questions/planning    TRANSPLANT DATE:   05/23/2014 (Kidney)    POST TXP RN COORDINATOR:   Thayer Headings [phone]; fax/([phone]      SUMMARY:  Rec'd email from pt confirming his intent to come to [facility] tomorrow to apply for [place] PAP.  He states that his car is not working, but he has a friend who has offered him a ride.  Discussed possibility of a gas card, given that he is having to rely on others to get here.  Pt is aware that he has met the annual guidelines for this fiscal year, but this CSW can request an exception.    Sundra Aland request submitted today, but after 5pm.  Will look for response on 12/26/2020.      Lowella Petties, LCSW, CCTSW  Transplant Case Manager  [facility] for Transplant Care  12/25/2020

## 2020-12-28 DIAGNOSIS — Z940 Kidney transplant status: Principal | ICD-10-CM

## 2020-12-28 DIAGNOSIS — I10 Essential (primary) hypertension: Principal | ICD-10-CM

## 2020-12-28 DIAGNOSIS — Z79899 Other long term (current) drug therapy: Principal | ICD-10-CM

## 2020-12-28 DIAGNOSIS — F32A Depression, unspecified depression type: Principal | ICD-10-CM

## 2020-12-28 DIAGNOSIS — E559 Vitamin D deficiency, unspecified: Principal | ICD-10-CM

## 2020-12-28 MED ORDER — SERTRALINE 100 MG TABLET
ORAL_TABLET | 3 refills | 0 days
Start: 2020-12-28 — End: ?

## 2020-12-29 NOTE — Unmapped (Signed)
December 29, 2020 10:17 PM    Called pt to notify gas cards were available. He called back later and said he will come to the main hospital on Wednesday and would call this writer to meet and receive his gas card.    Will hold cards for Wednesday 4/13.    Thomasene Mohair, LCSW, CCTSW  Transplant Social Worker/Case Manager  [facility] for Transplant Care

## 2021-01-01 NOTE — Unmapped (Signed)
KIDNEY POST-TRANSPLANT ASSESSMENT   Clinical Social Worker Progress Note    Name:Johnny Evans  Date of Birth:03-30-1976  ONG:[id number]    REFERRAL INFORMATION:    [name] is s/p transplant for kidney transplantation . CSW follows up to assess financial questions/planning    TRANSPLANT DATE:   05/23/2014 (Kidney)    POST TXP RN COORDINATOR:   Thayer Headings [phone]; fax/([phone]      SUMMARY:  Rec'd VM from pt that he would be coming to [facility] today to apply for Pharmacy Assistance.  Pt confirmed his arrival and met pt at hospital entrance to provide $50 gas card.    Pt states that he is still missing info from his bank, but is on his way to take care of that now.   He has filled out the application and signed, which has to be done in person.  Pt is still missing documentation from his estranged spouse/[name], verifying their separation.  States that he and his ex were civil but that she has recently blocked him.  Offered to call ex myself with his permission, which he provided.    Called [name]/[phone] and left VM.      Rec'd f/up message from pt later in the day w/ requested bank statement information.  After speaking w/ our pharmacy assistance department multiple times today about pt's application, below is a summary of what is needed & why:  ?? Pt appears to be getting a different hourly wage (assuming 2/2 type of work he is doing that day) which is complicating their income calculations (unable to extrapolate out income for week/month)  ?? Pt is listed as married on some documents and single on others.  ?? Pt is still showing as active for Medicare A and SSDI in their system.  ?? Appears to be some question if pt has additional banking accounts that have not been disclosed or resolved?    Pharmacy Assistance will need:  1) Letter from credit union saying that this is your only account???    a. If you have another account, they will need (again) 3 months of statements from that one as well  b. Looks like you turned in documentation for a savings account only?    2) Because it looks like your daily pay rate is different??? they will need the last 15 paycheck stubs.  Don???t have to be consecutive if you didn???t work a day in there, but they will need the last 15 (3/21 forward?)  3) They are still pulling up you as active for ???disability??????. Sometimes there is a delay in that information getting updated.  You will need something from [facility] saying that you are no longer getting benefits/Medicare, etc???    The above sent to patient for urgent attention on 4/15 and again on 4/18.        Lowella Petties, LCSW, CCTSW  Transplant Case Manager  [facility] for Transplant Care  01/01/2021

## 2021-01-02 NOTE — Unmapped (Addendum)
Johnny Evans 's Tacrolimus shipment will be canceled  as a result of the patient's insurance being terminated.      I have reached out to the patient  at [phone] and left a voicemail message.  We will wait for a call back from the patient to reschedule the delivery.  We have canceled this work request.      Patient's part B is blocked. He was made aware of delay on 04/04 and given phone #'s to call and re-enroll/correct the situation. Most recent notes in Epic Encounters from yesterday indicate patient is attempting to apply for Pharmacy Assistance. No new information input into WAM. Called patient today to get an update on the situation, but was only able to leave a voicemail.

## 2021-01-06 DIAGNOSIS — E559 Vitamin D deficiency, unspecified: Principal | ICD-10-CM

## 2021-01-06 DIAGNOSIS — I10 Essential (primary) hypertension: Principal | ICD-10-CM

## 2021-01-06 DIAGNOSIS — D849 Immunodeficiency, unspecified: Principal | ICD-10-CM

## 2021-01-06 DIAGNOSIS — Z940 Kidney transplant status: Principal | ICD-10-CM

## 2021-01-06 MED ORDER — PREDNISONE 5 MG TABLET
ORAL_TABLET | Freq: Every day | ORAL | 11 refills | 30 days | Status: CP
Start: 2021-01-06 — End: 2022-01-06
  Filled 2021-01-06: qty 30, 30d supply, fill #0

## 2021-01-06 MED FILL — TACROLIMUS 1 MG CAPSULE, IMMEDIATE-RELEASE: ORAL | 7 days supply | Qty: 35 | Fill #4

## 2021-01-06 NOTE — Unmapped (Signed)
[facility] Refill Coordination Note    Specialty Medication(s) to be Shipped:   Transplant: tacrolimus 1mg  and Prednisone 5mg     Other medication(s) to be shipped: No additional medications requested for fill at this time     Johnny Evans, DOB: [birth year]  Phone: [phone] (home)       All above HIPAA information was verified with patient.     Was a Nurse, learning disability used for this call? No    Completed refill call assessment today to schedule patient's medication shipment from the [facility] [phone]).  All relevant notes have been reviewed.     Specialty medication(s) and dose(s) confirmed: Regimen is correct and unchanged.   Changes to medications: Johnny Evans reports no changes at this time.  Changes to insurance: No  New side effects reported not previously addressed with a pharmacist or physician: None reported  Questions for the pharmacist: No    Confirmed patient received a Conservation officer, historic buildings and a Surveyor, mining with first shipment. The patient will receive a drug information handout for each medication shipped and additional FDA Medication Guides as required.       DISEASE/MEDICATION-SPECIFIC INFORMATION        N/A    SPECIALTY MEDICATION ADHERENCE     Medication Adherence    Patient reported X missed doses in the last month: 2  Specialty Medication: Tacrolimus 1mg   Patient is on additional specialty medications: No  Adherence tools used: patient uses a pill box to manage medications              Were doses missed due to medication being on hold? No    Tacrolimus 1 mg: 0 days of medicine on hand   Prednisone 5 mg: 0 days of medicine on hand     REFERRAL TO PHARMACIST     Referral to the pharmacist: Not needed      [facility]     Shipping address confirmed in Epic.     Delivery Scheduled: Yes, Expected medication delivery date: 01/07/21.     Medication will be delivered via UPS to the prescription address in Epic WAM.    Johnny Evans   [facility] Specialty Pharmacist

## 2021-01-14 NOTE — Unmapped (Signed)
[facility] Refill Coordination Note    Specialty Medication(s) to be Shipped:   Transplant: tacrolimus 1mg     Other medication(s) to be shipped: No additional medications requested for fill at this time     Johnny Evans, DOB: 02-Jan-1976  Phone: [phone] (home)       All above HIPAA information was verified with patient.     Was a Nurse, learning disability used for this call? No    Completed refill call assessment today to schedule patient's medication shipment from the [facility] [phone]).  All relevant notes have been reviewed.     Specialty medication(s) and dose(s) confirmed: Regimen is correct and unchanged.   Changes to medications: Surafel reports no changes at this time.  Changes to insurance: No  New side effects reported not previously addressed with a pharmacist or physician: None reported  Questions for the pharmacist: No    Confirmed patient received a Conservation officer, historic buildings and a Surveyor, mining with first shipment. The patient will receive a drug information handout for each medication shipped and additional FDA Medication Guides as required.       DISEASE/MEDICATION-SPECIFIC INFORMATION        N/A    SPECIALTY MEDICATION ADHERENCE     Medication Adherence    Patient reported X missed doses in the last month: 0  Specialty Medication: tacrolimus  Patient is on additional specialty medications: No  Patient is on more than two specialty medications: No  Any gaps in refill history greater than 2 weeks in the last 3 months: no  Demonstrates understanding of importance of adherence: yes  Informant: patient  Adherence tools used: patient uses a pill box to manage medications              Were doses missed due to medication being on hold? No    Tacrolimus 1mg : Patient has 1 day of medication on hand     REFERRAL TO PHARMACIST     Referral to the pharmacist: Not needed      [facility]     Shipping address confirmed in Epic.     Delivery Scheduled: Yes, Expected medication delivery date: 4/29.     Medication will be delivered via UPS to the prescription address in Epic WAM.    Olga Millers   [facility] Specialty Technician

## 2021-01-15 MED FILL — TACROLIMUS 1 MG CAPSULE, IMMEDIATE-RELEASE: ORAL | 7 days supply | Qty: 35 | Fill #5

## 2021-01-20 MED ORDER — SERTRALINE 100 MG TABLET
ORAL_TABLET | 3 refills | 0 days
Start: 2021-01-20 — End: ?

## 2021-01-20 NOTE — Unmapped (Signed)
error 

## 2021-01-22 MED FILL — TACROLIMUS 1 MG CAPSULE, IMMEDIATE-RELEASE: ORAL | 7 days supply | Qty: 35 | Fill #6

## 2021-01-22 NOTE — Unmapped (Signed)
[facility] Refill Coordination Note    Specialty Medication(s) to be Shipped:   Transplant: tacrolimus 1mg   Other medication(s) to be shipped: No additional medications requested for fill at this time     Johnny Evans, DOB: 12-Jul-1976  Phone: [phone] (home)     All above HIPAA information was verified with patient.     Was a Nurse, learning disability used for this call? No    Completed refill call assessment today to schedule patient's medication shipment from the [facility] [phone]).  All relevant notes have been reviewed.     Specialty medication(s) and dose(s) confirmed: Regimen is correct and unchanged.   Changes to medications: Keante reports no changes at this time.  Changes to insurance: No  New side effects reported not previously addressed with a pharmacist or physician: None reported  Questions for the pharmacist: No    Confirmed patient received a Conservation officer, historic buildings and a Surveyor, mining with first shipment. The patient will receive a drug information handout for each medication shipped and additional FDA Medication Guides as required.       DISEASE/MEDICATION-SPECIFIC INFORMATION        N/A    SPECIALTY MEDICATION ADHERENCE     Medication Adherence    Patient reported X missed doses in the last month: 0  Specialty Medication: Tacrolimus 1mg   Patient is on additional specialty medications: No  Patient is on more than two specialty medications: No  Informant: patient  Reliability of informant: reliable  Reasons for non-adherence: no problems identified  Adherence tools used: patient uses a pill box to manage medications        Were doses missed due to medication being on hold? No    Tacrolimus 1 mg: 2 days of medicine on hand     REFERRAL TO PHARMACIST     Referral to the pharmacist: Not needed    [facility]     Shipping address confirmed in Epic.     Delivery Scheduled: Yes, Expected medication delivery date: 01/23/2021.     Medication will be delivered via UPS to the prescription address in Epic WAM.    Enora Trillo P [facility] Specialty Technician

## 2021-01-28 ENCOUNTER — Ambulatory Visit: Admit: 2021-01-28 | Discharge: 2021-01-28 | Payer: MEDICARE

## 2021-01-28 ENCOUNTER — Ambulatory Visit: Admit: 2021-01-28 | Discharge: 2021-01-28 | Payer: MEDICARE | Attending: Nephrology | Primary: Nephrology

## 2021-01-28 DIAGNOSIS — Z79899 Other long term (current) drug therapy: Principal | ICD-10-CM

## 2021-01-28 DIAGNOSIS — Z940 Kidney transplant status: Principal | ICD-10-CM

## 2021-01-28 LAB — PROTEIN / CREATININE RATIO, URINE
CREATININE, URINE: 205.3 mg/dL
PROTEIN URINE: 12.7 mg/dL
PROTEIN/CREAT RATIO, URINE: 0.062

## 2021-01-28 LAB — CBC W/ AUTO DIFF
BASOPHILS ABSOLUTE COUNT: 0 10*9/L (ref 0.0–0.1)
BASOPHILS RELATIVE PERCENT: 0.5 %
EOSINOPHILS ABSOLUTE COUNT: 0.4 10*9/L (ref 0.0–0.5)
EOSINOPHILS RELATIVE PERCENT: 4.5 %
HEMATOCRIT: 44.7 % (ref 39.0–48.0)
HEMOGLOBIN: 15 g/dL (ref 12.9–16.5)
LYMPHOCYTES ABSOLUTE COUNT: 1.9 10*9/L (ref 1.1–3.6)
LYMPHOCYTES RELATIVE PERCENT: 23.2 %
MEAN CORPUSCULAR HEMOGLOBIN CONC: 33.6 g/dL (ref 32.0–36.0)
MEAN CORPUSCULAR HEMOGLOBIN: 29.9 pg (ref 25.9–32.4)
MEAN CORPUSCULAR VOLUME: 88.8 fL (ref 77.6–95.7)
MEAN PLATELET VOLUME: 8.3 fL (ref 6.8–10.7)
MONOCYTES ABSOLUTE COUNT: 0.5 10*9/L (ref 0.3–0.8)
MONOCYTES RELATIVE PERCENT: 6.2 %
NEUTROPHILS ABSOLUTE COUNT: 5.3 10*9/L (ref 1.8–7.8)
NEUTROPHILS RELATIVE PERCENT: 65.6 %
NUCLEATED RED BLOOD CELLS: 0 /100{WBCs} (ref <=4)
PLATELET COUNT: 217 10*9/L (ref 150–450)
RED BLOOD CELL COUNT: 5.03 10*12/L (ref 4.26–5.60)
RED CELL DISTRIBUTION WIDTH: 15.1 % (ref 12.2–15.2)
WBC ADJUSTED: 8.2 10*9/L (ref 3.6–11.2)

## 2021-01-28 LAB — URINALYSIS
BACTERIA: NONE SEEN /HPF
BILIRUBIN UA: NEGATIVE
BLOOD UA: NEGATIVE
GLUCOSE UA: NEGATIVE
KETONES UA: NEGATIVE
LEUKOCYTE ESTERASE UA: NEGATIVE
NITRITE UA: NEGATIVE
PH UA: 6.5 (ref 5.0–9.0)
PROTEIN UA: NEGATIVE
RBC UA: <1 /HPF (ref <3)
SPECIFIC GRAVITY UA: 1.025 (ref 1.005–1.030)
SQUAMOUS EPITHELIAL: <1 /HPF (ref 0–5)
UROBILINOGEN UA: 0.2
WBC UA: 3 /HPF — ABNORMAL HIGH (ref <2)

## 2021-01-28 LAB — LIPID PANEL
CHOLESTEROL/HDL RATIO SCREEN: 4.4 (ref 1.0–4.5)
CHOLESTEROL: 208 mg/dL — ABNORMAL HIGH (ref <=200)
HDL CHOLESTEROL: 47 mg/dL (ref 40–60)
LDL CHOLESTEROL CALCULATED: 145 mg/dL — ABNORMAL HIGH (ref 40–99)
NON-HDL CHOLESTEROL: 161 mg/dL — ABNORMAL HIGH (ref 70–130)
TRIGLYCERIDES: 82 mg/dL (ref 0–150)
VLDL CHOLESTEROL CAL: 16.4 mg/dL (ref 11–50)

## 2021-01-28 LAB — COMPREHENSIVE METABOLIC PANEL
ALBUMIN: 4 g/dL (ref 3.4–5.0)
ALKALINE PHOSPHATASE: 89 U/L (ref 46–116)
ALT (SGPT): 10 U/L (ref 10–49)
ANION GAP: 4 mmol/L — ABNORMAL LOW (ref 5–14)
AST (SGOT): 13 U/L (ref <=34)
BILIRUBIN TOTAL: 0.6 mg/dL (ref 0.3–1.2)
BLOOD UREA NITROGEN: 13 mg/dL (ref 9–23)
BUN / CREAT RATIO: 13
CALCIUM: 9.9 mg/dL (ref 8.7–10.4)
CHLORIDE: 109 mmol/L — ABNORMAL HIGH (ref 98–107)
CO2: 27.8 mmol/L (ref 20.0–31.0)
CREATININE: 1.02 mg/dL
EGFR CKD-EPI AA MALE: >90 mL/min/{1.73_m2} (ref >=60)
EGFR CKD-EPI NON-AA MALE: 88 mL/min/{1.73_m2} (ref >=60)
GLUCOSE RANDOM: 103 mg/dL — ABNORMAL HIGH (ref 70–99)
POTASSIUM: 4 mmol/L (ref 3.4–4.8)
PROTEIN TOTAL: 7 g/dL (ref 5.7–8.2)
SODIUM: 141 mmol/L (ref 135–145)

## 2021-01-28 LAB — PHOSPHORUS: PHOSPHORUS: 2.7 mg/dL (ref 2.4–5.1)

## 2021-01-28 LAB — HEMOGLOBIN A1C
ESTIMATED AVERAGE GLUCOSE: 108 mg/dL
HEMOGLOBIN A1C: 5.4 % (ref 4.8–5.6)

## 2021-01-28 LAB — MAGNESIUM: MAGNESIUM: 1.6 mg/dL (ref 1.6–2.6)

## 2021-01-28 LAB — PARATHYROID HORMONE (PTH): PARATHYROID HORMONE INTACT: 85.4 pg/mL — ABNORMAL HIGH (ref 18.4–80.1)

## 2021-01-28 LAB — VITAMIN D 25 HYDROXY: VITAMIN D, TOTAL (25OH): 31.9 ng/mL (ref 20.0–80.0)

## 2021-01-28 MED ORDER — BUPROPION HCL XL 150 MG 24 HR TABLET, EXTENDED RELEASE
ORAL_TABLET | Freq: Every morning | ORAL | 11 refills | 30 days | Status: CP
Start: 2021-01-28 — End: 2022-01-28

## 2021-01-28 MED ORDER — SILDENAFIL 50 MG TABLET
ORAL_TABLET | Freq: Every day | ORAL | 2 refills | 30 days | Status: CP | PRN
Start: 2021-01-28 — End: 2022-01-28

## 2021-01-28 NOTE — Unmapped (Signed)
KIDNEY POST-TRANSPLANT ASSESSMENT   Clinical Social Worker Progress Note    Name:Johnny Evans  Date of Birth:[birth year]  UJW:[id number]    REFERRAL INFORMATION:    Johnny Evans is s/p transplant for kidney transplantation . CSW follows up to assess general questions    TRANSPLANT DATE:   05/23/2014 (Kidney)    POST TXP RN COORDINATOR:   Johnny Evans [phone]; fax/([phone]    SUMMARY:   Ongoing efforts to assist pt w/ Pharm Assist application.  Pt has been getting meds in 7 day supplies 2/2 self pay.  Pt at [facility] today for appt and has paycheck stubs.  Agreed to take over to main campus and give to [employer]      Spoke briefly w/ Production designer, theatre/television/film today.  Verify that documentation from estranged wife is not sufficient.  Will need something official that lists her address as different from his.  Reached out again to exwife/Johnny Evans for additional documentation.      Johnny Petties, LCSW, CCTSW  Transplant Case Manager  [facility] for Transplant Care  01/28/2021

## 2021-01-28 NOTE — Unmapped (Signed)
AOBP:    Right     arm        Large       cuff     Average : 118/79               Pulse: 61    1st reading:  121/80           Pulse: 61    2nd reading:  110/79          Pulse: 62    3rd reading:  122/78           Pulse: 61

## 2021-01-28 NOTE — Unmapped (Signed)
Administered Pfizercovid vaccine to patients L arm.  This was their third dose.  Patient sat in observervation for 15 minutes and had no complications.  Patient was instructed to seek medical attention if complications arise.

## 2021-01-28 NOTE — Unmapped (Signed)
Transplant Coordinator, Clinic Visit   Pt seen today by transplant nephrology for follow up, reviewed medications and symptoms.          01/28/21 1044   BP: 118/79   Pulse: 61   Temp: 36.3 ??C (97.3 ??F)   Weight: 94.6 kg (208 lb 9.6 oz)   Height: 185.4 cm (6' 1)   PainSc: 0-No pain       Assessment  BP: home bp's 120's systolic at highest  Lightheaded: denies  Headache: occasional  Hand tremors: mild  Numbness/tingling: no  Fevers: denies  Chills/sweats: denies  Shortness of breath: denies  Chest pain or pressure: denies  Palpitations: denies  Abdominal pain: denies  Heart burn: denies  Nausea/vomiting: denies  Diarrhea/constipation: denies  UTI symptoms: denies  Swelling: none  Sleep: trouble sleeping  Pain: denies      Good appetite; reports adequate hydration.     Intake: adequate  Output: adequate    Any new medications? no  Immunosuppressant last taken: after labs today    Immunization status: 3rd Pfizer today    Patient reports overall feeling well.  He says he feels like Zoloft is not working.  He complains of feeling more tired and sluggish as well as headaches.  Speaking with Dr. Toni Arthurs about a change in medication.    Functional Score: 100   Normal no complaints; no evidence of  disease.       I spent a total of 15 minutes with Johnny Evans reviewing medications and symptoms.

## 2021-01-29 LAB — TACROLIMUS LEVEL, TROUGH: TACROLIMUS, TROUGH: 3.6 ng/mL — ABNORMAL LOW (ref 5.0–15.0)

## 2021-01-29 MED FILL — TACROLIMUS 1 MG CAPSULE, IMMEDIATE-RELEASE: ORAL | 30 days supply | Qty: 150 | Fill #7

## 2021-01-29 MED FILL — PREDNISONE 5 MG TABLET: ORAL | 30 days supply | Qty: 30 | Fill #1

## 2021-01-29 NOTE — Unmapped (Signed)
[facility] Refill Coordination Note    Specialty Medication(s) to be Shipped:   Transplant: tacrolimus 1mg  and Prednisone 5mg     Other medication(s) to be shipped: No additional medications requested for fill at this time     Johnny Evans, DOB: 05/23/1976  Phone: [phone] (home)       All above HIPAA information was verified with patient.     Was a Nurse, learning disability used for this call? No    Completed refill call assessment today to schedule patient's medication shipment from the [facility] [phone]).  All relevant notes have been reviewed.     Specialty medication(s) and dose(s) confirmed: Regimen is correct and unchanged.   Changes to medications: Johnny Evans reports no changes at this time.  Changes to insurance: No  New side effects reported not previously addressed with a pharmacist or physician: None reported  Questions for the pharmacist: No    Confirmed patient received a Conservation officer, historic buildings and a Surveyor, mining with first shipment. The patient will receive a drug information handout for each medication shipped and additional FDA Medication Guides as required.       DISEASE/MEDICATION-SPECIFIC INFORMATION        N/A    SPECIALTY MEDICATION ADHERENCE     Medication Adherence    Patient reported X missed doses in the last month: 0  Specialty Medication: tacrolimus (PROGRAF) 1 MG capsule  Patient is on additional specialty medications: No  Adherence tools used: patient uses a pill box to manage medications              Were doses missed due to medication being on hold? No    Tacrolimsu 1mg : 3 days worth of medication on hand.  Prednisone 5mg   8 days worth of medication on hand.        REFERRAL TO PHARMACIST     Referral to the pharmacist: Not needed      [facility]     Shipping address confirmed in Epic.     Delivery Scheduled: Yes, Expected medication delivery date: 01/30/21.     Medication will be delivered via UPS to the prescription address in Epic WAM.    Johnny Evans   [facility] Specialty Technician

## 2021-01-30 LAB — CMV DNA, QUANTITATIVE, PCR: CMV VIRAL LD: NOT DETECTED

## 2021-01-31 LAB — BK VIRUS QUANTITATIVE PCR, BLOOD: BK BLOOD RESULT: NOT DETECTED

## 2021-02-02 LAB — EBV QUANTITATIVE PCR, BLOOD: EBV VIRAL LOAD RESULT: NOT DETECTED

## 2021-02-03 NOTE — Unmapped (Signed)
Called pt regarding Tac of 3.6 on 5/11.  Patient confirmed Tac trough is true.  Meds taken at 2130 on 5/10.  Labs collected on 5/11 at 1000.  Message sent to Dr. Toni Arthurs regarding increasing Tac dose.

## 2021-02-05 MED ORDER — TACROLIMUS 1 MG CAPSULE, IMMEDIATE-RELEASE
ORAL_CAPSULE | Freq: Two times a day (BID) | ORAL | 11 refills | 30 days | Status: CP
Start: 2021-02-05 — End: 2022-02-05
  Filled 2021-02-25: qty 180, 30d supply, fill #0

## 2021-02-06 DIAGNOSIS — Z940 Kidney transplant status: Principal | ICD-10-CM

## 2021-02-06 NOTE — Unmapped (Signed)
Called patient regarding Dr Alveda Reasons recommendation to increase Tacrolimus to 3 mg BID.  Patient notified of changes and verbalized understanding.

## 2021-02-06 NOTE — Unmapped (Addendum)
5/20: no notes in chart that patient is aware of this dose change. Will set up patient for a clinical call today to verify he is aware of this (won't need med yet) -ef    Clinical Assessment Needed For: Dose Change  Medication: Tacrolimus 1mg  capsule  Last Fill Date/Day Supply: 01/29/2021 / 30 days  Refill Too Soon until 02/22/2021  Was previous dose already scheduled to fill: No    Notes to Pharmacist: Will re-test on 06/06

## 2021-02-06 NOTE — Unmapped (Signed)
[facility] [facility] [employer] transplant nephrology clinic visit    assessment and plan:  1. s/p kidney transplant 05/23/2014. baseline creatinine 1-1.2 mg/dl. no proteinuria. no donor specific hla ab detected.   2. immunosuppression. prednisone 5mg  daily. tacrolimus incr 3mg  bid; 12hr lvl 4-7 ng/ml.  3. hypertension. blood pressure goal < 130/80 mmhg  4. anxiety/depression. +buproprion xl 150mg  daily-bid initiated.  5. preventive medicine. influenza '20. pcv13 pneumococcal '14. ppsv23 pneumococcal '16. covid-19 pfizer '22. kidney ultrasound '20.    mr. Johnny Evans is a 45 year old gentleman seen in follow up post kidney transplant 05/23/2014.    past medical hx:  1. 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 focal segmental glomerulosclerosis. alemtuzumab induction. baseline creatinine 1-1.2 mg/dl.  > kidney bx 12/15: +stage1 bk nephropathy. no acute/chr rejection.  2. hypertension  3. hyperlipidemia  4. echocardiogram '14: nl lv. lvef > 55%. diastolic lv dysfunction.  5. atrial fibrillation  6. gout  7. hx asthma/reactive airways disease    past surgical hx: left upper ext av fistula '10. kidney transplant '15.    allergies: ace inh    medications: tacrolimus 3mg /2mg  am/pm, prednisone 5mg  daily    soc hx: married/separated x2 sons. former smoking hx; quit '12.    physical exam: t97.3 p61 bp118/79 wt94.6kg bmi 27.5. wd/wn gentleman appropriate affect and mood. nl sclera anicteric. neck supple no palpable ln. heart rrr nl s1s2. lungs clear bilateral. abd soft nt/nd. no lower ext edema. 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 msk no synovitis or tophi. skin no rash. neuro alert oriented non focal exam.    labs 01/28/21: creatinine 1.0. tacrolimus 12+hr lvl 3.6 ng/ml.

## 2021-02-09 NOTE — Unmapped (Signed)
Spoke with Johnny Evans about the dose change on his tacrolimus from 3/2 to 3 BID.   Patient confirmed he was aware of the dose change.       [facility] Clinical Assessment & Refill Coordination Note    Johnny Evans, DOB: 1976-03-15  Phone: [phone] (home)     All above HIPAA information was verified with patient.     Was a Nurse, learning disability used for this call? No    Specialty Medication(s):   Transplant: tacrolimus 1mg      Current Outpatient Medications   Medication Sig Dispense Refill   ??? aspirin (ECOTRIN) 81 MG tablet Take 81 mg by mouth daily.     ??? buPROPion (WELLBUTRIN XL) 150 MG 24 hr tablet Take 2 tablets (300 mg total) by mouth every morning. 60 tablet 11   ??? cholecalciferol, vitamin D3, (VITAMIN D3) 25 mcg (1,000 unit) capsule Take 2 capsules (2,000 Units total) by mouth daily.     ??? OMEGA-3/DHA/EPA/FISH OIL (FISH OIL-OMEGA-3 FATTY ACIDS) 300-1,000 mg capsule Take 2 capsules (2 g total) by mouth daily. 90 capsule 0   ??? predniSONE (DELTASONE) 5 MG tablet Take 1 tablet (5 mg total) by mouth daily. 30 tablet 11   ??? sertraline (ZOLOFT) 100 MG tablet TAKE 1 TABLET BY MOUTH EVERY DAY 30 tablet 6   ??? sildenafiL (VIAGRA) 50 MG tablet Take 1 tablet (50 mg total) by mouth daily as needed for erectile dysfunction. 30 tablet 2   ??? tacrolimus (PROGRAF) 1 MG capsule Take 3 capsules (3 mg total) by mouth two (2) times a day. 180 capsule 11     No current facility-administered medications for this visit.        Changes to medications: Quentin reports no changes at this time.    Allergies   Allergen Reactions   ??? Ace Inhibitors      GI upset       Changes to allergies: No    SPECIALTY MEDICATION ADHERENCE     Tacrolimus 1 mg: 15 days of medicine on hand     Medication Adherence    Patient reported X missed doses in the last month: 0  Specialty Medication: Tacrolimus 1mg   Patient is on additional specialty medications: No  Adherence tools used: patient uses a pill box to manage medications          Specialty medication(s) dose(s) confirmed: Regimen is correct and unchanged.     Are there any concerns with adherence? No    Adherence counseling provided? Not needed    CLINICAL MANAGEMENT AND INTERVENTION      Clinical Benefit Assessment:    Do you feel the medicine is effective or helping your condition? Yes    Clinical Benefit counseling provided? Not needed    Adverse Effects Assessment:    Are you experiencing any side effects? No    Are you experiencing difficulty administering your medicine? No    Quality of Life Assessment:    How many days over the past month did your kidney transplant  keep you from your normal activities? For example, brushing your teeth or getting up in the morning. 0    Have you discussed this with your provider? Not needed    Acute Infection Status:    Acute infections noted within Epic:  No active infections  Patient reported infection: None    Therapy Appropriateness:    Is therapy appropriate? Yes, therapy is appropriate and should be continued  DISEASE/MEDICATION-SPECIFIC INFORMATION      N/A    PATIENT SPECIFIC NEEDS     - Does the patient have any physical, cognitive, or cultural barriers? No    - Is the patient high risk? No    - Does the patient require a Care Management Plan? No     - Does the patient require physician intervention or other additional services (i.e. nutrition, smoking cessation, social work)? No      SHIPPING     Specialty Medication(s) to be Shipped:   Transplant: None- Patient has more than 2 weeks of medication on hand and declined tacrolimus today.    Other medication(s) to be shipped: No additional medications requested for fill at this time     Changes to insurance: No    Delivery Scheduled: Patient declined refill at this time due to has at least 2 weeks of medication on hand..     Medication will be delivered via UPS to the confirmed prescription address in [facility].    The patient will receive a drug information handout for each medication shipped and additional FDA Medication Guides as required.  Verified that patient has previously received a Conservation officer, historic buildings and a Surveyor, mining.    The patient or caregiver noted above participated in the development of this care plan and knows that they can request review of or adjustments to the care plan at any time.      All of the patient's questions and concerns have been addressed.    Tera Helper   [facility] Specialty Pharmacist

## 2021-02-10 LAB — FSAB CLASS 1 ANTIBODY SPECIFICITY: HLA CLASS 1 ANTIBODY RESULT: POSITIVE

## 2021-02-10 LAB — FSAB CLASS 2 ANTIBODY SPECIFICITY: HLA CL2 AB RESULT: POSITIVE

## 2021-02-11 LAB — HLA DS POST TRANSPLANT
ANTI-DONOR DRW #1 MFI: 799 MFI
ANTI-DONOR DRW #2 MFI: 360 MFI
ANTI-DONOR HLA-A #1 MFI: 0 MFI
ANTI-DONOR HLA-A #2 MFI: 8 MFI
ANTI-DONOR HLA-B #1 MFI: 6 MFI
ANTI-DONOR HLA-B #2 MFI: 0 MFI
ANTI-DONOR HLA-C #1 MFI: 0 MFI
ANTI-DONOR HLA-C #2 MFI: 18 MFI
ANTI-DONOR HLA-DP AG #1 MFI: 360 MFI
ANTI-DONOR HLA-DQB #2 MFI: 805 MFI
ANTI-DONOR HLA-DR #1 MFI: 351 MFI
ANTI-DONOR HLA-DR #2 MFI: 223 MFI

## 2021-02-19 NOTE — Unmapped (Signed)
6/2: pt verified he is taking tac 3mg  bid at this time [name]    [facility] Clinical Assessment & Refill Coordination Note    Johnny Evans, DOB: May 28, 1976  Phone: [phone] (home)     All above HIPAA information was verified with patient.     Was a Nurse, learning disability used for this call? No    Specialty Medication(s):   Transplant: tacrolimus 1mg  and Prednisone 5mg      Current Outpatient Medications   Medication Sig Dispense Refill   ??? aspirin (ECOTRIN) 81 MG tablet Take 81 mg by mouth daily.     ??? buPROPion (WELLBUTRIN XL) 150 MG 24 hr tablet Take 2 tablets (300 mg total) by mouth every morning. 60 tablet 11   ??? cholecalciferol, vitamin D3, (VITAMIN D3) 25 mcg (1,000 unit) capsule Take 2 capsules (2,000 Units total) by mouth daily.     ??? OMEGA-3/DHA/EPA/FISH OIL (FISH OIL-OMEGA-3 FATTY ACIDS) 300-1,000 mg capsule Take 2 capsules (2 g total) by mouth daily. 90 capsule 0   ??? predniSONE (DELTASONE) 5 MG tablet Take 1 tablet (5 mg total) by mouth daily. 30 tablet 11   ??? sertraline (ZOLOFT) 100 MG tablet TAKE 1 TABLET BY MOUTH EVERY DAY 30 tablet 6   ??? sildenafiL (VIAGRA) 50 MG tablet Take 1 tablet (50 mg total) by mouth daily as needed for erectile dysfunction. 30 tablet 2   ??? tacrolimus (PROGRAF) 1 MG capsule Take 3 capsules (3 mg total) by mouth two (2) times a day. 180 capsule 11     No current facility-administered medications for this visit.        Changes to medications: see tac below    Allergies   Allergen Reactions   ??? Ace Inhibitors      GI upset       Changes to allergies: No    SPECIALTY MEDICATION ADHERENCE     Tacrolimus 1mg   : 12 days of medicine on hand   Prednisone 5mg   : 12 days of medicine on hand     Medication Adherence    Patient reported X missed doses in the last month: 0  Specialty Medication: tacrolimus 1mg   Patient is on additional specialty medications: Yes  Additional Specialty Medications: Prednisone 5mg   Patient Reported Additional Medication X Missed Doses in the Last Month: 0  Adherence tools used: patient uses a pill box to manage medications          Specialty medication(s) dose(s) confirmed: Patient reports changes to the regimen as follows: tac is 3mg  bid     Are there any concerns with adherence? No    Adherence counseling provided? Not needed    CLINICAL MANAGEMENT AND INTERVENTION      Clinical Benefit Assessment:    Do you feel the medicine is effective or helping your condition? Yes    Clinical Benefit counseling provided? Not needed    Adverse Effects Assessment:    Are you experiencing any side effects? No    Are you experiencing difficulty administering your medicine? No    Quality of Life Assessment:    How many days over the past month did your transplant  keep you from your normal activities? For example, brushing your teeth or getting up in the morning. 0    Have you discussed this with your provider? Not needed    Acute Infection Status:    Acute infections noted within Epic:  No active infections  Patient reported infection: None  Therapy Appropriateness:    Is therapy appropriate? Yes, therapy is appropriate and should be continued    DISEASE/MEDICATION-SPECIFIC INFORMATION      N/A    PATIENT SPECIFIC NEEDS     - Does the patient have any physical, cognitive, or cultural barriers? No    - Is the patient high risk? No    - Does the patient require a Care Management Plan? No     - Does the patient require physician intervention or other additional services (i.e. nutrition, smoking cessation, social work)? No      SHIPPING     Specialty Medication(s) to be Shipped:   Transplant: tacrolimus 1mg  and Prednisone 5mg     Other medication(s) to be shipped: No additional medications requested for fill at this time     Changes to insurance: No    Delivery Scheduled: Yes, Expected medication delivery date: 02/26/2021.     Medication will be delivered via UPS to the confirmed prescription address in [facility].    The patient will receive a drug information handout for each medication shipped and additional FDA Medication Guides as required.  Verified that patient has previously received a Conservation officer, historic buildings and a Surveyor, mining.    The patient or caregiver noted above participated in the development of this care plan and knows that they can request review of or adjustments to the care plan at any time.      All of the patient's questions and concerns have been addressed.    Thad Ranger   [facility] Specialty Pharmacist

## 2021-02-23 NOTE — Unmapped (Signed)
Therapy Update Follow Up: No issues - Copay = $0

## 2021-02-25 MED FILL — PREDNISONE 5 MG TABLET: ORAL | 30 days supply | Qty: 30 | Fill #2

## 2021-03-19 NOTE — Unmapped (Signed)
[facility] Refill Coordination Note    Specialty Medication(s) to be Shipped:   Transplant: tacrolimus 1mg  and Prednisone 5mg     Other medication(s) to be shipped: No additional medications requested for fill at this time     Johnny Evans, DOB: [birth year]  Phone: [phone] (home)       All above HIPAA information was verified with patient.     Was a Nurse, learning disability used for this call? No    Completed refill call assessment today to schedule patient's medication shipment from the [facility] [phone]).  All relevant notes have been reviewed.     Specialty medication(s) and dose(s) confirmed: Regimen is correct and unchanged.   Changes to medications: Acel reports no changes at this time.  Changes to insurance: No  New side effects reported not previously addressed with a pharmacist or physician: None reported  Questions for the pharmacist: No    Confirmed patient received a Conservation officer, historic buildings and a Surveyor, mining with first shipment. The patient will receive a drug information handout for each medication shipped and additional FDA Medication Guides as required.       DISEASE/MEDICATION-SPECIFIC INFORMATION        N/A    SPECIALTY MEDICATION ADHERENCE     Medication Adherence    Patient reported X missed doses in the last month: 0  Specialty Medication: Prednisone 5mg   Patient is on additional specialty medications: Yes  Additional Specialty Medications: Tacrolimus 1mg   Patient Reported Additional Medication X Missed Doses in the Last Month: 0  Patient is on more than two specialty medications: No  Informant: patient  Adherence tools used: patient uses a pill box to manage medications  Confirmed plan for next specialty medication refill: delivery by pharmacy  Refills needed for supportive medications: not needed          Refill Coordination    Has the Patients' Contact Information Changed: No  Is the Shipping Address Different: No         Were doses missed due to medication being on hold? No    TACROLIMUS 1 mg: 7 days of medicine on hand   PREDNISONE 5 mg: 7 days of medicine on hand       REFERRAL TO PHARMACIST     Referral to the pharmacist: Not needed      [facility]     Shipping address confirmed in Epic.     Delivery Scheduled: Yes, Expected medication delivery date: 7/6.     Medication will be delivered via UPS to the prescription address in Epic WAM.    Jolene Schimke   [facility] Specialty Technician

## 2021-03-24 MED FILL — TACROLIMUS 1 MG CAPSULE, IMMEDIATE-RELEASE: ORAL | 30 days supply | Qty: 180 | Fill #1

## 2021-03-24 MED FILL — PREDNISONE 5 MG TABLET: ORAL | 30 days supply | Qty: 30 | Fill #3

## 2021-03-26 NOTE — Unmapped (Signed)
error 

## 2021-04-10 NOTE — Unmapped (Signed)
Called patient to remind of follow up labs.  Left message.  Patient needs to repeat Tac level.  Left call back number If questions.

## 2021-04-21 NOTE — Unmapped (Addendum)
[facility] Clinical Assessment & Refill Coordination Note    Johnny Evans, DOB: 1976-01-11  Phone: [phone] (home)     All above HIPAA information was verified with patient.     Was a Nurse, learning disability used for this call? No    Specialty Medication(s):   Transplant: tacrolimus 1mg  and Prednisone 5mg      Current Outpatient Medications   Medication Sig Dispense Refill   ??? aspirin (ECOTRIN) 81 MG tablet Take 81 mg by mouth daily.     ??? buPROPion (WELLBUTRIN XL) 150 MG 24 hr tablet Take 2 tablets (300 mg total) by mouth every morning. 60 tablet 11   ??? cholecalciferol, vitamin D3, (VITAMIN D3) 25 mcg (1,000 unit) capsule Take 2 capsules (2,000 Units total) by mouth daily.     ??? OMEGA-3/DHA/EPA/FISH OIL (FISH OIL-OMEGA-3 FATTY ACIDS) 300-1,000 mg capsule Take 2 capsules (2 g total) by mouth daily. 90 capsule 0   ??? predniSONE (DELTASONE) 5 MG tablet Take 1 tablet (5 mg total) by mouth daily. 30 tablet 11   ??? sertraline (ZOLOFT) 100 MG tablet TAKE 1 TABLET BY MOUTH EVERY DAY 30 tablet 6   ??? sildenafiL (VIAGRA) 50 MG tablet Take 1 tablet (50 mg total) by mouth daily as needed for erectile dysfunction. 30 tablet 2   ??? tacrolimus (PROGRAF) 1 MG capsule Take 3 capsules (3 mg total) by mouth two (2) times a day. 180 capsule 11     No current facility-administered medications for this visit.        Changes to medications: Johnny Evans reports no changes at this time.    Allergies   Allergen Reactions   ??? Ace Inhibitors      GI upset       Changes to allergies: No    SPECIALTY MEDICATION ADHERENCE     Tacrolimus 1mg   : 5 days of medicine on hand   Prednisone 5mg   : 5 days of medicine on hand     Medication Adherence    Patient reported X missed doses in the last month: 0  Specialty Medication: Prednisone 5mg   Patient is on additional specialty medications: Yes  Additional Specialty Medications: Tacrolimus 1mg   Patient Reported Additional Medication X Missed Doses in the Last Month: 0  Patient is on more than two specialty medications: No  Adherence tools used: patient uses a pill box to manage medications          Specialty medication(s) dose(s) confirmed: Regimen is correct and unchanged.     Are there any concerns with adherence? No    Adherence counseling provided? Not needed    CLINICAL MANAGEMENT AND INTERVENTION      Clinical Benefit Assessment:    Do you feel the medicine is effective or helping your condition? Yes    Clinical Benefit counseling provided? Not needed    Adverse Effects Assessment:    Are you experiencing any side effects? No    Are you experiencing difficulty administering your medicine? No    Quality of Life Assessment:         How many days over the past month did your transplant  keep you from your normal activities? For example, brushing your teeth or getting up in the morning. 0    Have you discussed this with your provider? Not needed    Acute Infection Status:    Acute infections noted within Epic:  No active infections  Patient reported infection: None    Therapy Appropriateness:  Is therapy appropriate? Yes, therapy is appropriate and should be continued    DISEASE/MEDICATION-SPECIFIC INFORMATION      N/A    PATIENT SPECIFIC NEEDS     - Does the patient have any physical, cognitive, or cultural barriers? No    - Is the patient high risk? No    - Does the patient require a Care Management Plan? No     - Does the patient require physician intervention or other additional services (i.e. nutrition, smoking cessation, social work)? No      SHIPPING     Specialty Medication(s) to be Shipped:   Transplant: tacrolimus 1mg  and Prednisone 5mg     Other medication(s) to be shipped: No additional medications requested for fill at this time     Changes to insurance: No    Delivery Scheduled: Yes, Expected medication delivery date: 04/23/2021.     Medication will be delivered via UPS to the confirmed prescription address in [facility].  Patient gave me new prescription address, entered into wam: [address] [place] [address]    The patient will receive a drug information handout for each medication shipped and additional FDA Medication Guides as required.  Verified that patient has previously received a Conservation officer, historic buildings and a Surveyor, mining.    The patient or caregiver noted above participated in the development of this care plan and knows that they can request review of or adjustments to the care plan at any time.      All of the patient's questions and concerns have been addressed.    Thad Ranger   [facility] Specialty Pharmacist

## 2021-04-22 MED FILL — PREDNISONE 5 MG TABLET: ORAL | 30 days supply | Qty: 30 | Fill #4

## 2021-04-22 MED FILL — TACROLIMUS 1 MG CAPSULE, IMMEDIATE-RELEASE: ORAL | 30 days supply | Qty: 180 | Fill #2

## 2021-05-15 NOTE — Unmapped (Signed)
[facility] Pharm[facility] Refill Coordination Note    Specialty Medication(s) to be Shipped:   Transplant: tacrolimus 1mg  and Prednisone 5mg     Other medication(s) to be shipped: No additional medications requested for fill at this time     Johnny Evans, DOB: [birth year]  Phone: [phone] (home)       All above HIPAA information was verified with patient.     Was a Nurse, learning disability used for this call? No    Completed refill call assessment today to schedule patient's medication shipment from the [facility] [phone]).  All relevant notes have been reviewed.     Specialty medication(s) and dose(s) confirmed: Regimen is correct and unchanged.   Changes to medications: Johnny Evans reports no changes at this time.  Changes to insurance: No  New side effects reported not previously addressed with a pharmacist or physician: None reported  Questions for the pharmacist: No    Confirmed patient received a Conservation officer, historic buildings and a Surveyor, mining with first shipment. The patient will receive a drug information handout for each medication shipped and additional FDA Medication Guides as required.       DISEASE/MEDICATION-SPECIFIC INFORMATION        N/A    SPECIALTY MEDICATION ADHERENCE     Medication Adherence    Patient reported X missed doses in the last month: 0  Specialty Medication: Prednisone 5mg   Patient is on additional specialty medications: Yes  Additional Specialty Medications: Tacrolimus 1mg   Patient Reported Additional Medication X Missed Doses in the Last Month: 0  Patient is on more than two specialty medications: No  Adherence tools used: patient uses a pill box to manage medications              Were doses missed due to medication being on hold? No    Prednisone 5 mg: 7 days of medicine on hand   Tacrolimus 1 mg: 7 days of medicine on hand       REFERRAL TO PHARMACIST     Referral to the pharmacist: Not needed      [facility]     Shipping address confirmed in Epic.     Delivery Scheduled: Yes, Expected medication delivery date: 05/19/21.     Medication will be delivered via UPS to the prescription address in Epic WAM.    Johnny Evans [facility] Specialty Technician

## 2021-05-18 MED FILL — PREDNISONE 5 MG TABLET: ORAL | 30 days supply | Qty: 30 | Fill #5

## 2021-05-18 MED FILL — TACROLIMUS 1 MG CAPSULE, IMMEDIATE-RELEASE: ORAL | 30 days supply | Qty: 180 | Fill #3

## 2021-06-05 NOTE — Unmapped (Signed)
Patient has requested a medication refill via EPIC

## 2021-06-08 NOTE — Unmapped (Signed)
[facility] Refill Coordination Note    Specialty Medication(s) to be Shipped:   Transplant: tacrolimus 1mg  and Prednisone 5mg     Other medication(s) to be shipped: No additional medications requested for fill at this time     Johnny Evans, DOB: 07/25/1976  Phone: [phone] (home)       All above HIPAA information was verified with patient.     Was a Nurse, learning disability used for this call? No    Completed refill call assessment today to schedule patient's medication shipment from the [facility] [phone]).  All relevant notes have been reviewed.     Specialty medication(s) and dose(s) confirmed: Regimen is correct and unchanged.   Changes to medications: Johnny Evans reports no changes at this time.  Changes to insurance: No  New side effects reported not previously addressed with a pharmacist or physician: None reported  Questions for the pharmacist: No    Confirmed patient received a Conservation officer, historic buildings and a Surveyor, mining with first shipment. The patient will receive a drug information handout for each medication shipped and additional FDA Medication Guides as required.       DISEASE/MEDICATION-SPECIFIC INFORMATION        N/A    SPECIALTY MEDICATION ADHERENCE     Medication Adherence    Patient reported X missed doses in the last month: 0  Specialty Medication: Prednisone 5mg   Patient is on additional specialty medications: Yes  Additional Specialty Medications: Tacrolimus 1mg   Patient Reported Additional Medication X Missed Doses in the Last Month: 0  Patient is on more than two specialty medications: No  Adherence tools used: patient uses a pill box to manage medications        Were doses missed due to medication being on hold? No    Prednisone 5 mg: 12 days of medicine on hand   Tacrolimus 1 mg: 12 days of medicine on hand     REFERRAL TO PHARMACIST     Referral to the pharmacist: Not needed      [facility]     Shipping address confirmed in Epic.     Delivery Scheduled: Yes, Expected medication delivery date: 06/18/2021.     Medication will be delivered via UPS to the prescription address in Epic WAM.    [name] [facility] Specialty Technician

## 2021-06-17 MED FILL — TACROLIMUS 1 MG CAPSULE, IMMEDIATE-RELEASE: ORAL | 30 days supply | Qty: 180 | Fill #4

## 2021-06-17 MED FILL — PREDNISONE 5 MG TABLET: ORAL | 30 days supply | Qty: 30 | Fill #6

## 2021-07-16 NOTE — Unmapped (Signed)
The [facility] has made a second and final attempt to reach this patient to refill the following medication: prednisone and tacrolimus.      We have left voicemails on the following phone numbers: [phone].    Dates contacted: 10/20 & 10/27  Last scheduled delivery: 06/18/2021    The patient may be at risk of non-compliance with this medication. The patient should call the [facility] at [phone]  Option 4, then Option 2 (all other specialty patients) to refill medication.    Oretha Milch   [facility] Specialty Technician

## 2021-07-20 NOTE — Unmapped (Signed)
[facility] Refill Coordination Note    Specialty Medication(s) to be Shipped:   Transplant: tacrolimus 1mg  and Prednisone 5mg     Other medication(s) to be shipped: No additional medications requested for fill at this time     Johnny Evans, DOB: 12-Apr-1976  Phone: [phone] (home)       All above HIPAA information was verified with patient.     Was a Nurse, learning disability used for this call? No    Completed refill call assessment today to schedule patient's medication shipment from the [facility] [phone]).  All relevant notes have been reviewed.     Specialty medication(s) and dose(s) confirmed: Regimen is correct and unchanged.   Changes to medications: Johnny Evans reports no changes at this time.  Changes to insurance: No  New side effects reported not previously addressed with a pharmacist or physician: None reported  Questions for the pharmacist: No    Confirmed patient received a Conservation officer, historic buildings and a Surveyor, mining with first shipment. The patient will receive a drug information handout for each medication shipped and additional FDA Medication Guides as required.       DISEASE/MEDICATION-SPECIFIC INFORMATION        N/A    SPECIALTY MEDICATION ADHERENCE     Medication Adherence    Patient reported X missed doses in the last month: 0  Specialty Medication: Prednisone 5mg   Patient is on additional specialty medications: Yes  Additional Specialty Medications: Tacrolimus 1mg   Patient Reported Additional Medication X Missed Doses in the Last Month: 0  Patient is on more than two specialty medications: No  Adherence tools used: patient uses a pill box to manage medications              Were doses missed due to medication being on hold? No    Prednisone 5 mg: 7 days of medicine on hand   Tacrolimus 1 mg: 7 days of medicine on hand       REFERRAL TO PHARMACIST     Referral to the pharmacist: Not needed      [facility]     Shipping address confirmed in Epic.     Delivery Scheduled: Yes, Expected medication delivery date: 07/23/21.     Medication will be delivered via UPS to the prescription address in Epic WAM.    Tera Helper   [facility] Specialty Pharmacist

## 2021-07-22 MED FILL — TACROLIMUS 1 MG CAPSULE, IMMEDIATE-RELEASE: ORAL | 30 days supply | Qty: 180 | Fill #5

## 2021-07-22 MED FILL — PREDNISONE 5 MG TABLET: ORAL | 30 days supply | Qty: 30 | Fill #7

## 2021-07-22 NOTE — Unmapped (Signed)
Complex Case Management  SUMMARY NOTE    Attempted to contact pt today at Home number to introduce Complex Case Management services. Left message to return call.; 1st attempt    Discuss at next visit: Introduction to Complex Case Management    Lindajo RoyalAmy Kathey Simer, Care Coordinator    Complex Case Management   Phone: [phone]

## 2021-07-27 DIAGNOSIS — R82998 Other abnormal findings in urine: Principal | ICD-10-CM

## 2021-07-27 DIAGNOSIS — Z940 Kidney transplant status: Principal | ICD-10-CM

## 2021-07-27 DIAGNOSIS — R7309 Other abnormal glucose: Principal | ICD-10-CM

## 2021-07-31 ENCOUNTER — Ambulatory Visit: Admit: 2021-07-31

## 2021-07-31 ENCOUNTER — Ambulatory Visit: Admit: 2021-07-31 | Attending: Nephrology | Primary: Nephrology

## 2021-08-07 NOTE — Unmapped (Signed)
Complex Case Management  SUMMARY NOTE    Attempted to contact pt today at Home number to introduce Complex Case Management services. Left message to return call.; 2nd attempt, letter sent.    Discuss at next visit: Introduction to Complex Case Management     Cashawn Yanko, Care Coordinator    Complex Case Management   Phone: [phone]

## 2021-08-17 DIAGNOSIS — R82998 Other abnormal findings in urine: Principal | ICD-10-CM

## 2021-08-17 DIAGNOSIS — Z940 Kidney transplant status: Principal | ICD-10-CM

## 2021-08-17 DIAGNOSIS — R7989 Other specified abnormal findings of blood chemistry: Principal | ICD-10-CM

## 2021-08-18 DIAGNOSIS — F32A Depression, unspecified depression type: Principal | ICD-10-CM

## 2021-08-18 DIAGNOSIS — Z940 Kidney transplant status: Principal | ICD-10-CM

## 2021-08-18 DIAGNOSIS — I10 Essential (primary) hypertension: Principal | ICD-10-CM

## 2021-08-18 DIAGNOSIS — E559 Vitamin D deficiency, unspecified: Principal | ICD-10-CM

## 2021-08-18 DIAGNOSIS — Z79899 Other long term (current) drug therapy: Principal | ICD-10-CM

## 2021-08-18 MED ORDER — SERTRALINE 100 MG TABLET
ORAL_TABLET | 4 refills | 0 days
Start: 2021-08-18 — End: ?

## 2021-08-24 NOTE — Unmapped (Signed)
[facility] Refill Coordination Note    Specialty Medication(s) to be Shipped:   Transplant: tacrolimus 1mg  and Prednisone 5mg     Other medication(s) to be shipped: No additional medications requested for fill at this time     Johnny Evans, DOB: 1976/08/07  Phone: [phone] (home)       All above HIPAA information was verified with patient.     Was a Nurse, learning disability used for this call? No    Completed refill call assessment today to schedule patient's medication shipment from the [facility] [phone]).  All relevant notes have been reviewed.     Specialty medication(s) and dose(s) confirmed: Regimen is correct and unchanged.   Changes to medications: Irwin reports no changes at this time.  Changes to insurance: No  New side effects reported not previously addressed with a pharmacist or physician: None reported  Questions for the pharmacist: No    Confirmed patient received a Conservation officer, historic buildings and a Surveyor, mining with first shipment. The patient will receive a drug information handout for each medication shipped and additional FDA Medication Guides as required.       DISEASE/MEDICATION-SPECIFIC INFORMATION        N/A    SPECIALTY MEDICATION ADHERENCE     Medication Adherence    Patient reported X missed doses in the last month: 0  Specialty Medication: predniSONE  Patient is on additional specialty medications: Yes  Additional Specialty Medications: tacrolimus 1 MG    Patient Reported Additional Medication X Missed Doses in the Last Month: 0  Patient is on more than two specialty medications: No  Any gaps in refill history greater than 2 weeks in the last 3 months: no  Demonstrates understanding of importance of adherence: yes  Informant: patient  Adherence tools used: patient uses a pill box to manage medications              Were doses missed due to medication being on hold? No    Prednisone 5mg : Patient has 7 days of medication on hand  Tacrolimus 1mg : Patient has 7 days of medication on hand    REFERRAL TO PHARMACIST     Referral to the pharmacist: Not needed      [facility]     Shipping address confirmed in Epic.     Delivery Scheduled: Yes, Expected medication delivery date: 12/9.     Medication will be delivered via UPS to the prescription address in Epic WAM.    Olga Millers   [facility] Specialty Technician

## 2021-08-27 MED FILL — PREDNISONE 5 MG TABLET: ORAL | 30 days supply | Qty: 30 | Fill #8

## 2021-08-27 MED FILL — TACROLIMUS 1 MG CAPSULE, IMMEDIATE-RELEASE: ORAL | 30 days supply | Qty: 180 | Fill #6

## 2021-09-25 MED FILL — TACROLIMUS 1 MG CAPSULE, IMMEDIATE-RELEASE: ORAL | 7 days supply | Qty: 42 | Fill #7

## 2021-09-25 MED FILL — PREDNISONE 5 MG TABLET: ORAL | 30 days supply | Qty: 30 | Fill #9

## 2021-09-25 NOTE — Unmapped (Signed)
[facility] Clinical Assessment & Refill Coordination Note    Johnny Evans, DOB: 06-Sep-1976  Phone: [phone] (home)     All above HIPAA information was verified with patient.     Was a Nurse, learning disability used for this call? No    Specialty Medication(s):   Transplant: tacrolimus 1mg      Current Outpatient Medications   Medication Sig Dispense Refill   ??? aspirin (ECOTRIN) 81 MG tablet Take 81 mg by mouth daily.     ??? buPROPion (WELLBUTRIN XL) 150 MG 24 hr tablet Take 2 tablets (300 mg total) by mouth every morning. 60 tablet 11   ??? cholecalciferol, vitamin D3, (VITAMIN D3) 25 mcg (1,000 unit) capsule Take 2 capsules (2,000 Units total) by mouth daily.     ??? OMEGA-3/DHA/EPA/FISH OIL (FISH OIL-OMEGA-3 FATTY ACIDS) 300-1,000 mg capsule Take 2 capsules (2 g total) by mouth daily. 90 capsule 0   ??? predniSONE (DELTASONE) 5 MG tablet Take 1 tablet (5 mg total) by mouth daily. 30 tablet 11   ??? sertraline (ZOLOFT) 100 MG tablet TAKE 1 TABLET BY MOUTH EVERY DAY 90 tablet 3   ??? sertraline (ZOLOFT) 100 MG tablet TAKE 1 TABLET BY MOUTH EVERY DAY 30 tablet 6   ??? sildenafiL (VIAGRA) 50 MG tablet Take 1 tablet (50 mg total) by mouth daily as needed for erectile dysfunction. 30 tablet 2   ??? tacrolimus (PROGRAF) 1 MG capsule Take 3 capsules (3 mg total) by mouth two (2) times a day. 180 capsule 11     No current facility-administered medications for this visit.        Changes to medications: Johnny Evans reports no changes at this time.    Allergies   Allergen Reactions   ??? Ace Inhibitors      GI upset       Changes to allergies: No    SPECIALTY MEDICATION ADHERENCE     Tacrolimus 1 mg: 4 days of medicine on hand       Medication Adherence    Specialty Medication: Prednisone 5mg   Patient is on additional specialty medications: Yes  Additional Specialty Medications: Tacrolimus 1mg   Patient is on more than two specialty medications: No  Adherence tools used: patient uses a pill box to manage medications Specialty medication(s) dose(s) confirmed: Regimen is correct and unchanged.     Are there any concerns with adherence? No    Adherence counseling provided? Not needed    CLINICAL MANAGEMENT AND INTERVENTION      Clinical Benefit Assessment:    Do you feel the medicine is effective or helping your condition? Yes    Clinical Benefit counseling provided? Not needed    Adverse Effects Assessment:    Are you experiencing any side effects? No    Are you experiencing difficulty administering your medicine? No    Quality of Life Assessment:         How many days over the past month did your kidney transplant  keep you from your normal activities? For example, brushing your teeth or getting up in the morning. 0    Have you discussed this with your provider? Not needed    Acute Infection Status:    Acute infections noted within Epic:  No active infections  Patient reported infection: None    Therapy Appropriateness:    Is therapy appropriate and patient progressing towards therapeutic goals? Yes, therapy is appropriate and should be continued    DISEASE/MEDICATION-SPECIFIC INFORMATION  N/A    PATIENT SPECIFIC NEEDS     - Does the patient have any physical, cognitive, or cultural barriers? No    - Is the patient high risk? No    - Does the patient require a Care Management Plan? No     SOCIAL DETERMINANTS OF HEALTH     At the [facility], we have learned that life circumstances - like trouble affording food, housing, utilities, or transportation can affect the health of many of our patients.   That is why we wanted to ask: are you currently experiencing any life circumstances that are negatively impacting your health and/or quality of life? No    Social Determinants of Health     Food Insecurity: Not on file   Tobacco Use: Medium Risk   ??? Smoking Tobacco Use: Former   ??? Smokeless Tobacco Use: Never   ??? Passive Exposure: Not on file   Transportation Needs: Not on file   Alcohol Use: Not on file Housing/Utilities: Not on file   Substance Use: Not on file   Financial Resource Strain: Not on file   Physical Activity: Not on file   Health Literacy: Not on file   Stress: Not on file   Intimate Partner Violence: Not on file   Depression: Not on file   Social Connections: Not on file       Would you be willing to receive help with any of the needs that you have identified today? Not applicable       SHIPPING     Specialty Medication(s) to be Shipped:   Transplant: tacrolimus 1mg     Other medication(s) to be shipped: No additional medications requested for fill at this time     Changes to insurance: No    Delivery Scheduled: Yes, Expected medication delivery date: 09/28/21.     Medication will be delivered via UPS to the confirmed prescription address in [facility].    The patient will receive a drug information handout for each medication shipped and additional FDA Medication Guides as required.  Verified that patient has previously received a Conservation officer, historic buildings and a Surveyor, mining.    The patient or caregiver noted above participated in the development of this care plan and knows that they can request review of or adjustments to the care plan at any time.      All of the patient's questions and concerns have been addressed.    Tera Helper   [facility] Specialty Pharmacist

## 2021-10-08 NOTE — Unmapped (Signed)
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 CSW follows up to assess financial questions/planning.    PREFERRED LANGUAGE: English    INTERPRETER UTILIZED: N/A    TRANSPLANT DATE:   05/23/2014 (Kidney)    POST TXP RN COORDINATOR:   Laury Deep [phone]; [phone]    SUMMARY:  Pt reached out to this CSW last week via email requesting financial assistance with either his car or back rent payment.  Pt well known to this provider from prior post care and financial assistance requests.    Pt is s/p 05/23/2014 from kidney transplant.  Currently, pt is physically separated from his spouse (and has been for several years now) and lives alone in [place], [place].  Pt has no insurance and has been unable to qualify for Medicaid as he no longer meets the criteria for disability.   Pt has made some past efforts to apply for [place] PAP and FA, but was unable to complete those applications due to difficulty obtaining needed documentation (at my last f/up).     Since COVID, pt has worked in a series of temporary, short term jobs.  My clinical assessment would be that he is well below the 250% FPG guidelines, but I have no data to confirm that.  I have talked with him extensively and he has no other proof of income to provide (e.g. tax return, other paystubs).     Over COVID, pt found himself in significant debt from his rent as he had lost his job and was unable to find work.  He sought out assistance from [facility]  multiple times due to problems getting/affording his medications, lack of insurance coverage, housing instability and backpay for his car.  From my records, the below is a summary of assistance offered to date:     07/2020--$772 in back car payment from [facility]  12/2020--$50/gas card from [place]  03/2021--$858 in back car payment from [facility]     It is my current understanding that he has since been sent to collections for additional back car payments, despite [employer] catching him up both in 07/2020 and again in 03/2021.  He is living in a new location and has now accrued $2100 in back rent there (~3 months; $700/month).  This CSW has spoken w/ his landlord who would like to work with pt and avoid eviction if possible.     To his credit, pt does appear to be trying and continues to try and find stable employment w/ benefits.  At this time, he is pursuing an application for the newly expanded Medicare B (anti-jection med coverage) for post kidney txp patients.  Approval is pending.       Pt initially called this CSW asking for additional assistance w/ his car payment, as it was almost paid up.  This CSW indicated that w/ the past assistance that had been offered, I was uncertain that additional assistance could be available.  Pt now asking for assistance with his back rent payment.  As a transplant patient, he would need reliable transportation and safe/stable housing in order to meet his basic needs.    Request submitted to [employer]ra [employer] for $700/rent payment.        Lowella Petties, LCSW, CCTSW  Transplant Case Manager  [facility]  10/08/2021

## 2021-10-09 NOTE — Unmapped (Signed)
KIDNEY POST-TRANSPLANT ASSESSMENT   Clinical Social Worker Progress Note    Name:Jaser Valentina Gu  Date of Birth:May 24, 1976  ZOX:[id number]    REFERRAL INFORMATION:    [name] is s/p transplant for kidney transplantation . CSW follows up to assess financial questions/planning.    PREFERRED LANGUAGE: English    INTERPRETER UTILIZED: N/A    TRANSPLANT DATE:   05/23/2014 (Kidney)    POST TXP RN COORDINATOR:   Laury Deep [phone]; [phone]    SUMMARY:  Pt approved for 1 month rent assistance ($700) via [employer].  Per discussion w/ manager, very unlikely that we will be able to offer additional financial assistance for this fiscal year unless some emergent situation were to arise.      Informed pt and landlord of approval.  Pending check processing.    Updated summary of financial assistance to date:  07/2020--$772 in back car payment from [facility]  10/2020--$450/?? from [employer]  04/4695--$29/[employer] card from [employer]ra [employer]  03/2021--$858 in back car payment from [facility]  09/2021--$700 rent from [facility]      Lowella Petties, [place]  Transplant Case Manager  [facility]  10/09/2021

## 2021-10-22 ENCOUNTER — Ambulatory Visit: Admit: 2021-10-22 | Discharge: 2021-10-22

## 2021-10-22 DIAGNOSIS — Z940 Kidney transplant status: Principal | ICD-10-CM

## 2021-10-22 LAB — TACROLIMUS LEVEL, TROUGH: TACROLIMUS, TROUGH: <1 ng/mL — ABNORMAL LOW (ref 5.0–15.0)

## 2021-10-22 LAB — COMPREHENSIVE METABOLIC PANEL
ALBUMIN: 4.2 g/dL (ref 3.4–5.0)
ALKALINE PHOSPHATASE: 83 U/L (ref 46–116)
ALT (SGPT): 9 U/L — ABNORMAL LOW (ref 10–49)
ANION GAP: 7 mmol/L (ref 5–14)
AST (SGOT): 10 U/L (ref <=34)
BILIRUBIN TOTAL: 0.4 mg/dL (ref 0.3–1.2)
BLOOD UREA NITROGEN: 14 mg/dL (ref 9–23)
BUN / CREAT RATIO: 16
CALCIUM: 10.2 mg/dL (ref 8.7–10.4)
CHLORIDE: 108 mmol/L — ABNORMAL HIGH (ref 98–107)
CO2: 27.7 mmol/L (ref 20.0–31.0)
CREATININE: 0.88 mg/dL
EGFR CKD-EPI (2021) MALE: >90 mL/min/{1.73_m2} (ref >=60)
GLUCOSE RANDOM: 94 mg/dL (ref 70–179)
POTASSIUM: 3.9 mmol/L (ref 3.4–4.8)
PROTEIN TOTAL: 7 g/dL (ref 5.7–8.2)
SODIUM: 143 mmol/L (ref 135–145)

## 2021-10-22 LAB — LIPID PANEL
CHOLESTEROL/HDL RATIO SCREEN: 4.7 — ABNORMAL HIGH (ref 1.0–4.5)
CHOLESTEROL: 226 mg/dL — ABNORMAL HIGH (ref <=200)
HDL CHOLESTEROL: 48 mg/dL (ref 40–60)
LDL CHOLESTEROL CALCULATED: 156 mg/dL — ABNORMAL HIGH (ref 40–99)
NON-HDL CHOLESTEROL: 178 mg/dL — ABNORMAL HIGH (ref 70–130)
TRIGLYCERIDES: 111 mg/dL (ref 0–150)
VLDL CHOLESTEROL CAL: 22.2 mg/dL (ref 11–50)

## 2021-10-22 LAB — CBC W/ AUTO DIFF
BASOPHILS ABSOLUTE COUNT: 0 10*9/L (ref 0.0–0.1)
BASOPHILS RELATIVE PERCENT: 0.5 %
EOSINOPHILS ABSOLUTE COUNT: 0.5 10*9/L (ref 0.0–0.5)
EOSINOPHILS RELATIVE PERCENT: 5.4 %
HEMATOCRIT: 44.7 % (ref 39.0–48.0)
HEMOGLOBIN: 15.2 g/dL (ref 12.9–16.5)
LYMPHOCYTES ABSOLUTE COUNT: 1.5 10*9/L (ref 1.1–3.6)
LYMPHOCYTES RELATIVE PERCENT: 17.4 %
MEAN CORPUSCULAR HEMOGLOBIN CONC: 34 g/dL (ref 32.0–36.0)
MEAN CORPUSCULAR HEMOGLOBIN: 30.9 pg (ref 25.9–32.4)
MEAN CORPUSCULAR VOLUME: 91 fL (ref 77.6–95.7)
MEAN PLATELET VOLUME: 8.4 fL (ref 6.8–10.7)
MONOCYTES ABSOLUTE COUNT: 0.7 10*9/L (ref 0.3–0.8)
MONOCYTES RELATIVE PERCENT: 7.7 %
NEUTROPHILS ABSOLUTE COUNT: 6.1 10*9/L (ref 1.8–7.8)
NEUTROPHILS RELATIVE PERCENT: 69 %
PLATELET COUNT: 252 10*9/L (ref 150–450)
RED BLOOD CELL COUNT: 4.92 10*12/L (ref 4.26–5.60)
RED CELL DISTRIBUTION WIDTH: 14.8 % (ref 12.2–15.2)
WBC ADJUSTED: 8.8 10*9/L (ref 3.6–11.2)

## 2021-10-22 LAB — HEMOGLOBIN A1C
ESTIMATED AVERAGE GLUCOSE: 111 mg/dL
HEMOGLOBIN A1C: 5.5 % (ref 4.8–5.6)

## 2021-10-22 LAB — BILIRUBIN, DIRECT: BILIRUBIN DIRECT: 0.1 mg/dL (ref 0.00–0.30)

## 2021-10-22 LAB — PARATHYROID HORMONE (PTH): PARATHYROID HORMONE INTACT: 61.7 pg/mL (ref 18.4–80.1)

## 2021-10-22 LAB — CMV DNA, QUANTITATIVE, PCR: CMV VIRAL LD: NOT DETECTED

## 2021-10-22 LAB — PHOSPHORUS: PHOSPHORUS: 3.2 mg/dL (ref 2.4–5.1)

## 2021-10-22 LAB — MAGNESIUM: MAGNESIUM: 1.8 mg/dL (ref 1.6–2.6)

## 2021-10-23 DIAGNOSIS — Z940 Kidney transplant status: Principal | ICD-10-CM

## 2021-10-23 MED ORDER — TACROLIMUS XR 1 MG TABLET,EXTENDED RELEASE 24 HR
ORAL_TABLET | Freq: Every day | ORAL | 11 refills | 30 days | Status: CP
Start: 2021-10-23 — End: ?
  Filled 2021-10-23: qty 150, 30d supply, fill #0

## 2021-10-23 NOTE — Unmapped (Signed)
[facility] Specialty Medication Onboarding    Specialty Medication: Envarsus XR 1mg  tablet  Prior Authorization: No insurance  Financial Assistance: Free Trial Card  Final Copay/Day Supply: $0 / 30 days    Insurance Restrictions: Yes - max 1 month supply     Notes to Pharmacist: Free Trial Card    The triage team has completed the benefits investigation and has determined that the patient is able to fill this medication at [facility]. Please contact the patient to complete the onboarding or follow up with the prescribing physician as needed.

## 2021-10-23 NOTE — Unmapped (Signed)
[facility] Clinical Assessment & Refill Coordination Note    [name], DOB: 1976/01/21  Phone: [phone] (home)     All above HIPAA information was verified with patient.     Was a Nurse, learning disability used for this call? No    Specialty Medication(s):   Transplant: Envarsus 1mg      Current Outpatient Medications   Medication Sig Dispense Refill   ??? aspirin (ECOTRIN) 81 MG tablet Take 81 mg by mouth daily.     ??? buPROPion (WELLBUTRIN XL) 150 MG 24 hr tablet Take 2 tablets (300 mg total) by mouth every morning. 60 tablet 11   ??? cholecalciferol, vitamin D3, (VITAMIN D3) 25 mcg (1,000 unit) capsule Take 2 capsules (2,000 Units total) by mouth daily.     ??? OMEGA-3/DHA/EPA/FISH OIL (FISH OIL-OMEGA-3 FATTY ACIDS) 300-1,000 mg capsule Take 2 capsules (2 g total) by mouth daily. 90 capsule 0   ??? predniSONE (DELTASONE) 5 MG tablet Take 1 tablet (5 mg total) by mouth daily. 30 tablet 11   ??? sertraline (ZOLOFT) 100 MG tablet TAKE 1 TABLET BY MOUTH EVERY DAY 90 tablet 3   ??? sertraline (ZOLOFT) 100 MG tablet TAKE 1 TABLET BY MOUTH EVERY DAY 30 tablet 6   ??? sildenafiL (VIAGRA) 50 MG tablet Take 1 tablet (50 mg total) by mouth daily as needed for erectile dysfunction. 30 tablet 2   ??? tacrolimus (ENVARSUS XR) 1 mg Tb24 extended release tablet Take 5 tablets (5 mg total) by mouth daily. 150 tablet 11     No current facility-administered medications for this visit.        Changes to medications: Elion reports no changes at this time.    Allergies   Allergen Reactions   ??? Ace Inhibitors      GI upset       Changes to allergies: No    SPECIALTY MEDICATION ADHERENCE     envarsus 1mg   : 0 days of medicine on hand   Tacrolimus 1mg   : 0 days of medicine on hand   Prednisone 5mg  :  15 days of medicine on hand       Medication Adherence    Patient reported X missed doses in the last month: 0  Specialty Medication: prednisone 5mg   Patient is on additional specialty medications: Yes  Additional Specialty Medications: Tacrolimus 1mg   Patient Reported Additional Medication X Missed Doses in the Last Month: 0  Patient is on more than two specialty medications: Yes  Specialty Medication: envarsus 1mg   Patient Reported Additional Medication X Missed Doses in the Last Month: 0  Adherence tools used: patient uses a pill box to manage medications          Specialty medication(s) dose(s) confirmed: Regimen is correct and unchanged.     Are there any concerns with adherence? No    Adherence counseling provided? Not needed    CLINICAL MANAGEMENT AND INTERVENTION      Clinical Benefit Assessment:    Do you feel the medicine is effective or helping your condition? Yes    Clinical Benefit counseling provided? Not needed    Adverse Effects Assessment:    Are you experiencing any side effects? No    Are you experiencing difficulty administering your medicine? No    Quality of Life Assessment:    How many days over the past month did your transplant  keep you from your normal activities? For example, brushing your teeth or getting up in the morning.  0    Have you discussed this with your provider? Not needed    Acute Infection Status:    Acute infections noted within Epic:  No active infections  Patient reported infection: None    Therapy Appropriateness:    Is therapy appropriate and patient progressing towards therapeutic goals? Yes, therapy is appropriate and should be continued    DISEASE/MEDICATION-SPECIFIC INFORMATION      N/A    PATIENT SPECIFIC NEEDS     - Does the patient have any physical, cognitive, or cultural barriers? No    - Is the patient high risk? No    - Does the patient require a Care Management Plan? No       SHIPPING     Specialty Medication(s) to be Shipped:   Transplant: Envarsus 1mg     Other medication(s) to be shipped: No additional medications requested for fill at this time   Call set up for next week for other meds - patient hopes to have insurance resolved by that time     Changes to insurance: No    Delivery Scheduled: Yes, Expected medication delivery date: 10/24/2021.     Medication will be delivered via saturday ups delivery (verified with Morrie Sheldon on production team that address is ups sat range) to the confirmed prescription address in [facility].    The patient will receive a drug information handout for each medication shipped and additional FDA Medication Guides as required.  Verified that patient has previously received a Conservation officer, historic buildings and a Surveyor, mining.    The patient or caregiver noted above participated in the development of this care plan and knows that they can request review of or adjustments to the care plan at any time.      All of the patient's questions and concerns have been addressed.    Thad Ranger   [facility] Specialty Pharmacist

## 2021-10-23 NOTE — Unmapped (Signed)
2/3: patient is aware envarsus is to replace tacrolimus (prograf) -ef    The following medication is onboarded in this note:  1. [employer]    [facility]   Patient Onboarding/Medication Counseling    [name] is a 46 y.o. male with kidney transplant who I am counseling today on initiation of therapy.  I am speaking to the patient.    Was a Nurse, learning disability used for this call? No    Verified patient's date of birth / HIPAA.    Specialty medication(s) to be sent: Transplant: Envarsus 1mg       Non-specialty medications/supplies to be sent: n/a      Medications not needed at this time: n/a         Envarsus (tacrolimus XR)    Medication & Administration     Dosage: take 5 tablets (5mg  total) by mouth daily     Administration:   ??? Take in the morning on empty stomach - 1 hour before or 2 hours after breakfast  ??? Take with drink of water  ??? Do not crush, break, or chew tablets    Adherence/Missed dose instructions:  ??? Take a missed dose as soon as you think about it.  ??? If it has been more than 15 hours since missed dose, skip the missed dose and go back to your regular time  ??? Report all missed doses to transplant coordinator    Goals of Therapy     ??? To prevent organ rejection    Side Effects & Monitoring Parameters     ??? Common side effects  ??? Fatigue  ??? Headache  ??? Stuffy nose or sore throat  ??? Nausea, vomiting, stomach pain, diarrhea, constipation  ??? Heartburn  ??? Back or joint pain  ??? Increased risk of infection    ??? The following side effects should be reported to the provider:  ??? Allergic reaction  ??? Kidney issues (change in quantity or urine passed, blood in urine, or weight gain)  ??? High blood pressure (dizziness, change in eyesight, headache)  ??? Electrolyte issues (change in mood, confusion, muscle pain, or weakness)  ??? Abnormal breathing  ??? Shakiness  ??? Unexplained bleeding or bruising (gums bleeding, blood in urine, nosebleeds, any abnormal bleeding)  ??? Signs of infection    ??? Monitoring Parameters  ??? Renal function  ??? Liver function  ??? Glucose levels  ??? Blood pressure  ??? Tacrolimus trough levels      Contraindications, Warnings, & Precautions     ??? Black Box Warning: Infections - immunosuppressant agents increase the risk of infection that may lead to hospitalization or death  ??? Black Box Warning: Malignancy - immunosuppressant agents may be associated with the development of malignancies that may lead to hospitalization or death.  Limit or avoid sun and ultraviolet light exposure, use appropriate sun protection  ??? Myocardial hypertrophy -avoid use in patients with congenital long QT syndrome  ??? Diabetes mellitus - the risk for new-onset diabetes and insulin-dependent post-transplant diabetes mellitus is increased with tacrolimus use after transplantation  ??? GI perforation  ??? Hyperkalemia  ??? Hypertension  ??? Nephrotoxicity  ??? Neurotoxicity    Drug/Food Interactions     ??? Medication list reviewed in Epic. no interactions noted that clinic is not already monitoring.   ??? Due to amount and severity of drug interactions, report ALL medications starts, discontinuations, and changes to transplant coordinator prior to making the change  ??? Avoid alcohol  ??? Avoid grapefruit or  grapefruit juice  ??? Avoid live vaccines    Storage, Handling Precautions, & Disposal     ??? Store at room temperature  ??? Keep away from children and pets      Current Medications (including OTC/herbals), Comorbidities and Allergies     Current Outpatient Medications   Medication Sig Dispense Refill   ??? aspirin (ECOTRIN) 81 MG tablet Take 81 mg by mouth daily.     ??? buPROPion (WELLBUTRIN XL) 150 MG 24 hr tablet Take 2 tablets (300 mg total) by mouth every morning. 60 tablet 11   ??? cholecalciferol, vitamin D3, (VITAMIN D3) 25 mcg (1,000 unit) capsule Take 2 capsules (2,000 Units total) by mouth daily.     ??? OMEGA-3/DHA/EPA/FISH OIL (FISH OIL-OMEGA-3 FATTY ACIDS) 300-1,000 mg capsule Take 2 capsules (2 g total) by mouth daily. 90 capsule 0 ??? predniSONE (DELTASONE) 5 MG tablet Take 1 tablet (5 mg total) by mouth daily. 30 tablet 11   ??? sertraline (ZOLOFT) 100 MG tablet TAKE 1 TABLET BY MOUTH EVERY DAY 90 tablet 3   ??? sertraline (ZOLOFT) 100 MG tablet TAKE 1 TABLET BY MOUTH EVERY DAY 30 tablet 6   ??? sildenafiL (VIAGRA) 50 MG tablet Take 1 tablet (50 mg total) by mouth daily as needed for erectile dysfunction. 30 tablet 2   ??? tacrolimus (ENVARSUS XR) 1 mg Tb24 extended release tablet Take 5 tablets (5 mg total) by mouth daily. 150 tablet 11     No current facility-administered medications for this visit.       Allergies   Allergen Reactions   ??? Ace Inhibitors      GI upset       Patient Active Problem List   Diagnosis   ??? Asthma without status asthmaticus   ??? Gout   ??? Hypertension   ??? Kidney transplant 05/23/2014   ??? Paroxysmal atrial fibrillation (CMS-HCC)   ??? Immunosuppression (CMS-HCC)       Reviewed and up to date in Epic.    Appropriateness of Therapy     Acute infections noted within Epic:  No active infections  Patient reported infection: None    Is medication and dose appropriate based on diagnosis and infection status? Yes    Prescription has been clinically reviewed: Yes      Baseline Quality of Life Assessment      How many days over the past month did your transplant  keep you from your normal activities? For example, brushing your teeth or getting up in the morning. 0    Financial Information     Medication Assistance provided: Manufacturer Assistance in process    Anticipated copay of $0/30dson trial card reviewed with patient. Verified delivery address.    Delivery Information     Scheduled delivery date: 10/24/2021    Expected start date: as soon as medication arrives    Medication will be delivered via saturday ups (verified address is in sat ups range per Morrie Sheldon on production team) to the prescription address in [place].  This shipment will not require a signature.      Explained the services we provide at [facility] and that each month we would call to set up refills.  Stressed importance of returning phone calls so that we could ensure they receive their medications in time each month.  Informed patient that we should be setting up refills 7-10 days prior to when they will run out of medication.  A pharmacist will reach out to  perform a clinical assessment periodically.  Informed patient that a welcome packet, containing information about our pharmacy and other support services, a Notice of Privacy Practices, and a drug information handout will be sent.      The patient or caregiver noted above participated in the development of this care plan and knows that they can request review of or adjustments to the care plan at any time.      Patient or caregiver verbalized understanding of the above information as well as how to contact the pharmacy at [phone] option 4 with any questions/concerns.  The pharmacy is open Monday through Friday 8:30am-4:30pm.  A pharmacist is available 24/7 via pager to answer any clinical questions they may have.    Patient Specific Needs     - Does the patient have any physical, cognitive, or cultural barriers? No    - Does the patient have adequate living arrangements? (i.e. the ability to store and take their medication appropriately) Yes    - Did you identify any home environmental safety or security hazards? No    - Patient prefers to have medications discussed with  Patient     - Is the patient or caregiver able to read and understand education materials at a high school level or above? Yes    - Patient's primary language is  English     - Is the patient high risk? No    Thad Ranger  [facility] Specialty Pharmacist

## 2021-10-23 NOTE — Unmapped (Signed)
Called pt to review undetectable tac level and pt admitted he has been out of tacrolimus for a couple weeks.  He is working on Education officer, environmental and is waiting on finalizing coverage and wasn't able to afford a month supply.    Anticipate he could get free drug through free trial card for first fill then [facility] as, per [facility], he's not PAP eligible through [facility] at this time.  Called [facility], [facility], and [facility] and they do not have Envarsus in stock.  Will send Rx to [facility] pharmacy with plan for Monday delivery.    Pt aware and will await pharmacy call to schedule delivery.

## 2021-10-27 LAB — VITAMIN D 1,25 DIHYDROXY: VITAMIN D 1,25-DIHYDROXY: 39 pg/mL

## 2021-10-27 LAB — VITAMIN D 25 HYDROXY: VITAMIN D, TOTAL (25OH): 33.4 ng/mL (ref 20.0–80.0)

## 2021-10-27 NOTE — Unmapped (Signed)
Received call from patient.  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  Message sent to Dr Toni Arthurs to follow up.    Per Dr Toni Arthurs, paperwork has been faxed and email sent.  Patient called back and notified.

## 2021-10-27 NOTE — Unmapped (Signed)
[facility] transplant nephrology telemedicine visit    assessment and plan:  1. s/p kidney transplant 05/23/2014. baseline creatinine 1-1.2 mg/dl. no proteinuria. no donor specific hla ab detected '22.   2. immunosuppression. prednisone 5mg  daily; increased to 10mg  daily pending availability of tacrolimus. tacrolimus xl 5mg  daily prescribed with 30d trial card/anticipated medication delivery 2/4 vs monday 2/6; tacrolimus 24hr lvl 4-7 ng/ml. importance of transplant medication adherence to minimize risk of hla dsa sensitization and acute/chronic rejection with accelerated graft loss reviewed at length. [facility] pharmacy assistance program application strongly encouraged as bridge to medicare b expansion for transplant medication coverage.  3. hypertension. blood pressure goal < 130/80 mmhg  4. anxiety/depression. sertraline 100mg  daily.  5. preventive medicine. influenza '20/annual update encouraged. pcv13 pneumococcal '14. ppsv23 pneumococcal '16. pfizer covid-19 vaccine '22; bivalent booster encouraged. kidney ultrasound '20; annual follow up recommended.     history of present illness    mr. Johnny Evans is a 46 year old gentleman seen as telemedicine follow up appt post kidney transplant 05/23/2014. he feels ok. physical energy level and mood fair. he is having difficulty with access to transplant medications: he was previously receiving transplant immunosuppression medication through  pharmacy assistance program through 12/31 but has not yet renewed his application for 2023. he is in process of applying for medicare part b expansion transplant medication coverage with assistance of post transplant social worker. no tacrolimus x past 5d. no fever chills or sweats. no headache or lightheaded. no chest pain palpitations or shortness of breath. no lower extremity edema. appetite good. no abdominal pain no n/v/d. no dysuria hematuria or difficulty voiding. all other systems reviewed and negative x10 systems.    past medical hx:  1. 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 focal segmental glomerulosclerosis. alemtuzumab induction. baseline creatinine 1-1.2 mg/dl.  > kidney bx 12/15: +stage1 bk nephropathy. no acute/chr rejection.  2. hypertension  3. hyperlipidemia  4. echocardiogram '14: nl lv. lvef > 55%. diastolic lv dysfunction.  5. atrial fibrillation  6. gout  7. hx asthma/reactive airways disease    past surgical hx: left upper ext av fistula '10. kidney transplant '15.    allergies: ace inh    medications: tacrolimus 3mg  am/pm, prednisone 5mg  daily, sertraline 100mg  daily, aspirin 81mg  daily, omega3 fish oil 2g daily, vitamin d3 2,000u daily.    soc hx: married/separated x2 sons. former smoking hx; quit '12.    no physical exam d/t telemedicine.    labs 10/22/21: wbc8.8 hgb15.2 hct44.7 plts252. UX324 k3.9 cl108 bicarb28 bun14 cr0.9 glc94 ca10.2 mg1.8 phos3.2 albumin4.2 liver function panel nl. total cholesterol 226 tg 111 hdl 48 ldl 156. hgb a1c 5.5%. pth 61.7. cmv pcr not detected. tacrolimus < 1.0 ng/ml.    The 10-year ASCVD risk score (Arnett DK, et al., 2019) is: 4.2%    I spent 20 minutes on the phone with the patient on the date of service. I spent an additional 15 minutes on pre- and post-visit activities on the date of service.     The patient was physically located in [place] or a state in which I am permitted to provide care. The patient and/or parent/guardian understood that s/he may incur co-pays and cost sharing, and agreed to the telemedicine visit. The visit was reasonable and appropriate under the circumstances given the patient's presentation at the time.    The patient and/or parent/guardian has been advised of the potential risks and limitations of this mode of treatment (including, but not limited  to, the absence of in-person examination) and has agreed to be treated using telemedicine. The patient's/patient's family's questions regarding telemedicine have been answered.     If the visit was completed in an ambulatory setting, the patient and/or parent/guardian has also been advised to contact their provider???s office for worsening conditions, and seek emergency medical treatment and/or call 911 if the patient deems either necessary.

## 2021-10-28 LAB — HLA DS POST TRANSPLANT
ANTI-DONOR DRW #1 MFI: 510 MFI
ANTI-DONOR DRW #2 MFI: 203 MFI
ANTI-DONOR HLA-A #1 MFI: 0 MFI
ANTI-DONOR HLA-A #2 MFI: 4 MFI
ANTI-DONOR HLA-B #1 MFI: 0 MFI
ANTI-DONOR HLA-B #2 MFI: 0 MFI
ANTI-DONOR HLA-C #1 MFI: 0 MFI
ANTI-DONOR HLA-C #2 MFI: 0 MFI
ANTI-DONOR HLA-DP AG #1 MFI: 283 MFI
ANTI-DONOR HLA-DQB #1 MFI: 237 MFI
ANTI-DONOR HLA-DQB #2 MFI: 618 MFI
ANTI-DONOR HLA-DR #1 MFI: 202 MFI
ANTI-DONOR HLA-DR #2 MFI: 120 MFI

## 2021-10-28 LAB — FSAB CLASS 2 ANTIBODY SPECIFICITY: HLA CL2 AB RESULT: NEGATIVE

## 2021-10-28 LAB — FSAB CLASS 1 ANTIBODY SPECIFICITY: HLA CLASS 1 ANTIBODY RESULT: POSITIVE

## 2021-10-28 NOTE — Unmapped (Signed)
[facility] Refill Coordination Note    Specialty Medication(s) to be Shipped:   Transplant: Prednisone 5mg     Other medication(s) to be shipped: No additional medications requested for fill at this time     Johnny Evans, DOB: 22-Nov-1975  Phone: [phone] (home)       All above HIPAA information was verified with patient.     Was a Nurse, learning disability used for this call? No    Completed refill call assessment today to schedule patient's medication shipment from the [facility] [phone]).  All relevant notes have been reviewed.     Specialty medication(s) and dose(s) confirmed: Regimen is correct and unchanged.   Changes to medications: Johnny Evans reports no changes at this time.  Changes to insurance: No  New side effects reported not previously addressed with a pharmacist or physician: None reported  Questions for the pharmacist: No    Confirmed patient received a Conservation officer, historic buildings and a Surveyor, mining with first shipment. The patient will receive a drug information handout for each medication shipped and additional FDA Medication Guides as required.       DISEASE/MEDICATION-SPECIFIC INFORMATION        N/A    SPECIALTY MEDICATION ADHERENCE     Medication Adherence    Patient reported X missed doses in the last month: 0  Specialty Medication: prednisone 5mg   Patient is on additional specialty medications: No  Patient is on more than two specialty medications: No  Any gaps in refill history greater than 2 weeks in the last 3 months: no  Demonstrates understanding of importance of adherence: yes  Informant: patient  Reliability of informant: reliable  Provider-estimated medication adherence level: good  Patient is at risk for Non-Adherence: No  Reasons for non-adherence: no problems identified  Adherence tools used: patient uses a pill box to manage medications  Confirmed plan for next specialty medication refill: delivery by pharmacy  Refills needed for supportive medications: not needed          Refill Coordination    Has the Patients' Contact Information Changed: No  Is the Shipping Address Different: No         Were doses missed due to medication being on hold? No    prednisone 5 mg: 7 days of medicine on hand         REFERRAL TO PHARMACIST     Referral to the pharmacist: Not needed      [facility]     Shipping address confirmed in Epic.     Delivery Scheduled: Yes, Expected medication delivery date: 02/10.     Medication will be delivered via UPS to the prescription address in Epic WAM.    Johnny Evans   [facility] Specialty Technician

## 2021-10-29 MED FILL — PREDNISONE 5 MG TABLET: ORAL | 30 days supply | Qty: 30 | Fill #10

## 2021-11-13 NOTE — Unmapped (Signed)
[facility] Refill Coordination Note    Specialty Medication(s) to be Shipped:   Transplant: Envarsus 1mg     Other medication(s) to be shipped:    predniSONE    **PT has to prove to PAP that he not eligable for Medicare Part D. PT said he is working on that. Medicare Part D [phone].     Johnny Evans, DOB: [birth year]  Phone: [phone] (home)       All above HIPAA information was verified with patient.     Was a Nurse, learning disability used for this call? No    Completed refill call assessment today to schedule patient's medication shipment from the [facility] [phone]).  All relevant notes have been reviewed.     Specialty medication(s) and dose(s) confirmed: Regimen is correct and unchanged.   Changes to medications: Johnny Evans reports no changes at this time.  Changes to insurance: No  New side effects reported not previously addressed with a pharmacist or physician: None reported  Questions for the pharmacist: No    Confirmed patient received a Conservation officer, historic buildings and a Surveyor, mining with first shipment. The patient will receive a drug information handout for each medication shipped and additional FDA Medication Guides as required.       DISEASE/MEDICATION-SPECIFIC INFORMATION        N/A    SPECIALTY MEDICATION ADHERENCE     Medication Adherence    Patient reported X missed doses in the last month: 0  Specialty Medication: tacrolimus (ENVARSUS XR) 1 mg Tb24 extended release tablet  Patient is on additional specialty medications: Yes  Additional Specialty Medications: predniSONE (DELTASONE) 5 MG tablet  Patient Reported Additional Medication X Missed Doses in the Last Month: 0  Patient is on more than two specialty medications: No  Adherence tools used: patient uses a pill box to manage medications        Envarsus 1mg   8 days worth of medication on hand.        Were doses missed due to medication being on hold? No    REFERRAL TO PHARMACIST     Referral to the pharmacist: Not needed      [facility]     Shipping address confirmed in Epic.     Delivery Scheduled: Yes, Expected medication delivery date: 11/18/21.     Medication will be delivered via UPS to the prescription address in Epic WAM.    Johnny Evans   [facility] Specialty Technician

## 2021-11-16 MED ORDER — SERTRALINE 100 MG TABLET
ORAL_TABLET | 4 refills | 0 days
Start: 2021-11-16 — End: ?

## 2021-11-17 MED FILL — PREDNISONE 5 MG TABLET: ORAL | 30 days supply | Qty: 30 | Fill #11

## 2021-11-17 MED FILL — ENVARSUS XR 1 MG TABLET,EXTENDED RELEASE: ORAL | 30 days supply | Qty: 150 | Fill #1

## 2021-12-15 DIAGNOSIS — Z940 Kidney transplant status: Principal | ICD-10-CM

## 2021-12-15 DIAGNOSIS — D849 Immunodeficiency, unspecified: Principal | ICD-10-CM

## 2021-12-15 MED ORDER — PREDNISONE 5 MG TABLET
ORAL_TABLET | Freq: Every day | ORAL | 11 refills | 30.00000 days
Start: 2021-12-15 — End: 2022-12-15

## 2021-12-15 NOTE — Unmapped (Signed)
[facility] Refill Coordination Note    Specialty Medication(s) to be Shipped:   Transplant: Envarsus XR 1mg  and Prednisone 5mg   Other medication(s) to be shipped: No additional medications requested for fill at this time     Johnny Evans, DOB: [birth year]  Phone: [phone] (home)     All above HIPAA information was verified with patient.     Was a Nurse, learning disability used for this call? No    Completed refill call assessment today to schedule patient's medication shipment from the [facility] [phone]).  All relevant notes have been reviewed.     Specialty medication(s) and dose(s) confirmed: Regimen is correct and unchanged.   Changes to medications: Johnny Evans reports no changes at this time.  Changes to insurance: No  New side effects reported not previously addressed with a pharmacist or physician: None reported  Questions for the pharmacist: No    Confirmed patient received a Conservation officer, historic buildings and a Surveyor, mining with first shipment. The patient will receive a drug information handout for each medication shipped and additional FDA Medication Guides as required.       DISEASE/MEDICATION-SPECIFIC INFORMATION        N/A    SPECIALTY MEDICATION ADHERENCE     Medication Adherence    Patient reported X missed doses in the last month: 0  Specialty Medication: prednisone 5mg   Patient is on additional specialty medications: Yes  Additional Specialty Medications: enavarsus1mg   Patient Reported Additional Medication X Missed Doses in the Last Month: 0  Patient is on more than two specialty medications: No  Any gaps in refill history greater than 2 weeks in the last 3 months: no  Informant: patient  Reliability of informant: reliable  Reasons for non-adherence: no problems identified  Adherence tools used: patient uses a pill box to manage medications        Were doses missed due to medication being on hold? No    Envarsus XR 1 mg: 7 days of medicine on hand   Prednisone 5mg  : 7 days of medicine on hand     REFERRAL TO PHARMACIST     Referral to the pharmacist: Not needed    Johnny Evans     Shipping address confirmed in Epic.     Delivery Scheduled: Yes, Expected medication delivery date: 12/18/2021.     Medication will be delivered via UPS to the prescription address in Epic WAM.    Johnny Evans P [facility] [place] [facility] Specialty Technician

## 2021-12-17 DIAGNOSIS — Z940 Kidney transplant status: Principal | ICD-10-CM

## 2021-12-17 DIAGNOSIS — D849 Immunodeficiency, unspecified: Principal | ICD-10-CM

## 2021-12-18 MED ORDER — PREDNISONE 5 MG TABLET
ORAL_TABLET | Freq: Every day | ORAL | 11 refills | 30 days | Status: CP
Start: 2021-12-18 — End: 2022-12-18
  Filled 2021-12-21: qty 30, 30d supply, fill #0

## 2021-12-18 NOTE — Unmapped (Signed)
Johnny Evans 's Envarsus shipment will be canceled  as a result of MAPs seeking manufacture assistance.     I have reached out to the patient  at [phone] and communicated the delivery change. We will not reschedule the medication and have removed this/these medication(s) from the work request.  We have canceled this work request.     Prednisone is still waiting for refills

## 2022-02-01 NOTE — Unmapped (Signed)
[facility] Refill Coordination Note    Specialty Medication(s) to be Shipped:   Transplant: Prednisone 5mg     Other medication(s) to be shipped: No additional medications requested for fill at this time   Patient verified that envarsus now comes from manufacturer, it is already set up with them, and he has already received a shipment from them for envarsus     Johnny Evans, DOB: 1976-02-10  Phone: [phone] (home)       All above HIPAA information was verified with patient.     Was a Nurse, learning disability used for this call? No    Completed refill call assessment today to schedule patient's medication shipment from the [facility] [phone]).  All relevant notes have been reviewed.     Specialty medication(s) and dose(s) confirmed: Regimen is correct and unchanged.   Changes to medications: Nazire reports no changes at this time.  Changes to insurance: No  New side effects reported not previously addressed with a pharmacist or physician: None reported  Questions for the pharmacist: No    Confirmed patient received a Conservation officer, historic buildings and a Surveyor, mining with first shipment. The patient will receive a drug information handout for each medication shipped and additional FDA Medication Guides as required.       DISEASE/MEDICATION-SPECIFIC INFORMATION        N/A    SPECIALTY MEDICATION ADHERENCE     Medication Adherence    Patient reported X missed doses in the last month: 0  Specialty Medication: Prednisone 5mg   Patient is on additional specialty medications: No  Adherence tools used: patient uses a pill box to manage medications              Were doses missed due to medication being on hold? No    Prednisone 5mg   : 10 days of medicine on hand     REFERRAL TO PHARMACIST     Referral to the pharmacist: Not needed      [facility]     Shipping address confirmed in Epic.     Delivery Scheduled: Yes, Expected medication delivery date: 02/08/2022.     Medication will be delivered via UPS to the prescription address in Epic WAM.    Thad Ranger   [facility] Specialty Pharmacist

## 2022-02-05 MED FILL — PREDNISONE 5 MG TABLET: ORAL | 30 days supply | Qty: 30 | Fill #1

## 2022-03-01 NOTE — Unmapped (Signed)
[facility] Pharm[facility] Refill Coordination Note    Specialty Medication(s) to be Shipped:   Transplant: Prednisone 5mg     Other medication(s) to be shipped: No additional medications requested for fill at this time     Johnny Evans, DOB: Nov 09, 1975  Phone: [phone] (home)       All above HIPAA information was verified with patient.     Was a Nurse, learning disability used for this call? No    Completed refill call assessment today to schedule patient's medication shipment from the [facility] [phone]).  All relevant notes have been reviewed.     Specialty medication(s) and dose(s) confirmed: Regimen is correct and unchanged.   Changes to medications: Johnny Evans reports no changes at this time.  Changes to insurance: No  New side effects reported not previously addressed with a pharmacist or physician: None reported  Questions for the pharmacist: No    Confirmed patient received a Conservation officer, historic buildings and a Surveyor, mining with first shipment. The patient will receive a drug information handout for each medication shipped and additional FDA Medication Guides as required.       DISEASE/MEDICATION-SPECIFIC INFORMATION        N/A    SPECIALTY MEDICATION ADHERENCE     Medication Adherence    Patient reported X missed doses in the last month: 0  Specialty Medication: prednisone 5mg   Patient is on additional specialty medications: No  Adherence tools used: patient uses a pill box to manage medications              Were doses missed due to medication being on hold? No    Prednisone  5 mg: 7 days of medicine on hand       REFERRAL TO PHARMACIST     Referral to the pharmacist: Not needed      [facility]     Shipping address confirmed in Epic.     Delivery Scheduled: Yes, Expected medication delivery date: 03/05/22.     Medication will be delivered via UPS to the prescription address in Epic WAM.    Johnny Evans   [facility] Specialty Technician

## 2022-03-04 MED FILL — PREDNISONE 5 MG TABLET: ORAL | 30 days supply | Qty: 30 | Fill #2

## 2022-03-26 MED FILL — PREDNISONE 5 MG TABLET: ORAL | 30 days supply | Qty: 30 | Fill #3

## 2022-04-13 ENCOUNTER — Inpatient Hospital Stay (HOSPITAL_COMMUNITY)
Admission: EM | Admit: 2022-04-13 | Discharge: 2022-04-15 | DRG: 247 | Disposition: A | Discharge status: Home / Self Care | Payer: Medicare Other | Attending: Cardiology | Admitting: Cardiology

## 2022-04-13 ENCOUNTER — Emergency Department (HOSPITAL_COMMUNITY): Payer: Medicare Other

## 2022-04-13 ENCOUNTER — Other Ambulatory Visit: Payer: Self-pay

## 2022-04-13 ENCOUNTER — Encounter (HOSPITAL_COMMUNITY): Payer: Self-pay | Admitting: *Deleted

## 2022-04-13 DIAGNOSIS — Z888 Allergy status to other drugs, medicaments and biological substances status: Secondary | ICD-10-CM

## 2022-04-13 DIAGNOSIS — Z87891 Personal history of nicotine dependence: Secondary | ICD-10-CM

## 2022-04-13 DIAGNOSIS — Z940 Kidney transplant status: Secondary | ICD-10-CM

## 2022-04-13 DIAGNOSIS — I2510 Atherosclerotic heart disease of native coronary artery without angina pectoris: Secondary | ICD-10-CM | POA: Diagnosis present

## 2022-04-13 DIAGNOSIS — Z8249 Family history of ischemic heart disease and other diseases of the circulatory system: Secondary | ICD-10-CM

## 2022-04-13 DIAGNOSIS — Z7952 Long term (current) use of systemic steroids: Secondary | ICD-10-CM

## 2022-04-13 DIAGNOSIS — I214 Non-ST elevation (NSTEMI) myocardial infarction: Secondary | ICD-10-CM | POA: Diagnosis not present

## 2022-04-13 DIAGNOSIS — R079 Chest pain, unspecified: Secondary | ICD-10-CM | POA: Diagnosis not present

## 2022-04-13 DIAGNOSIS — Z7960 Long term (current) use of unspecified immunomodulators and immunosuppressants: Secondary | ICD-10-CM

## 2022-04-13 DIAGNOSIS — E785 Hyperlipidemia, unspecified: Secondary | ICD-10-CM

## 2022-04-13 DIAGNOSIS — Z7982 Long term (current) use of aspirin: Secondary | ICD-10-CM

## 2022-04-13 DIAGNOSIS — I10 Essential (primary) hypertension: Secondary | ICD-10-CM

## 2022-04-13 DIAGNOSIS — Z955 Presence of coronary angioplasty implant and graft: Secondary | ICD-10-CM

## 2022-04-13 DIAGNOSIS — K219 Gastro-esophageal reflux disease without esophagitis: Secondary | ICD-10-CM | POA: Diagnosis present

## 2022-04-13 LAB — CBC
HCT: 45 % (ref 39.0–52.0)
Hemoglobin: 14.7 g/dL (ref 13.0–17.0)
MCH: 30.1 pg (ref 26.0–34.0)
MCHC: 32.7 g/dL (ref 30.0–36.0)
MCV: 92 fL (ref 80.0–100.0)
Platelets: 255 10*3/uL (ref 150–400)
RBC: 4.89 MIL/uL (ref 4.22–5.81)
RDW: 13.6 % (ref 11.5–15.5)
WBC: 11.7 10*3/uL — ABNORMAL HIGH (ref 4.0–10.5)
nRBC: 0 % (ref 0.0–0.2)

## 2022-04-13 NOTE — ED Notes (Signed)
Patient transported to X-ray 

## 2022-04-13 NOTE — ED Triage Notes (Signed)
Pt stating he was at rest when he had onset of pain "like someone is punching me in the chest" on the left side. Then says he felt weak after. Did have some shortness of breath. Says he has been under a lot of stress and not resting well.

## 2022-04-13 NOTE — ED Triage Notes (Signed)
Pt arrives via [facility] from home Per report by EMS, pt started having chest pain around 1900, left breast, non radiating. No SOB or dizziness. Feels weak. Worse with palpation. 12 lead unremarkable. ASA PTA by fire. Felt like similar anxiety attacks previously. Kidney transplant pt. IV established in the right AC. 150/100, HR 66, 96% RA, cbg 155.

## 2022-04-14 ENCOUNTER — Inpatient Hospital Stay (HOSPITAL_COMMUNITY)
Admission: EM | Disposition: A | Discharge status: Home / Self Care | Payer: Self-pay | Source: Home / Self Care | Attending: Cardiology

## 2022-04-14 ENCOUNTER — Telehealth (HOSPITAL_COMMUNITY): Payer: Self-pay | Admitting: Pharmacy Technician

## 2022-04-14 ENCOUNTER — Other Ambulatory Visit (HOSPITAL_COMMUNITY): Payer: BLUE CROSS/BLUE SHIELD

## 2022-04-14 ENCOUNTER — Encounter (HOSPITAL_COMMUNITY): Payer: Self-pay

## 2022-04-14 ENCOUNTER — Other Ambulatory Visit (HOSPITAL_COMMUNITY): Payer: Self-pay

## 2022-04-14 DIAGNOSIS — Z8249 Family history of ischemic heart disease and other diseases of the circulatory system: Secondary | ICD-10-CM | POA: Diagnosis not present

## 2022-04-14 DIAGNOSIS — I10 Essential (primary) hypertension: Secondary | ICD-10-CM | POA: Diagnosis present

## 2022-04-14 DIAGNOSIS — Z7952 Long term (current) use of systemic steroids: Secondary | ICD-10-CM | POA: Diagnosis not present

## 2022-04-14 DIAGNOSIS — K219 Gastro-esophageal reflux disease without esophagitis: Secondary | ICD-10-CM | POA: Diagnosis present

## 2022-04-14 DIAGNOSIS — Z7982 Long term (current) use of aspirin: Secondary | ICD-10-CM | POA: Diagnosis not present

## 2022-04-14 DIAGNOSIS — I214 Non-ST elevation (NSTEMI) myocardial infarction: Secondary | ICD-10-CM | POA: Diagnosis present

## 2022-04-14 DIAGNOSIS — E785 Hyperlipidemia, unspecified: Secondary | ICD-10-CM | POA: Diagnosis present

## 2022-04-14 DIAGNOSIS — Z940 Kidney transplant status: Secondary | ICD-10-CM | POA: Diagnosis not present

## 2022-04-14 DIAGNOSIS — I2510 Atherosclerotic heart disease of native coronary artery without angina pectoris: Secondary | ICD-10-CM | POA: Diagnosis present

## 2022-04-14 DIAGNOSIS — Z888 Allergy status to other drugs, medicaments and biological substances status: Secondary | ICD-10-CM | POA: Diagnosis not present

## 2022-04-14 DIAGNOSIS — R079 Chest pain, unspecified: Secondary | ICD-10-CM | POA: Diagnosis present

## 2022-04-14 DIAGNOSIS — Z7960 Long term (current) use of unspecified immunomodulators and immunosuppressants: Secondary | ICD-10-CM | POA: Diagnosis not present

## 2022-04-14 DIAGNOSIS — Z87891 Personal history of nicotine dependence: Secondary | ICD-10-CM | POA: Diagnosis not present

## 2022-04-14 HISTORY — PX: INTRAVASCULAR PRESSURE WIRE/FFR STUDY: CATH118243

## 2022-04-14 HISTORY — DX: Non-ST elevation (NSTEMI) myocardial infarction: I21.4

## 2022-04-14 HISTORY — PX: LEFT HEART CATH AND CORONARY ANGIOGRAPHY: CATH118249

## 2022-04-14 HISTORY — PX: CORONARY STENT INTERVENTION: CATH118234

## 2022-04-14 LAB — TROPONIN I (HIGH SENSITIVITY)
Troponin I (High Sensitivity): 6 ng/L (ref <18)
Troponin I (High Sensitivity): 70 ng/L — ABNORMAL HIGH (ref <18)
Troponin I (High Sensitivity): 4440 ng/L (ref <18)

## 2022-04-14 LAB — BASIC METABOLIC PANEL
Anion gap: 8 (ref 5–15)
BUN: 11 mg/dL (ref 6–20)
CO2: 24 mmol/L (ref 22–32)
Calcium: 9.4 mg/dL (ref 8.9–10.3)
Chloride: 105 mmol/L (ref 98–111)
Creatinine, Ser: 0.99 mg/dL (ref 0.61–1.24)
GFR, Estimated: >60 mL/min (ref >60)
Glucose, Bld: 149 mg/dL — ABNORMAL HIGH (ref 70–99)
Potassium: 3.7 mmol/L (ref 3.5–5.1)
Sodium: 137 mmol/L (ref 135–145)

## 2022-04-14 LAB — POCT ACTIVATED CLOTTING TIME: Activated Clotting Time: 269 seconds

## 2022-04-14 SURGERY — LEFT HEART CATH AND CORONARY ANGIOGRAPHY
Anesthesia: LOCAL

## 2022-04-14 MED ORDER — TICAGRELOR 90 MG PO TABS
ORAL_TABLET | ORAL | Status: AC
  Filled 2022-04-14: qty 1

## 2022-04-14 MED ORDER — PREDNISONE 5 MG PO TABS
5.0000 mg | ORAL_TABLET | Freq: Every day | ORAL | Status: DC
Start: 2022-04-15 — End: 2022-04-14

## 2022-04-14 MED ORDER — ASPIRIN 81 MG PO CHEW: 81.0000 mg | CHEWABLE_TABLET | ORAL | Status: DC

## 2022-04-14 MED ORDER — PREDNISONE 5 MG PO TABS: 5.0000 mg | ORAL_TABLET | Freq: Every day | ORAL | Status: DC

## 2022-04-14 MED ORDER — TICAGRELOR 90 MG PO TABS
ORAL_TABLET | ORAL | Status: AC
Start: 2022-04-14 — End: ?
  Filled 2022-04-14: qty 1

## 2022-04-14 MED ORDER — NITROGLYCERIN 2 % TD OINT
1.0000 [in_us] | TOPICAL_OINTMENT | Freq: Once | TRANSDERMAL | Status: AC
  Administered 2022-04-14: 1 [in_us] via TOPICAL
  Filled 2022-04-14: qty 1

## 2022-04-14 MED ORDER — NITROGLYCERIN 0.4 MG SL SUBL
0.4000 mg | SUBLINGUAL_TABLET | SUBLINGUAL | Status: AC
  Administered 2022-04-14: 0.4 mg via SUBLINGUAL
  Filled 2022-04-14: qty 1

## 2022-04-14 MED ORDER — IOHEXOL 350 MG/ML SOLN
INTRAVENOUS | Status: DC | PRN
  Administered 2022-04-14: 115 mL

## 2022-04-14 MED ORDER — TICAGRELOR 90 MG PO TABS
90.0000 mg | ORAL_TABLET | Freq: Two times a day (BID) | ORAL | Status: DC
  Administered 2022-04-14 – 2022-04-15 (×2): 90 mg via ORAL
  Filled 2022-04-14 (×2): qty 1

## 2022-04-14 MED ORDER — MIDAZOLAM HCL 2 MG/2ML IJ SOLN
INTRAMUSCULAR | Status: DC | PRN
  Administered 2022-04-14: 1 mg via INTRAVENOUS

## 2022-04-14 MED ORDER — ONDANSETRON HCL 4 MG/2ML IJ SOLN: 4.0000 mg | Freq: Four times a day (QID) | INTRAMUSCULAR | Status: DC | PRN

## 2022-04-14 MED ORDER — ASPIRIN 81 MG PO CHEW
324.0000 mg | CHEWABLE_TABLET | Freq: Once | ORAL | Status: AC
  Administered 2022-04-14: 324 mg via ORAL
  Filled 2022-04-14: qty 4

## 2022-04-14 MED ORDER — NITROGLYCERIN 1 MG/10 ML FOR IR/CATH LAB
INTRA_ARTERIAL | Status: AC
  Filled 2022-04-14: qty 10

## 2022-04-14 MED ORDER — MIDAZOLAM HCL 2 MG/2ML IJ SOLN
INTRAMUSCULAR | Status: AC
  Filled 2022-04-14: qty 2

## 2022-04-14 MED ORDER — TACROLIMUS ER 4 MG PO TB24
5.0000 mg | ORAL_TABLET | Freq: Every day | ORAL | Status: DC
  Administered 2022-04-14 – 2022-04-15 (×2): 5 mg via ORAL
  Filled 2022-04-14 (×2): qty 1

## 2022-04-14 MED ORDER — NITROGLYCERIN 0.4 MG SL SUBL: 0.4000 mg | SUBLINGUAL_TABLET | SUBLINGUAL | Status: DC | PRN

## 2022-04-14 MED ORDER — HEPARIN (PORCINE) IN NACL 1000-0.9 UT/500ML-% IV SOLN
INTRAVENOUS | Status: AC
  Filled 2022-04-14: qty 1000

## 2022-04-14 MED ORDER — HEPARIN BOLUS VIA INFUSION
4000.0000 [IU] | Freq: Once | INTRAVENOUS | Status: AC
Start: 2022-04-14 — End: 2022-04-14
  Administered 2022-04-14: 4000 [IU] via INTRAVENOUS
  Filled 2022-04-14: qty 4000

## 2022-04-14 MED ORDER — VERAPAMIL HCL 2.5 MG/ML IV SOLN
INTRAVENOUS | Status: AC
  Filled 2022-04-14: qty 2

## 2022-04-14 MED ORDER — SODIUM CHLORIDE 0.9% FLUSH
3.0000 mL | Freq: Two times a day (BID) | INTRAVENOUS | Status: DC
Start: 2022-04-14 — End: 2022-04-15
  Administered 2022-04-15: 3 mL via INTRAVENOUS

## 2022-04-14 MED ORDER — TICAGRELOR 90 MG PO TABS
ORAL_TABLET | ORAL | Status: DC | PRN
  Administered 2022-04-14: 180 mg via ORAL

## 2022-04-14 MED ORDER — FENTANYL CITRATE (PF) 100 MCG/2ML IJ SOLN
INTRAMUSCULAR | Status: AC
  Filled 2022-04-14: qty 2

## 2022-04-14 MED ORDER — HEPARIN (PORCINE) 25000 UT/250ML-% IV SOLN
1200.0000 [IU]/h | INTRAVENOUS | Status: DC
  Administered 2022-04-14: 1200 [IU]/h via INTRAVENOUS
  Filled 2022-04-14: qty 250

## 2022-04-14 MED ORDER — HEPARIN (PORCINE) IN NACL 1000-0.9 UT/500ML-% IV SOLN
INTRAVENOUS | Status: DC | PRN
  Administered 2022-04-14 (×2): 500 mL

## 2022-04-14 MED ORDER — ASPIRIN 81 MG PO CHEW
324.0000 mg | CHEWABLE_TABLET | ORAL | Status: AC
  Administered 2022-04-14: 324 mg via ORAL
  Filled 2022-04-14: qty 4

## 2022-04-14 MED ORDER — LIDOCAINE HCL (PF) 1 % IJ SOLN
INTRAMUSCULAR | Status: DC | PRN
  Administered 2022-04-14: 2 mL

## 2022-04-14 MED ORDER — VITAMIN D 25 MCG (1000 UNIT) PO TABS
1000.0000 [IU] | ORAL_TABLET | Freq: Every day | ORAL | Status: DC
Start: 2022-04-14 — End: 2022-04-15
  Administered 2022-04-14 – 2022-04-15 (×2): 1000 [IU] via ORAL
  Filled 2022-04-14 (×2): qty 1

## 2022-04-14 MED ORDER — FENTANYL CITRATE (PF) 100 MCG/2ML IJ SOLN
INTRAMUSCULAR | Status: DC | PRN
  Administered 2022-04-14: 50 ug via INTRAVENOUS

## 2022-04-14 MED ORDER — METOPROLOL TARTRATE 12.5 MG HALF TABLET
12.5000 mg | ORAL_TABLET | Freq: Two times a day (BID) | ORAL | Status: DC
  Administered 2022-04-14 – 2022-04-15 (×2): 12.5 mg via ORAL
  Filled 2022-04-14 (×2): qty 1

## 2022-04-14 MED ORDER — SODIUM CHLORIDE 0.9% FLUSH: 3.0000 mL | INTRAVENOUS | Status: DC | PRN

## 2022-04-14 MED ORDER — SODIUM CHLORIDE 0.9 % WEIGHT BASED INFUSION: 3.0000 mL/kg/h | INTRAVENOUS | Status: DC

## 2022-04-14 MED ORDER — HEPARIN SODIUM (PORCINE) 1000 UNIT/ML IJ SOLN
INTRAMUSCULAR | Status: DC | PRN
  Administered 2022-04-14: 4000 [IU] via INTRAVENOUS
  Administered 2022-04-14: 2000 [IU] via INTRAVENOUS
  Administered 2022-04-14: 5000 [IU] via INTRAVENOUS

## 2022-04-14 MED ORDER — LIDOCAINE HCL (PF) 1 % IJ SOLN
INTRAMUSCULAR | Status: AC
  Filled 2022-04-14: qty 30

## 2022-04-14 MED ORDER — SODIUM CHLORIDE 0.9 % IV SOLN: 250.0000 mL | INTRAVENOUS | Status: DC | PRN

## 2022-04-14 MED ORDER — HEPARIN SODIUM (PORCINE) 1000 UNIT/ML IJ SOLN
INTRAMUSCULAR | Status: AC
  Filled 2022-04-14: qty 10

## 2022-04-14 MED ORDER — VERAPAMIL HCL 2.5 MG/ML IV SOLN
INTRAVENOUS | Status: DC | PRN
  Administered 2022-04-14: 10 mL via INTRA_ARTERIAL

## 2022-04-14 MED ORDER — PREDNISONE 5 MG PO TABS
5.0000 mg | ORAL_TABLET | Freq: Every day | ORAL | Status: DC
  Administered 2022-04-14 – 2022-04-15 (×2): 5 mg via ORAL
  Filled 2022-04-14 (×2): qty 1

## 2022-04-14 MED ORDER — NITROGLYCERIN IN D5W 200-5 MCG/ML-% IV SOLN
0.0000 ug/min | INTRAVENOUS | Status: DC
  Administered 2022-04-14: 5 ug/min via INTRAVENOUS
  Filled 2022-04-14: qty 250

## 2022-04-14 MED ORDER — ASPIRIN 81 MG PO TBEC
81.0000 mg | DELAYED_RELEASE_TABLET | Freq: Every day | ORAL | Status: DC
  Administered 2022-04-15: 81 mg via ORAL
  Filled 2022-04-14: qty 1

## 2022-04-14 MED ORDER — SODIUM CHLORIDE 0.9 % IV SOLN: INTRAVENOUS | Status: DC

## 2022-04-14 MED ORDER — ACETAMINOPHEN 325 MG PO TABS: 650.0000 mg | ORAL_TABLET | ORAL | Status: DC | PRN

## 2022-04-14 MED ORDER — ENOXAPARIN SODIUM 40 MG/0.4ML IJ SOSY
40.0000 mg | PREFILLED_SYRINGE | INTRAMUSCULAR | Status: DC
  Administered 2022-04-15: 40 mg via SUBCUTANEOUS
  Filled 2022-04-14: qty 0.4

## 2022-04-14 MED ORDER — SODIUM CHLORIDE 0.9 % WEIGHT BASED INFUSION: 1.0000 mL/kg/h | INTRAVENOUS | Status: DC

## 2022-04-14 MED ORDER — ACETAMINOPHEN 325 MG PO TABS
650.0000 mg | ORAL_TABLET | ORAL | Status: DC | PRN
Start: 2022-04-14 — End: 2022-04-15

## 2022-04-14 MED ORDER — ATORVASTATIN CALCIUM 80 MG PO TABS
80.0000 mg | ORAL_TABLET | Freq: Every day | ORAL | Status: DC
  Administered 2022-04-14 – 2022-04-15 (×2): 80 mg via ORAL
  Filled 2022-04-14 (×2): qty 1

## 2022-04-14 SURGICAL SUPPLY — 18 items
BALL SAPPHIRE NC24 3.75X12 (BALLOONS) ×2
BALLOON SAPPHIRE NC24 3.75X12 (BALLOONS) IMPLANT
BAND CMPR LRG ZPHR (HEMOSTASIS) ×1
BAND ZEPHYR COMPRESS 30 LONG (HEMOSTASIS) ×1 IMPLANT
CATH INFINITI 5FR JK (CATHETERS) ×1 IMPLANT
CATH LAUNCHER 6FR EBU3.5 (CATHETERS) ×1 IMPLANT
GLIDESHEATH SLEND SS 6F .021 (SHEATH) ×1 IMPLANT
GUIDEWIRE INQWIRE 1.5J.035X260 (WIRE) IMPLANT
GUIDEWIRE PRESSURE X 175 (WIRE) ×1 IMPLANT
INQWIRE 1.5J .035X260CM (WIRE) ×2
KIT HEART LEFT (KITS) ×2 IMPLANT
PACK CARDIAC CATHETERIZATION (CUSTOM PROCEDURE TRAY) ×2 IMPLANT
STENT SYNERGY XD 3.50X16 (Permanent Stent) IMPLANT
STOPCOCK MORSE 400PSI 3WAY (MISCELLANEOUS) ×1 IMPLANT
SYNERGY XD 3.50X16 (Permanent Stent) ×2 IMPLANT
SYR MEDRAD MARK 7 150ML (SYRINGE) ×2 IMPLANT
TRANSDUCER W/STOPCOCK (MISCELLANEOUS) ×2 IMPLANT
TUBING CIL FLEX 10 FLL-RA (TUBING) ×2 IMPLANT

## 2022-04-14 NOTE — ED Provider Notes (Signed)
Care of patient assumed from Dr. Oletta Cohn at 8 AM.  This gentleman has a remote history of renal transplant in 2015 at [facility].  Last night at 7 PM he had a sudden onset of left-sided chest pain.  Although no recent EKGs are available for comparison, he does appear to have some subtle inferior changes when compared to remote EKGs.  Initial troponin was 6.  On delta, troponin increased to 70.  He is currently on heparin.  He has had improved chest pain with NTG.  This was discussed with cardiology here who requested reaching out to [facility].  [facility] cardiologist, Dr. Lucienne Minks, requested cardiology consult here, echocardiogram and third troponin.  Third troponin is currently pending.  Follow-up on result.  If elevated and patient is to undergo left heart cath, plan will be for [facility] transfer.  If stable, reengage cardiology here. Physical Exam  BP 137/80   Pulse (!) 58   Temp 98.3 F (36.8 C) (Oral)   Resp 16   Ht 6\' 1"  (1.854 m)   Wt 101.2 kg   SpO2 95%   BMI 29.42 kg/m   Physical Exam Vitals and nursing note reviewed.  Constitutional:      General: He is not in acute distress.    Appearance: He is well-developed and normal weight. He is not ill-appearing, toxic-appearing or diaphoretic.  HENT:     Head: Normocephalic and atraumatic.  Eyes:     Extraocular Movements: Extraocular movements intact.     Conjunctiva/sclera: Conjunctivae normal.  Cardiovascular:     Rate and Rhythm: Normal rate and regular rhythm.  Pulmonary:     Effort: Pulmonary effort is normal. No tachypnea or respiratory distress.  Abdominal:     Palpations: Abdomen is soft.     Tenderness: There is no abdominal tenderness.  Musculoskeletal:        General: No swelling. Normal range of motion.     Cervical back: Normal range of motion and neck supple.  Skin:    General: Skin is warm and dry.     Capillary Refill: Capillary refill takes less than 2 seconds.  Neurological:     General: No focal deficit present.     Mental Status: He  is alert and oriented to person, place, and time.  Psychiatric:        Mood and Affect: Mood normal.        Behavior: Behavior normal.     Procedures  Procedures  ED Course / MDM    Medical Decision Making Amount and/or Complexity of Data Reviewed Labs: ordered. Radiology: ordered.  Risk OTC drugs. Prescription drug management. Decision regarding hospitalization.   On assessment, patient resting comfortably.  He denies any current chest pain.  Vital signs notable for moderate hypertension.  Third troponin was 4440.  Due to his rising troponin and elevated blood pressure, NTG gtt. was initiated.  Patient remains on heparin.  Due to this marked increase in troponin, I did reengage with cardiology team here at [facility].  They did state that they will come evaluate the patient.  I also updated [facility] through the transfer line.  I spoke with cardiology fellow at [facility].  He does feel the patient needs a heart cath.  He stated that, if unable to do here, they will accept in transfer.  Cardiology evaluated the patient in the ED and will admit him here for heart cath.       [facility], MD 04/14/22 1051

## 2022-04-14 NOTE — ED Provider Notes (Signed)
[facility] EMERGENCY DEPARTMENT Provider Note   CSN: [id number] Arrival date & time: 04/13/22  2253     History  Chief Complaint  Patient presents with   Chest Pain    Dave [name] is a 46 y.o. male.  Patient presents to the emergency department for evaluation of chest pain.  Patient reports that he had sudden onset of pain in his chest around 7 PM.  He reports that it felt like someone punched him in the chest.  Pain has eased up some but is still present.       Home Medications Prior to Admission medications   Medication Sig Start Date End Date Taking? Authorizing Provider  albuterol (PROVENTIL) (2.5 MG/3ML) 0.083% nebulizer solution Take 2.5 mg by nebulization every 6 (six) hours as needed for wheezing.    [provider]  aspirin 81 MG tablet Take 81 mg by mouth daily.    [provider]  azaTHIOprine (IMURAN) 50 MG tablet Take 150 mg by mouth daily.    [provider]  Cholecalciferol (VITAMIN D) 1000 UNITS capsule Take 1,000 Units by mouth daily.    [provider]  MAGNESIUM PO Take by mouth 2 (two) times daily.    [provider]  metoprolol tartrate (LOPRESSOR) 25 MG tablet Take 25 mg by mouth 2 (two) times daily.    [provider]  Omega-3 Fatty Acids (FISH OIL) 1000 MG CAPS Take 1,000 mg by mouth daily.    [provider]  oxyCODONE-acetaminophen (ROXICET) 5-325 MG tablet Take 1-2 tablets by mouth every 4 (four) hours as needed for severe pain. 06/11/16   Beverly Gust, MD  predniSONE (DELTASONE) 5 MG tablet Take 5 mg by mouth daily with breakfast.    [provider]  ranitidine (ZANTAC) 150 MG tablet Take 150 mg by mouth daily as needed for heartburn.    [provider]  sertraline (ZOLOFT) 25 MG tablet Take 25 mg by mouth daily.    [provider]  tacrolimus (PROGRAF) 1 MG capsule Take 4 mg by mouth 2 (two) times daily. Takes 3 tabs AM, 2 tabs PM     [provider]      Allergies    Ace inhibitors    Review of Systems   Review of Systems  Physical Exam Updated Vital Signs BP 133/89   Pulse (!) 55   Temp 98.7 F (37.1 C) (Oral)   Resp 19   Ht 6\' 1"  (1.854 m)   Wt 101.2 kg   SpO2 96%   BMI 29.42 kg/m  Physical Exam Vitals and nursing note reviewed.  Constitutional:      General: He is not in acute distress.    Appearance: He is well-developed.  HENT:     Head: Normocephalic and atraumatic.     Mouth/Throat:     Mouth: Mucous membranes are moist.  Eyes:     General: Vision grossly intact. Gaze aligned appropriately.     Extraocular Movements: Extraocular movements intact.     Conjunctiva/sclera: Conjunctivae normal.  Cardiovascular:     Rate and Rhythm: Normal rate and regular rhythm.     Pulses: Normal pulses.     Heart sounds: Normal heart sounds, S1 normal and S2 normal. No murmur heard.    No friction rub. No gallop.  Pulmonary:     Effort: Pulmonary effort is normal. No respiratory distress.     Breath sounds: Normal breath sounds.  Abdominal:  Palpations: Abdomen is soft.     Tenderness: There is no abdominal tenderness. There is no guarding or rebound.     Hernia: No hernia is present.  Musculoskeletal:        General: No swelling.     Cervical back: Full passive range of motion without pain, normal range of motion and neck supple. No pain with movement, spinous process tenderness or muscular tenderness. Normal range of motion.     Right lower leg: No edema.     Left lower leg: No edema.  Skin:    General: Skin is warm and dry.     Capillary Refill: Capillary refill takes less than 2 seconds.     Findings: No ecchymosis, erythema, lesion or wound.  Neurological:     Mental Status: He is alert and oriented to person, place, and time.     GCS: GCS eye subscore is 4. GCS verbal subscore is 5. GCS motor subscore is 6.     Cranial Nerves: Cranial nerves 2-12 are intact.     Sensory: Sensation  is intact.     Motor: Motor function is intact. No weakness or abnormal muscle tone.     Coordination: Coordination is intact.  Psychiatric:        Mood and Affect: Mood normal.        Speech: Speech normal.        Behavior: Behavior normal.     ED Results / Procedures / Treatments   Labs (all labs ordered are listed, but only abnormal results are displayed) Labs Reviewed  BASIC METABOLIC PANEL - Abnormal; Notable for the following components:      Result Value   Glucose, Bld 149 (*)    All other components within normal limits  CBC - Abnormal; Notable for the following components:   WBC 11.7 (*)    All other components within normal limits  TROPONIN I (HIGH SENSITIVITY) - Abnormal; Notable for the following components:   Troponin I (High Sensitivity) 70 (*)    All other components within normal limits  TROPONIN I (HIGH SENSITIVITY)  TROPONIN I (HIGH SENSITIVITY)    EKG EKG Interpretation  Date/Time:  Tuesday April 13 2022 22:53:51 EDT Ventricular Rate:  63 PR Interval:  156 QRS Duration: 84 QT Interval:  368 QTC Calculation: 376 R Axis:   25 Text Interpretation: Normal sinus rhythm Nonspecific ST abnormality Abnormal ECG Confirmed by Gilda Crease [phone]) on 04/14/2022 4:04:53 AM  Radiology DG Chest 2 View  Result Date: 04/13/2022 CLINICAL DATA:  Chest pain EXAM: CHEST - 2 VIEW COMPARISON:  08/19/2014 FINDINGS: Lungs are clear.  No pleural effusion or pneumothorax. The heart is normal in size. Visualized osseous structures are within normal limits. IMPRESSION: Normal chest radiographs. Electronically Signed   By: Charline Bills M.D.   On: 04/13/2022 23:19    Procedures Procedures    Medications Ordered in ED Medications  nitroGLYCERIN (NITROGLYN) 2 % ointment 1 inch (has no administration in time range)  aspirin chewable tablet 324 mg (324 mg Oral Given 04/14/22 0455)  nitroGLYCERIN (NITROSTAT) SL tablet 0.4 mg (0.4 mg Sublingual Given 04/14/22 0456)     ED Course/ Medical Decision Making/ A&P                           Medical Decision Making Amount and/or Complexity of Data Reviewed Labs: ordered. Radiology: ordered.  Risk OTC drugs. Prescription drug management.   Patient presents to the  emergency department for evaluation of chest pain.  EKG at arrival revealed T wave inversions and possibly subtle ST depressions in inferior leads.  He does not have a recent EKG, most recent was from 2015, these findings were not present then.  Patient's pain came on at rest, no associated nausea, diaphoresis or shortness of breath.  He does not have any known heart disease.  Patient given aspirin and sublingual nitroglycerin, slight residual pain resolved with nitroglycerin.  Patient's first troponin normal at 6.  Second troponin returned at 70.  This concerning for NSTEMI.  I reached out to on-call cardiology, Dr. Hyacinth Meeker who felt that the patient might require cardiac catheterization based on these findings.  Given the fact that the patient is a renal transplant patient from [facility], he recommends consulting [facility] for possible transfer for definitive care.  I discussed the case with Dr. Alexia Freestone, cardiology at [facility].  Patient tentatively accepted for transfer but she would like a third troponin and possibly echo performed prior to transfer.  If third troponin is not significantly elevated, patient might not require cardiac catheterization and therefore would not require transfer.  CRITICAL CARE Performed by: Gilda Crease   Total critical care time: 40 minutes  Critical care time was exclusive of separately billable procedures and treating other patients.  Critical care was necessary to treat or prevent imminent or life-threatening deterioration.  Critical care was time spent personally by me on the following activities: development of treatment plan with patient and/or surrogate as well as nursing, discussions with consultants, evaluation  of patient's response to treatment, examination of patient, obtaining history from patient or surrogate, ordering and performing treatments and interventions, ordering and review of laboratory studies, ordering and review of radiographic studies, pulse oximetry and re-evaluation of patient's condition.         Final Clinical Impression(s) / ED Diagnoses Final diagnoses:  NSTEMI (non-ST elevated myocardial infarction) [facility])    Rx / DC Orders ED Discharge Orders     None         Scott Vanderveer, Canary Brim, MD 04/14/22 [phone]

## 2022-04-14 NOTE — Telephone Encounter (Signed)
Pharmacy Patient Advocate Encounter  Insurance verification completed.    The patient does not have any prescription coverage.  The patient is currently admitted and ran test claims for the following: Brilinta.  Copays and coinsurance results were relayed to Inpatient clinical team.

## 2022-04-14 NOTE — H&P (Signed)
Cardiology Admission History and Physical:   Patient ID: Dave [name] MRN: [id number]; DOB: 1976-03-16   Admission date: 04/13/2022  PCP:  [name], MD   [facility] Providers Cardiologist: New  Chief Complaint:  NSTEMI/Chest pain  Patient Profile:   Dave [name] is a 46 y.o. male with renal failure status post renal transplant '15, hypertension, GERD, possible paroxysmal atrial fibrillation who is being seen 04/14/2022 for the evaluation of chest pain/NSTEMI.  History of Present Illness:   Dave [name] is a 46 year old male with past medical history noted above.  He has been followed by [facility] since his renal transplant in 2015.  Had a nuclear stress test in 2010 which showed no evidence of ischemia.  Echocardiogram from 2014 showed LVEF of greater than 55% with no significant valvular disease.  He was seen by cardiology 04/2017 in regards to concern for possible atrial fibrillation.  He reported at that visit he was seen in the local ED for palpitations and diagnosed with atrial fibrillation.  He was placed on aspirin.  He was seen in the A-fib clinic and placed on a Zio patch which did not show any episodes of atrial fibrillation.  He was continued on aspirin as his CHA2DS2-VASc score was 1.  He presented to the ED on 7/25 with complaints of chest pain.  States this episode started around 8 PM that evening while he was resting.  Symptoms intensified, states it "felt like someone punching him in the chest" and he also felt palpitations.  Ultimately called EMS.   In the ED his labs showed sodium 137, potassium 3.7, creatinine 0.9, high-sensitivity troponin 6>> 70>> 4440, WBC 11.7, Hgb 14.7.  Chest x-ray negative.  EKG showed sinus rhythm, 63 bpm, T wave inversion in lead [name] and aVR, biphasic T wave in aVF.  Initial discussion with overnight cardiologist with recommendation to potentially transfer patient to [facility].  EDP discussed with cardiology at [facility] who requested patient  have a third troponin drawn and an echocardiogram to determine disposition.  Third troponin resulted at 4440.  Given his stable renal function decision made to keep at [facility] and take for cardiac catheterization.  Patient reports he is essentially pain-free at the time of interview.  He denies any family history of CAD or heart failure that he is aware of.  Discussed with patient remaining at [facility] to undergo hemic evaluation and he is agreeable.  He was requested for a bed and transport to [facility] which has been canceled.   Past Medical History:  Diagnosis Date   Asthma    Atrial fibrillation (HCC)    uses ASA and fish oil    Depression    GERD (gastroesophageal reflux disease)    Hx of gout    Hyperlipidemia    Hypertension    Kidney failure    Renal insufficiency     Past Surgical History:  Procedure Laterality Date   CYST EXCISION Left 06/11/2016   Procedure: CYST REMOVAL;  Surgeon: [name], MD;  Location: [facility];  Service: ENT;  Laterality: Left;  Left upper forehead   DIALYSIS FISTULA CREATION  2010   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     Medications Prior to Admission: Prior to Admission medications   Medication Sig Start Date End Date Taking? Authorizing Provider  albuterol (PROVENTIL) (2.5 MG/3ML) 0.083% nebulizer solution Take 2.5 mg by nebulization every 6 (six) hours as needed for wheezing.    [provider]  aspirin 81  MG tablet Take 81 mg by mouth daily.    [provider]  azaTHIOprine (IMURAN) 50 MG tablet Take 150 mg by mouth daily.    [provider]  Cholecalciferol (VITAMIN D) 1000 UNITS capsule Take 1,000 Units by mouth daily.    [provider]  MAGNESIUM PO Take by mouth 2 (two) times daily.    [provider]  metoprolol tartrate (LOPRESSOR) 25 MG tablet Take 25 mg by mouth 2 (two) times daily.    [provider]  Omega-3 Fatty Acids (FISH OIL) 1000 MG CAPS Take 1,000 mg by  mouth daily.    [provider]  oxyCODONE-acetaminophen (ROXICET) 5-325 MG tablet Take 1-2 tablets by mouth every 4 (four) hours as needed for severe pain. 06/11/16   [name], MD  predniSONE (DELTASONE) 5 MG tablet Take 5 mg by mouth daily with breakfast.    [provider]  ranitidine (ZANTAC) 150 MG tablet Take 150 mg by mouth daily as needed for heartburn.    [provider]  sertraline (ZOLOFT) 25 MG tablet Take 25 mg by mouth daily.    [provider]  tacrolimus (PROGRAF) 1 MG capsule Take 4 mg by mouth 2 (two) times daily. Takes 3 tabs AM, 2 tabs PM    [provider]     Allergies:    Allergies  Allergen Reactions   Ace Inhibitors Nausea Only    Social History:   Social History   Socioeconomic History   Marital status: Married    Spouse name: Not on file   Number of children: Not on file   Years of education: Not on file   Highest education level: Not on file  Occupational History   Not on file  Tobacco Use   Smoking status: Former    Packs/day: 0.50    Years: 7.00    Total pack years: 3.50    Types: Cigarettes    Quit date: 05/21/2009    Years since quitting: 12.9   Smokeless tobacco: Never  Substance and Sexual Activity   Alcohol use: No   Drug use: Not on file    Comment: Smoked marijuana for about 10-12 years; quit 2010.   Sexual activity: Not on file  Other Topics Concern   Not on file  Social History Narrative   Not on file   Social Determinants of Health   Financial Resource Strain: Not on file  Food Insecurity: Not on file  Transportation Needs: Not on file  Physical Activity: Not on file  Stress: Not on file  Social Connections: Not on file  Intimate Partner Violence: Not on file    Family History:   The patient's family history includes Heart disease in his father; Hyperlipidemia in his father; Hypertension in his father.    ROS:  Please see the history of present illness.  All other ROS  reviewed and negative.     Physical Exam/Data:   Vitals:   04/14/22 0900 04/14/22 0915 04/14/22 0930 04/14/22 0945  BP: (!) 136/97 (!) 142/94 (!) 138/93 (!) 151/97  Pulse: 61 (!) 58 (!) 53 81  Resp: 15 14 18 15   Temp:      TempSrc:      SpO2: 97% 93% 94% 97%  Weight:      Height:       No intake or output data in the 24 hours ending 04/14/22 1015    04/13/2022   11:06 PM 06/11/2016    8:36 AM 06/04/2016  9:23 AM  Last 3 Weights  Weight (lbs) 223 lb 246 lb 248 lb  Weight (kg) 101.152 kg 111.585 kg 112.492 kg     Body mass index is 29.42 kg/m.  General:  Well nourished, well developed, in no acute distress HEENT: normal Neck: no JVD Vascular: No carotid bruits; Distal pulses 2+ bilaterally   Cardiac:  normal S1, S2; RRR; no murmur  Lungs:  clear to auscultation bilaterally, no wheezing, rhonchi or rales  Abd: soft, nontender, no hepatomegaly  Ext: no edema Musculoskeletal:  No deformities, BUE and BLE strength normal and equal.  Old left upper extremity fistula.  Skin: warm and dry  Neuro:  CNs 2-12 intact, no focal abnormalities noted Psych:  Normal affect    EKG:  The ECG that was done 04/13/25 was personally reviewed and demonstrates sinus rhythm, 63 bpm, isolated T wave inversion in lead [name] and aVR, biphasic T wave in aVF  Relevant CV Studies:  Remote stress test 2010 negative for ischemia  Echocardiogram 2014 with normal LVEF  Laboratory Data:  High Sensitivity Troponin:   Recent Labs  Lab 04/13/22 2306 04/14/22 0131 04/14/22 0743  TROPONINIHS 6 70* 4,440*      Chemistry Recent Labs  Lab 04/13/22 2306  NA 137  K 3.7  CL 105  CO2 24  GLUCOSE 149*  BUN 11  CREATININE 0.99  CALCIUM 9.4  GFRNONAA >60  ANIONGAP 8    No results for input(s): "PROT", "ALBUMIN", "AST", "ALT", "ALKPHOS", "BILITOT" in the last 168 hours. Lipids No results for input(s): "CHOL", "TRIG", "HDL", "LABVLDL", "LDLCALC", "CHOLHDL" in the last 168 hours. Hematology Recent  Labs  Lab 04/13/22 2306  WBC 11.7*  RBC 4.89  HGB 14.7  HCT 45.0  MCV 92.0  MCH 30.1  MCHC 32.7  RDW 13.6  PLT 255   Thyroid No results for input(s): "TSH", "FREET4" in the last 168 hours. BNPNo results for input(s): "BNP", "PROBNP" in the last 168 hours.  DDimer No results for input(s): "DDIMER" in the last 168 hours.   Radiology/Studies:  DG Chest 2 View  Result Date: 04/13/2022 CLINICAL DATA:  Chest pain EXAM: CHEST - 2 VIEW COMPARISON:  08/19/2014 FINDINGS: Lungs are clear.  No pleural effusion or pneumothorax. The heart is normal in size. Visualized osseous structures are within normal limits. IMPRESSION: Normal chest radiographs. Electronically Signed   By: Charline Bills M.D.   On: 04/13/2022 23:19     Assessment and Plan:   Dave [name] is a 46 y.o. male with renal failure status post renal transplant '15, hypertension, GERD, possible paroxysmal atrial fibrillation who is being seen 04/14/2022 for the evaluation of chest pain/NSTEMI.  NSTEMI: Presented with chest pain that started around 8 PM last night.  High-sensitivity troponin 6>> 70>> 4440.  He has been started on IV heparin.  Essentially chest pain-free at the time of interview.  We will plan for cardiac catheterization.  -- admit to inpatient -- start aspirin, statin and low-dose beta-blocker -- check lipids, Hgb A1c -- Check echocardiogram  Shared Decision Making/Informed Consent The risks [stroke (1 in 1000), death (1 in 1000), kidney failure [usually temporary] (1 in 500), bleeding (1 in 200), allergic reaction [possibly serious] (1 in 200)], benefits (diagnostic support and management of coronary artery disease) and alternatives of a cardiac catheterization were discussed in detail with Dave [name] and he is willing to proceed.  Renal failure status post renal transplant '15: Follows through [facility], creatinine stable at 0.9 -- med rec  is pending, will continue home meds once completed  HTN: reports he  has not been on meds for blood pressure PTA -- will add low dose metoprolol 12.5mg  BID  Paroxsymal afib?: reports had an episode back in 2015 around the time of renal transplant. Wore Ziopatch with no episodes -- has been on ASA -- follow on telemetry  Severity of Illness: The appropriate patient status for this patient is INPATIENT. Inpatient status is judged to be reasonable and necessary in order to provide the required intensity of service to ensure the patient's safety. The patient's presenting symptoms, physical exam findings, and initial radiographic and laboratory data in the context of their chronic comorbidities is felt to place them at high risk for further clinical deterioration. Furthermore, it is not anticipated that the patient will be medically stable for discharge from the hospital within 2 midnights of admission.   * I certify that at the point of admission it is my clinical judgment that the patient will require inpatient hospital care spanning beyond 2 midnights from the point of admission due to high intensity of service, high risk for further deterioration and high frequency of surveillance required.*   For questions or updates, please contact [facility] Please consult [email or web address] for contact info under     Signed, Laverda Page, NP  04/14/2022 10:15 AM

## 2022-04-14 NOTE — ED Notes (Signed)
Pt called for X2. Pt could not be found.  

## 2022-04-14 NOTE — Progress Notes (Signed)
  Transition of Care [facility]) Screening Note   Patient Details  Name: Dave [name] Date of Birth: [birth year]   Transition of Care [facility]) CM/SW Contact:    Delilah Shan, LCSWA Phone Number: 04/14/2022, 3:01 PM    [facility]) has reviewed patient and no TOC needs have been identified at this time. We will continue to monitor patient advancement through interdisciplinary progression rounds. If new patient transition needs arise, please place a TOC consult.

## 2022-04-14 NOTE — ED Notes (Signed)
Called [facility] to transport [facility] to [facility]. Going to Room 3704. Unable to give an ETA but placed patient on the list.

## 2022-04-14 NOTE — Progress Notes (Signed)
ANTICOAGULATION CONSULT NOTE - Initial Consult  Pharmacy Consult for heparin Indication: chest pain/ACS  Allergies  Allergen Reactions   Ace Inhibitors Nausea Only    Patient Measurements: Height: 6\' 1"  (185.4 cm) Weight: 101.2 kg (223 lb) IBW/kg (Calculated) : 79.9 Heparin Dosing Weight: 100.3kg  Vital Signs: Temp: 98.7 F (37.1 C) (07/26 0421) Temp Source: Oral (07/26 0421) BP: 133/89 (07/26 0545) Pulse Rate: 55 (07/26 0545)  Labs: Recent Labs    04/13/22 2306 04/14/22 0131  HGB 14.7  --   HCT 45.0  --   PLT 255  --   CREATININE 0.99  --   TROPONINIHS 6 70*    Estimated Creatinine Clearance: 115.3 mL/min (by C-G formula based on SCr of 0.99 mg/dL).   Medical History: Past Medical History:  Diagnosis Date   Asthma    Atrial fibrillation (HCC)    uses ASA and fish oil    Depression    GERD (gastroesophageal reflux disease)    Hx of gout    Hyperlipidemia    Hypertension    Kidney failure    Renal insufficiency     Medications:  Infusions:   heparin      Assessment: 45 yom presented to the ED with CP. Troponin trending up. To start IV heparin. Baseline CBC is WNL and he is not on anticoagulation PTA.   Goal of Therapy:  Heparin level 0.3-0.7 units/ml Monitor platelets by anticoagulation protocol: Yes   Plan:  Heparin bolus 4000 units IV x 1 Heparin gtt 1200 units/hr Check a 6 hr heparin level Daily heparin level and CBC  Melita Villalona, 04/16/22 04/14/2022,7:19 AM

## 2022-04-14 NOTE — Unmapped (Signed)
[facility] Cardiology Transfer Center Request Note      6:19 AM  04/14/22    Requesting Physician: Dr. Oletta Cohn  Requesting [facility]: Redge Gainer  Requesting Service: cardiology    Reason for Transfer: NSTEMI    Brief Hospital Course: Johnny Evans is a 46 year old male with PMH HTN, HLD, asthma, atrial fibrillation, FSGS s/p renal transplant who presents to OSH with sudden onset chest pressure at rest. Pain started around 7 pm.   EKG per report shows inferior t wave inversions.  Hstrop 6, 70  Received aspirin and nitroglycerin and no longer having significant pain    Recent VS: T afebrile  HR 57 bpm  BP 126/92 mm Hg SpO?96%    Current medications: aspirin 324mg , nitroglycerin  Home cardiac meds: metoprolol    Plan Upon Arrival:   OSH to trend one more set of troponins and EKG; cardiology at osh to see to determine if patient needs lhc or stress test; if needs lhc, patient accepted to [facility] for transfer.       Accepting Service: MDC  Bed Type: Floor  Attending: Laurice Record    -------------------------------------------------    Damien Fusi, MD  [facility] Cardiovascular Medicine Fellow

## 2022-04-14 NOTE — Unmapped (Signed)
[facility] Emergency Dept 04/14/22 H&P notes.

## 2022-04-14 NOTE — ED Notes (Signed)
Called [facility] transfer line to transfer patient, facesheet faxed, waiting on provider to call MD

## 2022-04-14 NOTE — Interval H&P Note (Signed)
Cath Lab Visit (complete for each Cath Lab visit)  Clinical Evaluation Leading to the Procedure:   ACS: Yes.    Non-ACS:  n/a   History and Physical Interval Note:  04/14/2022 1:33 PM  Dave [name]  has presented today for surgery, with the diagnosis of nstemi.  The various methods of treatment have been discussed with the patient and family. After consideration of risks, benefits and other options for treatment, the patient has consented to  Procedure(s): LEFT HEART CATH AND CORONARY ANGIOGRAPHY (N/A) as a surgical intervention.  The patient's history has been reviewed, patient examined, no change in status, stable for surgery.  I have reviewed the patient's chart and labs.  Questions were answered to the patient's satisfaction.     Lorine Bears

## 2022-04-15 ENCOUNTER — Encounter (HOSPITAL_COMMUNITY): Payer: Self-pay | Admitting: Cardiovascular Disease

## 2022-04-15 ENCOUNTER — Telehealth: Payer: Self-pay | Admitting: Student

## 2022-04-15 ENCOUNTER — Other Ambulatory Visit (HOSPITAL_COMMUNITY): Payer: Self-pay

## 2022-04-15 ENCOUNTER — Inpatient Hospital Stay (HOSPITAL_COMMUNITY): Payer: Medicare Other

## 2022-04-15 DIAGNOSIS — I214 Non-ST elevation (NSTEMI) myocardial infarction: Secondary | ICD-10-CM

## 2022-04-15 DIAGNOSIS — I10 Essential (primary) hypertension: Secondary | ICD-10-CM

## 2022-04-15 DIAGNOSIS — Z940 Kidney transplant status: Secondary | ICD-10-CM

## 2022-04-15 DIAGNOSIS — E785 Hyperlipidemia, unspecified: Secondary | ICD-10-CM

## 2022-04-15 LAB — ECHOCARDIOGRAM COMPLETE
AR max vel: 3.94 cm2
AV Peak grad: 7.4 mmHg
Ao pk vel: 1.36 m/s
Area-P 1/2: 3.76 cm2
Calc EF: 57.3 %
Height: 73 in
S' Lateral: 3.3 cm
Single Plane A2C EF: 57.3 %
Single Plane A4C EF: 57.1 %
Weight: 3568 [oz_av]

## 2022-04-15 LAB — BASIC METABOLIC PANEL
Anion gap: 7 (ref 5–15)
BUN: 7 mg/dL (ref 6–20)
CO2: 24 mmol/L (ref 22–32)
Calcium: 9.5 mg/dL (ref 8.9–10.3)
Chloride: 107 mmol/L (ref 98–111)
Creatinine, Ser: 0.77 mg/dL (ref 0.61–1.24)
GFR, Estimated: >60 mL/min (ref >60)
Glucose, Bld: 118 mg/dL — ABNORMAL HIGH (ref 70–99)
Potassium: 4 mmol/L (ref 3.5–5.1)
Sodium: 138 mmol/L (ref 135–145)

## 2022-04-15 LAB — HEMOGLOBIN A1C
Hgb A1c MFr Bld: 5.9 % — ABNORMAL HIGH (ref 4.8–5.6)
Mean Plasma Glucose: 122.63 mg/dL

## 2022-04-15 LAB — HIV ANTIBODY (ROUTINE TESTING W REFLEX): HIV Screen 4th Generation wRfx: NONREACTIVE

## 2022-04-15 LAB — CBC
HCT: 44.2 % (ref 39.0–52.0)
Hemoglobin: 14.8 g/dL (ref 13.0–17.0)
MCH: 30.2 pg (ref 26.0–34.0)
MCHC: 33.5 g/dL (ref 30.0–36.0)
MCV: 90.2 fL (ref 80.0–100.0)
Platelets: 244 10*3/uL (ref 150–400)
RBC: 4.9 MIL/uL (ref 4.22–5.81)
RDW: 13.4 % (ref 11.5–15.5)
WBC: 12.8 10*3/uL — ABNORMAL HIGH (ref 4.0–10.5)
nRBC: 0 % (ref 0.0–0.2)

## 2022-04-15 LAB — LIPID PANEL
Cholesterol: 188 mg/dL (ref 0–200)
HDL: 37 mg/dL — ABNORMAL LOW (ref >40)
LDL Cholesterol: 133 mg/dL — ABNORMAL HIGH (ref 0–99)
Total CHOL/HDL Ratio: 5.1 RATIO
Triglycerides: 91 mg/dL (ref <150)
VLDL: 18 mg/dL (ref 0–40)

## 2022-04-15 MED ORDER — NITROGLYCERIN 0.4 MG SL SUBL
0.4000 mg | SUBLINGUAL_TABLET | SUBLINGUAL | 2 refills | Status: AC | PRN
  Filled 2022-04-15: qty 25, 7d supply, fill #0

## 2022-04-15 MED ORDER — AMLODIPINE BESYLATE 5 MG PO TABS
5.0000 mg | ORAL_TABLET | Freq: Every day | ORAL | 0 refills | Status: DC
  Filled 2022-04-15: qty 90, 90d supply, fill #0

## 2022-04-15 MED ORDER — METOPROLOL TARTRATE 25 MG PO TABS
12.5000 mg | ORAL_TABLET | Freq: Two times a day (BID) | ORAL | 0 refills | Status: DC
  Filled 2022-04-15: qty 90, 90d supply, fill #0

## 2022-04-15 MED ORDER — ATORVASTATIN CALCIUM 80 MG PO TABS
80.0000 mg | ORAL_TABLET | Freq: Every day | ORAL | 1 refills | Status: DC
  Filled 2022-04-15: qty 30, 30d supply, fill #0
  Filled 2022-05-13 (×2): qty 30, 30d supply, fill #1
  Filled 2022-05-25: qty 30, 30d supply, fill #0

## 2022-04-15 MED ORDER — AMLODIPINE BESYLATE 5 MG PO TABS
5.0000 mg | ORAL_TABLET | Freq: Every day | ORAL | Status: DC
  Administered 2022-04-15: 5 mg via ORAL
  Filled 2022-04-15: qty 1

## 2022-04-15 MED ORDER — TICAGRELOR 90 MG PO TABS
90.0000 mg | ORAL_TABLET | Freq: Two times a day (BID) | ORAL | 2 refills | Status: DC
  Filled 2022-04-15: qty 60, 30d supply, fill #0
  Filled 2022-05-13: qty 60, 30d supply, fill #1
  Filled 2022-05-25: qty 60, 30d supply, fill #0

## 2022-04-15 MED FILL — Nitroglycerin IV Soln 100 MCG/ML in D5W: INTRA_ARTERIAL | Qty: 10 | Status: AC

## 2022-04-15 NOTE — Telephone Encounter (Signed)
-----   Message from Arty Baumgartner, NP sent at 04/15/2022 10:27 AM EDT ----- Regarding: TOC call Needs TOC call please

## 2022-04-15 NOTE — Progress Notes (Signed)
CARDIAC REHAB PHASE I   PRE:  Rate/Rhythm: 61 SR   BP:  Sitting: 155/83      SaO2: 99 RA  MODE:  Ambulation: 470 ft   POST:  Rate/Rhythm: 58 SR  BP:  Sitting: 147/98      SaO2: 99 RA  Pt tolerated walk independently with no CP or SOB. Back to room  in chair for breakfast. Post stent education including risk factors,asa/brilinta importance, exercise guidelines,site care, MI booklet, heart healthy diet, restrictions, and CRP2. Pt is interested in CRP2. Will place referral  with [facility]. All questions and concerns addressed.   [id number]  Woodroe Chen, RN BSN 04/15/2022 9:29 AM

## 2022-04-15 NOTE — Discharge Instructions (Signed)

## 2022-04-15 NOTE — Discharge Summary (Signed)
Discharge Summary    Patient ID: Dave [name] MRN: [id number]; DOB: [birth year]  Admit date: 04/13/2022 Discharge date: 04/15/2022  PCP:  Lucinda Dell, MD   [facility] Providers Cardiologist:  Peter Martinique, MD     Discharge Diagnoses    Principal Problem:   NSTEMI (non-ST elevated myocardial infarction) [facility]) Active Problems:   Hypertension   Hyperlipidemia   History of renal transplant  Diagnostic Studies/Procedures    Cath: 04/14/22    Prox RCA lesion is 30% stenosed.   Dist RCA lesion is 60% stenosed.   Ramus lesion is 95% stenosed.   Mid LAD-1 lesion is 75% stenosed.   Mid LAD-2 lesion is 30% stenosed.   Dist LAD lesion is 20% stenosed.   Prox LAD lesion is 30% stenosed.   A drug-eluting stent was successfully placed using a SYNERGY XD 3.50X16.   Post intervention, there is a 0% residual stenosis.   1.  Significant underlying two-vessel coronary artery disease.  The culprit for non-ST elevation myocardial infarction seems to be severe stenosis of the distal ramus.  The vessel is small in diameter in that area and tortuous distally.  Borderline mid LAD stenosis was significant by flow reserve evaluation with an RFR ratio of 0.88.  Moderate distal RCA stenosis. 2.  Left ventricular angiography was not performed to minimize contrast use.  We will obtain an echocardiogram.  Normal left ventricular end-diastolic pressure. 3.  Successful drug-eluting stent placement to the mid LAD.   Recommendations: Dual antiplatelet therapy for at least 1 year. Aggressive treatment of risk factors. The distal ramus is too small to stent.  Diagnostic Dominance: Right  Intervention      Echo: 04/15/22  IMPRESSIONS     1. Left ventricular ejection fraction, by estimation, is 55%. The left  ventricle has normal function. The left ventricle has no regional wall  motion abnormalities. Left ventricular diastolic parameters were normal.   2. Right ventricular systolic  function is normal. The right ventricular  size is normal. Tricuspid regurgitation signal is inadequate for assessing  PA pressure.   3. The mitral valve is normal in structure. Trivial mitral valve  regurgitation. No evidence of mitral stenosis.   4. The aortic valve is tricuspid. There is mild calcification of the  aortic valve. Aortic valve regurgitation is not visualized. No aortic  stenosis is present.   5. The inferior vena cava is normal in size with <50% respiratory  variability, suggesting right atrial pressure of 8 mmHg.   FINDINGS   Left Ventricle: Left ventricular ejection fraction, by estimation, is  55%. The left ventricle has normal function. The left ventricle has no  regional wall motion abnormalities. The left ventricular internal cavity  size was normal in size. There is no  left ventricular hypertrophy. Left ventricular diastolic parameters were  normal.   Right Ventricle: The right ventricular size is normal. No increase in  right ventricular wall thickness. Right ventricular systolic function is  normal. Tricuspid regurgitation signal is inadequate for assessing PA  pressure.   Left Atrium: Left atrial size was normal in size.   Right Atrium: Right atrial size was normal in size.   Pericardium: There is no evidence of pericardial effusion.   Mitral Valve: The mitral valve is normal in structure. Trivial mitral  valve regurgitation. No evidence of mitral valve stenosis.   Tricuspid Valve: The tricuspid valve is normal in structure. Tricuspid  valve regurgitation is trivial.   Aortic Valve: The aortic valve is  tricuspid. There is mild calcification  of the aortic valve. Aortic valve regurgitation is not visualized. No  aortic stenosis is present. Aortic valve peak gradient measures 7.4 mmHg.   Pulmonic Valve: The pulmonic valve was normal in structure. Pulmonic valve  regurgitation is not visualized.   Aorta: The aortic root is normal in size and  structure.   Venous: The inferior vena cava is normal in size with less than 50%  respiratory variability, suggesting right atrial pressure of 8 mmHg.   IAS/Shunts: No atrial level shunt detected by color flow Doppler.  _____________   History of Present Illness     Dave [name] is a 46 y.o. male with with renal failure status post renal transplant '15, hypertension, GERD, possible paroxysmal atrial fibrillation who was seen 04/14/2022 for the evaluation of chest pain/NSTEMI.  He has been followed by [facility] since his renal transplant in 2015.  Had a nuclear stress test in 2010 which showed no evidence of ischemia.  Echocardiogram from 2014 showed LVEF of greater than 55% with no significant valvular disease.  He was seen by cardiology 04/2017 in regards to concern for possible atrial fibrillation.  He reported at that visit he was seen in the local ED for palpitations and diagnosed with atrial fibrillation.  He was placed on aspirin.  He was seen in the A-fib clinic and placed on a Zio patch which did not show any episodes of atrial fibrillation.  He was continued on aspirin as his CHA2DS2-VASc score was 1.   He presented to the ED on 7/25 with complaints of chest pain.  Stated this episode started around 8 PM that evening prior to admission while he was resting.  Symptoms intensified, stated it "felt like someone punching him in the chest" and he also felt palpitations.  Ultimately called EMS.    In the ED his labs showed sodium 137, potassium 3.7, creatinine 0.9, high-sensitivity troponin 6>> 70>> 4440, WBC 11.7, Hgb 14.7.  Chest x-ray negative.  EKG showed sinus rhythm, 63 bpm, T wave inversion in lead [name] and aVR, biphasic T wave in aVF.  Initial discussion with overnight cardiologist with recommendation to potentially transfer patient to [facility].  EDP discussed with cardiology at [facility] who requested patient have a third troponin drawn and an echocardiogram to determine disposition.  Third troponin  resulted at 4440.  Given his stable renal function decision made to keep at [facility] and take for cardiac catheterization.     He denies any family history of CAD or heart failure that he is aware of.    Hospital Course     NSTEMI: Presented with chest pain that started around 8 PM last night.  High-sensitivity troponin 6>> 70>> 4440.  He was treated with IV heparin.  Underwent cardiac catheterization noted above with significant underlying two-vessel CAD with culprit lesion felt to be severe stenosis of distal ramus which is a small diameter vessel too small for stenting.  Did have borderline mid LAD stenosis significant by RFR with ratio 0.88 treated with PCI/DES x1.  Recommendations for DAPT with aspirin/Brilinta for at least 1 year.  No recurrent chest pain.  Seen by cardiac rehab.  Follow-up echocardiogram showed LVEF of 55%, no regional wall motion abnormality, normal RV function and size, no significant valvular disease. -- Continue DAPT with aspirin/Brilinta, statin, low-dose metoprolol 12.5 mg twice daily amlodipine 5 mg daily   S/p renal transplant '15: Follows through [facility], creatinine stable at 0.9 -- Continue tacrolimus 5 mg daily,  prednisone 5 mg daily   HTN: reports he had not been on meds for blood pressure PTA.  Blood pressures were elevated during admission --Tolerated the addition of metoprolol 12.5 mg twice daily, amlodipine 5 mg daily   Paroxsymal afib?: reports had an episode back in 2015 around the time of renal transplant. Wore Ziopatch with no episodes.  No episodes noted on telemetry.   General: Well developed, well nourished, male appearing in no acute distress. Head: Normocephalic, atraumatic.  Neck: Supple without bruits, JVD. Lungs:  Resp regular and unlabored, CTA. Heart: RRR, S1, S2, no S3, S4, or murmur; no rub. Abdomen: Soft, non-tender, non-distended with normoactive bowel sounds. No hepatomegaly. No rebound/guarding. No obvious abdominal masses. Extremities:  No clubbing, cyanosis, edema. Distal pedal pulses are 2+ bilaterally. Right radial cath site stable without bruising or hematoma Neuro: Alert and oriented X 3. Moves all extremities spontaneously. Psych: Normal affect.  Patient was seen by Dr. Martinique and deemed stable for discharge home.  Follow-up in the office arranged.  Medications sent to [place].  Educated by [employer].D. prior to discharge.  Did the patient have an acute coronary syndrome (MI, NSTEMI, STEMI, etc) this admission?:  Yes                               AHA/ACC Clinical Performance & Quality Measures: Aspirin prescribed? - Yes ADP Receptor Inhibitor (Plavix/Clopidogrel, Brilinta/Ticagrelor or Effient/Prasugrel) prescribed (includes medically managed patients)? - Yes Beta Blocker prescribed? - Yes High Intensity Statin (Lipitor 40-80mg  or Crestor 20-40mg ) prescribed? - Yes EF assessed during THIS hospitalization? - Yes For EF <40%, was ACEI/ARB prescribed? - Not Applicable (EF >/= [id number]) For EF <40%, Aldosterone Antagonist (Spironolactone or Eplerenone) prescribed? - Not Applicable (EF >/= [id number]) Cardiac Rehab Phase II ordered (including medically managed patients)? - Yes     The patient will be scheduled for a TOC follow up appointment in 10-14 days.  A message has been sent to the [facility] and Scheduling Pool at the office where the patient should be seen for follow up.  _____________  Discharge Vitals Blood pressure (!) 146/75, pulse 60, temperature 99 F (37.2 C), temperature source Oral, resp. rate 18, height 6\' 1"  (1.854 m), weight 101.2 kg, SpO2 98 %.  Filed Weights   04/13/22 2306  Weight: 101.2 kg    Labs & Radiologic Studies    CBC Recent Labs    04/13/22 2306 04/15/22 0207  WBC 11.7* 12.8*  HGB 14.7 14.8  HCT 45.0 44.2  MCV 92.0 90.2  PLT 255 [id number]   Basic Metabolic Panel Recent Labs    04/13/22 2306 04/15/22 0207  NA 137 138  K 3.7 4.0  CL 105 107  CO2 24 24  GLUCOSE 149* 118*  BUN 11 7   CREATININE 0.99 0.77  CALCIUM 9.4 9.5   Liver Function Tests No results for input(s): "AST", "ALT", "ALKPHOS", "BILITOT", "PROT", "ALBUMIN" in the last 72 hours. No results for input(s): "LIPASE", "AMYLASE" in the last 72 hours. High Sensitivity Troponin:   Recent Labs  Lab 04/13/22 2306 04/14/22 0131 04/14/22 0743  TROPONINIHS 6 70* 4,440*    BNP Invalid input(s): "POCBNP" D-Dimer No results for input(s): "DDIMER" in the last 72 hours. Hemoglobin A1C Recent Labs    04/15/22 0207  HGBA1C 5.9*   Fasting Lipid Panel Recent Labs    04/15/22 0207  CHOL 188  HDL 37*  LDLCALC 133*  TRIG 91  CHOLHDL 5.1   Thyroid Function Tests No results for input(s): "TSH", "T4TOTAL", "T3FREE", "THYROIDAB" in the last 72 hours.  Invalid input(s): "FREET3" _____________  ECHOCARDIOGRAM COMPLETE  Result Date: 04/15/2022    ECHOCARDIOGRAM REPORT   Patient Name:   Dave [name] Date of Exam: 04/15/2022 Medical Rec #:  [id number]          Height:       73.0 in Accession #:    [id number]         Weight:       223.0 lb Date of Birth:  1976/06/26          BSA:          2.254 m Patient Age:    45 years           BP:           146/75 mmHg Patient Gender: M                  HR:           59 bpm. Exam Location:  Inpatient Procedure: 2D Echo, Cardiac Doppler and Color Doppler Indications:    NSTEMI  History:        Patient has no prior history of Echocardiogram examinations.  Sonographer:    Cleatis Polka Referring Phys: [id number] MUHAMMAD A ARIDA IMPRESSIONS  1. Left ventricular ejection fraction, by estimation, is 55%. The left ventricle has normal function. The left ventricle has no regional wall motion abnormalities. Left ventricular diastolic parameters were normal.  2. Right ventricular systolic function is normal. The right ventricular size is normal. Tricuspid regurgitation signal is inadequate for assessing PA pressure.  3. The mitral valve is normal in structure. Trivial mitral valve regurgitation. No  evidence of mitral stenosis.  4. The aortic valve is tricuspid. There is mild calcification of the aortic valve. Aortic valve regurgitation is not visualized. No aortic stenosis is present.  5. The inferior vena cava is normal in size with <50% respiratory variability, suggesting right atrial pressure of 8 mmHg. FINDINGS  Left Ventricle: Left ventricular ejection fraction, by estimation, is 55%. The left ventricle has normal function. The left ventricle has no regional wall motion abnormalities. The left ventricular internal cavity size was normal in size. There is no left ventricular hypertrophy. Left ventricular diastolic parameters were normal. Right Ventricle: The right ventricular size is normal. No increase in right ventricular wall thickness. Right ventricular systolic function is normal. Tricuspid regurgitation signal is inadequate for assessing PA pressure. Left Atrium: Left atrial size was normal in size. Right Atrium: Right atrial size was normal in size. Pericardium: There is no evidence of pericardial effusion. Mitral Valve: The mitral valve is normal in structure. Trivial mitral valve regurgitation. No evidence of mitral valve stenosis. Tricuspid Valve: The tricuspid valve is normal in structure. Tricuspid valve regurgitation is trivial. Aortic Valve: The aortic valve is tricuspid. There is mild calcification of the aortic valve. Aortic valve regurgitation is not visualized. No aortic stenosis is present. Aortic valve peak gradient measures 7.4 mmHg. Pulmonic Valve: The pulmonic valve was normal in structure. Pulmonic valve regurgitation is not visualized. Aorta: The aortic root is normal in size and structure. Venous: The inferior vena cava is normal in size with less than 50% respiratory variability, suggesting right atrial pressure of 8 mmHg. IAS/Shunts: No atrial level shunt detected by color flow Doppler.  LEFT VENTRICLE PLAX 2D LVIDd:         4.50 cm  Diastology LVIDs:         3.30 cm      LV  e' medial:    9.01 cm/s LV PW:         1.10 cm      LV E/e' medial:  8.6 LV IVS:        1.00 cm      LV e' lateral:   13.20 cm/s LVOT diam:     2.20 cm      LV E/e' lateral: 5.8 LV SV:         99 LV SV Index:   44 LVOT Area:     3.80 cm  LV Volumes (MOD) LV vol d, MOD A2C: 112.0 ml LV vol d, MOD A4C: 102.0 ml LV vol s, MOD A2C: 47.8 ml LV vol s, MOD A4C: 43.8 ml LV SV MOD A2C:     64.2 ml LV SV MOD A4C:     102.0 ml LV SV MOD BP:      61.4 ml RIGHT VENTRICLE            IVC RV Basal diam:  3.00 cm    IVC diam: 1.90 cm RV Mid diam:    2.50 cm RV S prime:     8.24 cm/s TAPSE (M-mode): 2.0 cm LEFT ATRIUM             Index        RIGHT ATRIUM           Index LA diam:        3.60 cm 1.60 cm/m   RA Area:     13.50 cm LA Vol (A2C):   43.4 ml 19.26 ml/m  RA Volume:   32.60 ml  14.47 ml/m LA Vol (A4C):   37.5 ml 16.64 ml/m LA Biplane Vol: 40.5 ml 17.97 ml/m  AORTIC VALVE AV Area (Vmax): 3.94 cm AV Vmax:        136.00 cm/s AV Peak Grad:   7.4 mmHg LVOT Vmax:      141.00 cm/s LVOT Vmean:     90.200 cm/s LVOT VTI:       0.261 m  AORTA Ao Root diam: 3.30 cm Ao Asc diam:  2.80 cm MITRAL VALVE MV Area (PHT): 3.76 cm    SHUNTS MV Decel Time: 202 msec    Systemic VTI:  0.26 m MV E velocity: 77.10 cm/s  Systemic Diam: 2.20 cm MV A velocity: 45.00 cm/s MV E/A ratio:  1.71 Dalton McleanMD Electronically signed by Franki Monte Signature Date/Time: 04/15/2022/1:22:18 PM    Final    CARDIAC CATHETERIZATION  Result Date: 04/14/2022   Prox RCA lesion is 30% stenosed.   Dist RCA lesion is 60% stenosed.   Ramus lesion is 95% stenosed.   Mid LAD-1 lesion is 75% stenosed.   Mid LAD-2 lesion is 30% stenosed.   Dist LAD lesion is 20% stenosed.   Prox LAD lesion is 30% stenosed.   A drug-eluting stent was successfully placed using a SYNERGY XD 3.50X16.   Post intervention, there is a 0% residual stenosis. 1.  Significant underlying two-vessel coronary artery disease.  The culprit for non-ST elevation myocardial infarction seems to be  severe stenosis of the distal ramus.  The vessel is small in diameter in that area and tortuous distally.  Borderline mid LAD stenosis was significant by flow reserve evaluation with an RFR ratio of 0.88.  Moderate distal RCA stenosis. 2.  Left ventricular angiography was not performed to minimize  contrast use.  We will obtain an echocardiogram.  Normal left ventricular end-diastolic pressure. 3.  Successful drug-eluting stent placement to the mid LAD. Recommendations: Dual antiplatelet therapy for at least 1 year. Aggressive treatment of risk factors. The distal ramus is too small to stent.   DG Chest 2 View  Result Date: 04/13/2022 CLINICAL DATA:  Chest pain EXAM: CHEST - 2 VIEW COMPARISON:  08/19/2014 FINDINGS: Lungs are clear.  No pleural effusion or pneumothorax. The heart is normal in size. Visualized osseous structures are within normal limits. IMPRESSION: Normal chest radiographs. Electronically Signed   By: Julian Hy M.D.   On: 04/13/2022 23:19    Disposition   Pt is being discharged home today in good condition.  Follow-up Plans & Appointments     Follow-up Information     Darreld Mclean, PA-C Follow up on 04/28/2022.   Specialties: Physician Assistant, Cardiology Why: at 10am for your follow up appt with Dr. [place] information: [address] [place] [address] [phone]                Discharge Instructions     Amb Referral to Cardiac Rehabilitation   Complete by: As directed    Diagnosis:  PTCA NSTEMI     After initial evaluation and assessments completed: Virtual Based Care may be provided alone or in conjunction with Phase 2 Cardiac Rehab based on patient barriers.: Yes       Discharge Medications   Allergies as of 04/15/2022       Reactions   Ace Inhibitors Nausea Only        Medication List     TAKE these medications    amLODipine 5 MG tablet Commonly known as: NORVASC Take 1 tablet (5 mg  total) by mouth daily. Start taking on: April 16, 2022   aspirin 81 MG tablet Take 81 mg by mouth daily.   atorvastatin 80 MG tablet Commonly known as: LIPITOR Take 1 tablet (80 mg total) by mouth daily. Start taking on: April 16, 2022   Brilinta 90 MG Tabs tablet Generic drug: ticagrelor Take 1 tablet (90 mg total) by mouth 2 (two) times daily.   ENVARSUS XR PO Take 5 tablets by mouth daily before breakfast.   metoprolol tartrate 25 MG tablet Commonly known as: LOPRESSOR Take 0.5 tablets (12.5 mg total) by mouth 2 (two) times daily.   nitroGLYCERIN 0.4 MG SL tablet Commonly known as: NITROSTAT Place 1 tablet (0.4 mg total) under the tongue every 5 (five) minutes x 3 doses as needed for chest pain.   predniSONE 5 MG tablet Commonly known as: DELTASONE Take 5 mg by mouth daily with breakfast.   Vitamin D 1000 units capsule Take 1,000 Units by mouth daily.        Outstanding Labs/Studies   FLP/LFTs in 8 weeks  Duration of Discharge Encounter   Greater than 30 minutes including physician time.  Signed, Reino Bellis, NP 04/15/2022, 1:41 PM

## 2022-04-15 NOTE — Telephone Encounter (Signed)
Patient discharged 04/15/22 - TOC call should be 04/16/22

## 2022-04-16 ENCOUNTER — Encounter: Payer: Self-pay | Admitting: *Deleted

## 2022-04-16 ENCOUNTER — Other Ambulatory Visit: Payer: Self-pay

## 2022-04-16 ENCOUNTER — Encounter (HOSPITAL_COMMUNITY): Payer: Self-pay

## 2022-04-16 ENCOUNTER — Emergency Department (HOSPITAL_COMMUNITY)
Admission: EM | Admit: 2022-04-16 | Discharge: 2022-04-16 | Disposition: A | Discharge status: Home / Self Care | Payer: Self-pay | Attending: Emergency Medicine | Admitting: Emergency Medicine

## 2022-04-16 ENCOUNTER — Emergency Department (HOSPITAL_COMMUNITY): Payer: Self-pay

## 2022-04-16 ENCOUNTER — Emergency Department (HOSPITAL_BASED_OUTPATIENT_CLINIC_OR_DEPARTMENT_OTHER)
Admit: 2022-04-16 | Discharge: 2022-04-16 | Disposition: A | Discharge status: Home / Self Care | Payer: Self-pay | Attending: Physician Assistant | Admitting: Physician Assistant

## 2022-04-16 DIAGNOSIS — R002 Palpitations: Secondary | ICD-10-CM

## 2022-04-16 DIAGNOSIS — Z79899 Other long term (current) drug therapy: Secondary | ICD-10-CM | POA: Insufficient documentation

## 2022-04-16 DIAGNOSIS — E785 Hyperlipidemia, unspecified: Secondary | ICD-10-CM | POA: Insufficient documentation

## 2022-04-16 DIAGNOSIS — I214 Non-ST elevation (NSTEMI) myocardial infarction: Secondary | ICD-10-CM | POA: Insufficient documentation

## 2022-04-16 DIAGNOSIS — I10 Essential (primary) hypertension: Secondary | ICD-10-CM | POA: Insufficient documentation

## 2022-04-16 DIAGNOSIS — Z006 Encounter for examination for normal comparison and control in clinical research program: Secondary | ICD-10-CM

## 2022-04-16 DIAGNOSIS — I25118 Atherosclerotic heart disease of native coronary artery with other forms of angina pectoris: Secondary | ICD-10-CM

## 2022-04-16 DIAGNOSIS — Z7982 Long term (current) use of aspirin: Secondary | ICD-10-CM | POA: Insufficient documentation

## 2022-04-16 DIAGNOSIS — Z7952 Long term (current) use of systemic steroids: Secondary | ICD-10-CM | POA: Insufficient documentation

## 2022-04-16 DIAGNOSIS — Z87891 Personal history of nicotine dependence: Secondary | ICD-10-CM | POA: Insufficient documentation

## 2022-04-16 DIAGNOSIS — I209 Angina pectoris, unspecified: Secondary | ICD-10-CM

## 2022-04-16 DIAGNOSIS — Z955 Presence of coronary angioplasty implant and graft: Secondary | ICD-10-CM | POA: Insufficient documentation

## 2022-04-16 DIAGNOSIS — J45909 Unspecified asthma, uncomplicated: Secondary | ICD-10-CM | POA: Insufficient documentation

## 2022-04-16 LAB — LIPOPROTEIN A (LP(A)): LIPOPROTEIN (A): 238.2 — ABNORMAL HIGH

## 2022-04-16 LAB — CBC WITH DIFFERENTIAL/PLATELET
Abs Immature Granulocytes: 0.07 10*3/uL (ref 0.00–0.07)
Basophils Absolute: 0 10*3/uL (ref 0.0–0.1)
Basophils Relative: 0 %
Eosinophils Absolute: 0.2 10*3/uL (ref 0.0–0.5)
Eosinophils Relative: 2 %
HCT: 43.4 % (ref 39.0–52.0)
Hemoglobin: 14.5 g/dL (ref 13.0–17.0)
Immature Granulocytes: 1 %
Lymphocytes Relative: 13 %
Lymphs Abs: 1.3 10*3/uL (ref 0.7–4.0)
MCH: 30.5 pg (ref 26.0–34.0)
MCHC: 33.4 g/dL (ref 30.0–36.0)
MCV: 91.4 fL (ref 80.0–100.0)
Monocytes Absolute: 0.9 10*3/uL (ref 0.1–1.0)
Monocytes Relative: 9 %
Neutro Abs: 7.8 10*3/uL — ABNORMAL HIGH (ref 1.7–7.7)
Neutrophils Relative %: 75 %
Platelets: 239 10*3/uL (ref 150–400)
RBC: 4.75 MIL/uL (ref 4.22–5.81)
RDW: 13.3 % (ref 11.5–15.5)
WBC: 10.3 10*3/uL (ref 4.0–10.5)
nRBC: 0 % (ref 0.0–0.2)

## 2022-04-16 LAB — BASIC METABOLIC PANEL
Anion gap: 10 (ref 5–15)
BUN: 15 mg/dL (ref 6–20)
CO2: 21 mmol/L — ABNORMAL LOW (ref 22–32)
Calcium: 9.4 mg/dL (ref 8.9–10.3)
Chloride: 108 mmol/L (ref 98–111)
Creatinine, Ser: 1.08 mg/dL (ref 0.61–1.24)
GFR, Estimated: >60 mL/min (ref >60)
Glucose, Bld: 165 mg/dL — ABNORMAL HIGH (ref 70–99)
Potassium: 3.6 mmol/L (ref 3.5–5.1)
Sodium: 139 mmol/L (ref 135–145)

## 2022-04-16 LAB — APTT: aPTT: 26 seconds (ref 24–36)

## 2022-04-16 LAB — TROPONIN I (HIGH SENSITIVITY): Troponin I (High Sensitivity): 2133 ng/L (ref <18)

## 2022-04-16 LAB — PROTIME-INR
INR: 1.1 (ref 0.8–1.2)
Prothrombin Time: 13.6 seconds (ref 11.4–15.2)

## 2022-04-16 LAB — LIPOPROTEIN A (LPA): Lipoprotein (a): 238.2 nmol/L — ABNORMAL HIGH (ref <75.0)

## 2022-04-16 NOTE — ED Notes (Signed)
EKG tech brought heart monitor to place on pt and is at bedside at this time

## 2022-04-16 NOTE — Telephone Encounter (Signed)
Patient still admitted.

## 2022-04-16 NOTE — ED Provider Notes (Signed)
[facility] EMERGENCY DEPARTMENT Provider Note   CSN: [id number] Arrival date & time: 04/16/22  7494     History  Chief Complaint  Patient presents with   Chest Pain    Dave [name] is a 46 y.o. male.  Dave [name] is a 46 y.o. male with history of recent NSTEMI with DES to mid LAD on 7/26, discharged from hospital yesterday, who presents via EMS for evaluation of chest pain.  Patient reports this morning he went for a walk for about 5 minutes, came inside and felt okay and then went to lay down.  He reports feeling a palpitation like sensation and then having an intense pressure like someone was sitting on his chest.  He took 1 sublingual nitro and 324 of aspirin prior to EMS arrival.  He reports now he still has a faint fluttering sensation, and very mild pressure but pain is significantly improved.  He reports this pain felt similar to pain he had prior to his recent NSTEMI but not as intense.  He reports some associated diaphoresis and shortness of breath as well as lightheadedness, no syncope.  No abdominal pain, nausea or vomiting.  Reports taking his Brilinta last night and this morning and taking all other medications as usual.  The history is provided by the patient and medical records.  Chest Pain Associated symptoms: diaphoresis and shortness of breath   Associated symptoms: no abdominal pain, no fever, no nausea and no vomiting        Home Medications Prior to Admission medications   Medication Sig Start Date End Date Taking? Authorizing Provider  amLODipine (NORVASC) 5 MG tablet Take 1 tablet (5 mg total) by mouth daily. 04/16/22   Arty Baumgartner, NP  aspirin 81 MG tablet Take 81 mg by mouth daily.    [provider]  atorvastatin (LIPITOR) 80 MG tablet Take 1 tablet (80 mg total) by mouth daily. 04/16/22   Arty Baumgartner, NP  Cholecalciferol (VITAMIN D) 1000 UNITS capsule Take 1,000 Units by mouth daily.    [provider]  metoprolol tartrate (LOPRESSOR) 25 MG tablet Take 0.5 tablets (12.5 mg total) by mouth 2 (two) times daily. 04/15/22   Arty Baumgartner, NP  nitroGLYCERIN (NITROSTAT) 0.4 MG SL tablet Place 1 tablet (0.4 mg total) under the tongue every 5 (five) minutes x 3 doses as needed for chest pain. 04/15/22   Arty Baumgartner, NP  predniSONE (DELTASONE) 5 MG tablet Take 5 mg by mouth daily with breakfast.    [provider]  Tacrolimus (ENVARSUS XR PO) Take 5 tablets by mouth daily before breakfast.    [provider]  ticagrelor (BRILINTA) 90 MG TABS tablet Take 1 tablet (90 mg total) by mouth 2 (two) times daily. 04/15/22   Arty Baumgartner, NP      Allergies    Ace inhibitors    Review of Systems   Review of Systems  Constitutional:  Positive for diaphoresis. Negative for chills and fever.  Respiratory:  Positive for shortness of breath.   Cardiovascular:  Positive for chest pain.  Gastrointestinal:  Negative for abdominal pain, nausea and vomiting.  Neurological:  Positive for light-headedness. Negative for syncope.  All other systems reviewed and are negative.   Physical Exam Updated Vital Signs BP 107/78   Pulse (!) 58   Temp 98.5 F (36.9 C) (Oral)   Resp 11   Ht 6\' 1"  (1.854 m)   Wt 99.8 kg  SpO2 99%   BMI 29.03 kg/m  Physical Exam Vitals and nursing note reviewed.  Constitutional:      General: He is not in acute distress.    Appearance: Normal appearance. He is well-developed and normal weight. He is not diaphoretic.  HENT:     Head: Normocephalic and atraumatic.  Eyes:     General:        Right eye: No discharge.        Left eye: No discharge.     Pupils: Pupils are equal, round, and reactive to light.  Cardiovascular:     Rate and Rhythm: Normal rate and regular rhythm.     Pulses: Normal pulses.          Radial pulses are 2+ on the right side and 2+ on the left side.       Dorsalis pedis pulses are 2+ on the right side and 2+  on the left side.     Heart sounds: Normal heart sounds. No murmur heard.    No friction rub. No gallop.  Pulmonary:     Effort: Pulmonary effort is normal. No respiratory distress.     Breath sounds: Normal breath sounds. No wheezing or rales.     Comments: Respirations equal and unlabored, patient able to speak in full sentences, lungs clear to auscultation bilaterally  Chest:     Chest wall: No tenderness.  Abdominal:     General: Bowel sounds are normal. There is no distension.     Palpations: Abdomen is soft. There is no mass.     Tenderness: There is no abdominal tenderness. There is no guarding.     Comments: Abdomen soft, nondistended, nontender to palpation in all quadrants without guarding or peritoneal signs  Musculoskeletal:        General: No deformity.     Cervical back: Neck supple.     Right lower leg: No edema.     Left lower leg: No edema.  Skin:    General: Skin is warm and dry.     Capillary Refill: Capillary refill takes less than 2 seconds.  Neurological:     Mental Status: He is alert and oriented to person, place, and time.     Coordination: Coordination normal.     Comments: Speech is clear, able to follow commands Moves extremities without ataxia, coordination intact  Psychiatric:        Mood and Affect: Mood normal.        Behavior: Behavior normal.     ED Results / Procedures / Treatments   Labs (all labs ordered are listed, but only abnormal results are displayed) Labs Reviewed  BASIC METABOLIC PANEL - Abnormal; Notable for the following components:      Result Value   CO2 21 (*)    Glucose, Bld 165 (*)    All other components within normal limits  CBC WITH DIFFERENTIAL/PLATELET - Abnormal; Notable for the following components:   Neutro Abs 7.8 (*)    All other components within normal limits  TROPONIN I (HIGH SENSITIVITY) - Abnormal; Notable for the following components:   Troponin I (High Sensitivity) 2,133 (*)    All other components  within normal limits  PROTIME-INR  APTT    EKG EKG Interpretation  Date/Time:  Friday April 16 2022 09:24:54 EDT Ventricular Rate:  74 PR Interval:  152 QRS Duration: 93 QT Interval:  372 QTC Calculation: 413 R Axis:   83 Text Interpretation: Sinus rhythm Abnormal T, consider  ischemia, lateral leads No significant change since last tracing Confirmed by Jacalyn Lefevre [id number]) on 04/16/2022 9:33:39 AM  Radiology DG Chest Port 1 View  Result Date: 04/16/2022 CLINICAL DATA:  46 year old male with history of chest pain. EXAM: PORTABLE CHEST 1 VIEW COMPARISON:  Chest x-ray 04/13/2022. FINDINGS: Lung volumes are normal. No consolidative airspace disease. No pleural effusions. No pneumothorax. No pulmonary nodule or mass noted. Pulmonary vasculature and the cardiomediastinal silhouette are within normal limits. IMPRESSION: No radiographic evidence of acute cardiopulmonary disease. Electronically Signed   By: Trudie Reed M.D.   On: 04/16/2022 10:02   ECHOCARDIOGRAM COMPLETE  Result Date: 04/15/2022    ECHOCARDIOGRAM REPORT   Patient Name:   Dave [name] Date of Exam: 04/15/2022 Medical Rec #:  [id number]          Height:       73.0 in Accession #:    [id number]         Weight:       223.0 lb Date of Birth:  August 22, 1976          BSA:          2.254 m Patient Age:    45 years           BP:           146/75 mmHg Patient Gender: M                  HR:           59 bpm. Exam Location:  Inpatient Procedure: 2D Echo, Cardiac Doppler and Color Doppler Indications:    NSTEMI  History:        Patient has no prior history of Echocardiogram examinations.  Sonographer:    Cleatis Polka Referring Phys: [id number] MUHAMMAD A ARIDA IMPRESSIONS  1. Left ventricular ejection fraction, by estimation, is 55%. The left ventricle has normal function. The left ventricle has no regional wall motion abnormalities. Left ventricular diastolic parameters were normal.  2. Right ventricular systolic function is normal. The right  ventricular size is normal. Tricuspid regurgitation signal is inadequate for assessing PA pressure.  3. The mitral valve is normal in structure. Trivial mitral valve regurgitation. No evidence of mitral stenosis.  4. The aortic valve is tricuspid. There is mild calcification of the aortic valve. Aortic valve regurgitation is not visualized. No aortic stenosis is present.  5. The inferior vena cava is normal in size with <50% respiratory variability, suggesting right atrial pressure of 8 mmHg. FINDINGS  Left Ventricle: Left ventricular ejection fraction, by estimation, is 55%. The left ventricle has normal function. The left ventricle has no regional wall motion abnormalities. The left ventricular internal cavity size was normal in size. There is no left ventricular hypertrophy. Left ventricular diastolic parameters were normal. Right Ventricle: The right ventricular size is normal. No increase in right ventricular wall thickness. Right ventricular systolic function is normal. Tricuspid regurgitation signal is inadequate for assessing PA pressure. Left Atrium: Left atrial size was normal in size. Right Atrium: Right atrial size was normal in size. Pericardium: There is no evidence of pericardial effusion. Mitral Valve: The mitral valve is normal in structure. Trivial mitral valve regurgitation. No evidence of mitral valve stenosis. Tricuspid Valve: The tricuspid valve is normal in structure. Tricuspid valve regurgitation is trivial. Aortic Valve: The aortic valve is tricuspid. There is mild calcification of the aortic valve. Aortic valve regurgitation is not visualized. No aortic stenosis is present. Aortic valve peak gradient measures 7.4  mmHg. Pulmonic Valve: The pulmonic valve was normal in structure. Pulmonic valve regurgitation is not visualized. Aorta: The aortic root is normal in size and structure. Venous: The inferior vena cava is normal in size with less than 50% respiratory variability, suggesting right  atrial pressure of 8 mmHg. IAS/Shunts: No atrial level shunt detected by color flow Doppler.  LEFT VENTRICLE PLAX 2D LVIDd:         4.50 cm      Diastology LVIDs:         3.30 cm      LV e' medial:    9.01 cm/s LV PW:         1.10 cm      LV E/e' medial:  8.6 LV IVS:        1.00 cm      LV e' lateral:   13.20 cm/s LVOT diam:     2.20 cm      LV E/e' lateral: 5.8 LV SV:         99 LV SV Index:   44 LVOT Area:     3.80 cm  LV Volumes (MOD) LV vol d, MOD A2C: 112.0 ml LV vol d, MOD A4C: 102.0 ml LV vol s, MOD A2C: 47.8 ml LV vol s, MOD A4C: 43.8 ml LV SV MOD A2C:     64.2 ml LV SV MOD A4C:     102.0 ml LV SV MOD BP:      61.4 ml RIGHT VENTRICLE            IVC RV Basal diam:  3.00 cm    IVC diam: 1.90 cm RV Mid diam:    2.50 cm RV S prime:     8.24 cm/s TAPSE (M-mode): 2.0 cm LEFT ATRIUM             Index        RIGHT ATRIUM           Index LA diam:        3.60 cm 1.60 cm/m   RA Area:     13.50 cm LA Vol (A2C):   43.4 ml 19.26 ml/m  RA Volume:   32.60 ml  14.47 ml/m LA Vol (A4C):   37.5 ml 16.64 ml/m LA Biplane Vol: 40.5 ml 17.97 ml/m  AORTIC VALVE AV Area (Vmax): 3.94 cm AV Vmax:        136.00 cm/s AV Peak Grad:   7.4 mmHg LVOT Vmax:      141.00 cm/s LVOT Vmean:     90.200 cm/s LVOT VTI:       0.261 m  AORTA Ao Root diam: 3.30 cm Ao Asc diam:  2.80 cm MITRAL VALVE MV Area (PHT): 3.76 cm    SHUNTS MV Decel Time: 202 msec    Systemic VTI:  0.26 m MV E velocity: 77.10 cm/s  Systemic Diam: 2.20 cm MV A velocity: 45.00 cm/s MV E/A ratio:  1.71 Dalton McleanMD Electronically signed by Wilfred Lacyalton McleanMD Signature Date/Time: 04/15/2022/1:22:18 PM    Final    CARDIAC CATHETERIZATION  Result Date: 04/14/2022   Prox RCA lesion is 30% stenosed.   Dist RCA lesion is 60% stenosed.   Ramus lesion is 95% stenosed.   Mid LAD-1 lesion is 75% stenosed.   Mid LAD-2 lesion is 30% stenosed.   Dist LAD lesion is 20% stenosed.   Prox LAD lesion is 30% stenosed.   A drug-eluting stent was successfully placed using a SYNERGY XD  3.50X16.  Post intervention, there is a 0% residual stenosis. 1.  Significant underlying two-vessel coronary artery disease.  The culprit for non-ST elevation myocardial infarction seems to be severe stenosis of the distal ramus.  The vessel is small in diameter in that area and tortuous distally.  Borderline mid LAD stenosis was significant by flow reserve evaluation with an RFR ratio of 0.88.  Moderate distal RCA stenosis. 2.  Left ventricular angiography was not performed to minimize contrast use.  We will obtain an echocardiogram.  Normal left ventricular end-diastolic pressure. 3.  Successful drug-eluting stent placement to the mid LAD. Recommendations: Dual antiplatelet therapy for at least 1 year. Aggressive treatment of risk factors. The distal ramus is too small to stent.    Procedures Procedures    Medications Ordered in ED Medications - No data to display  ED Course/ Medical Decision Making/ A&P                           Medical Decision Making Amount and/or Complexity of Data Reviewed Labs: ordered. Radiology: ordered.   Dave [name] is a 46 y.o. male presents to the ED for concern of chest pain, this involves an extensive number of treatment options, and is a complaint that carries with it a high risk of complications and morbidity.  The differential diagnosis includes occlusion of recent stent, ACS, stable angina, Dressler syndrome, pericarditis, PE   Additional history obtained:  Additional history obtained from chart review External records from outside source obtained and reviewed including records from recent admission including cardiac cath report and labs   Lab Tests:  I Ordered, reviewed, and interpreted labs.  The pertinent results include: No leukocytosis and normal hemoglobin, glucose 165, no other significant electrolyte derangements and normal renal function.  Initial troponin is elevated to 2133 but this is downtrending from 4440 during patient's most  recent admission for NSTEMI with stent placement.   Imaging Studies ordered:  I ordered imaging studies including chest x-ray I independently visualized and interpreted imaging which showed no active cardiopulmonary disease I agree with the radiologist interpretation   Cardiac Monitoring:  The patient was maintained on a cardiac monitor.  I personally viewed and interpreted the cardiac monitored which showed an underlying rhythm of: Sinus rhythm without ST elevation or depression   Medicines ordered and prescription drug management:  I have reviewed the patients home medicines and have made adjustments as needed    ED Course:  Patient discharged yesterday after NSTEMI with stenting to the mid LAD, developed chest pain today after he came back inside from going for short walk, improved after nitro and aspirin.  Upon arrival he is hemodynamically stable and in no acute distress.  Chest pain work-up initiated, EKG upon arrival is nonischemic.  We will also consult cardiology given recent catheterization Labs overall reassuring, troponin elevated at 2000 133 but overall downtrending from recent admission.  Will discuss case with cardiology further recommendations   Consultations Obtained:  I requested consultation with the cardiology,  and discussed lab and imaging findings as well as pertinent plan -Dr. [place] has seen and evaluated patient, feels that this is most certainly not a stent occlusion, pain may be due to hypotension, or potentially arrhythmia given patient's sensation of palpitations before symptoms started.  Patient also has an occlusion of the ramus intermediate artery that was too small for intervention and could be continuing to cause anginal pain.  Dr. [place] does not recommend getting a  delta troponin.  If patient remains chest pain-free and is able to ambulate without worsening chest pain or syncopal symptoms patient can be discharged home.  Dr. [place] recommends some  changes to patient's blood pressure medications as noted in his consult note and they will place a Zio patch on the patient prior to discharge home.   Reevaluation:  After the interventions noted above, I reevaluated the patient and found that they have :improved.  Patient ambulated in the department with no further chest pain.  Zio patch monitor is in place.   Dispostion:  After consideration of the diagnostic results and the patients response to treatment feel that the patent would benefit from discharge home with close outpatient follow-up with cardiology.  Given soft blood pressures upon arrival cardiology recommends having patient hold his metoprolol tonight, and then half his amlodipine dose so he is taking 2.5 mg daily and then resume his normal metoprolol regimen.         Final Clinical Impression(s) / ED Diagnoses Final diagnoses:  NSTEMI (non-ST elevated myocardial infarction) [facility])  Palpitations    Rx / DC Orders ED Discharge Orders          Ordered    LONG TERM MONITOR-LIVE TELEMETRY (3-14 DAYS)        04/16/22 1142              Dartha Lodge, PA-C 04/16/22 1646    Jacalyn Lefevre, MD 04/17/22 1125

## 2022-04-16 NOTE — ED Triage Notes (Signed)
Pt arrived to Ed via [facility] w/ c/o CP after walking for 5 mins, denies radiation, endorses feeling clammy and lightheaded. Pt was just discharged yesterday after having a NSTEMI and heart cath. EMS gave 1 SL nitro, 324mg  ASA w/ minimal pain relief. VSS w/ EMS. Depression noted on EKG. 20g R FA

## 2022-04-16 NOTE — Consult Note (Signed)
Cardiology Consultation:   Patient ID: Dave [name] MRN: [id number]; DOB: Apr 11, 1976  Admit date: 04/16/2022 Date of Consult: 04/16/2022  PCP:  Lucinda Dell, MD   [facility] Providers Cardiologist:  Peter Martinique, MD      Patient Profile:   Dave [name] is a 46 y.o. male with a hx of renal transplant, HTN, GERD, possible PAF, and CAD with recent PCI/DES to LAD who is being seen 04/16/2022 for the evaluation of chest pain at the request of Dr. Gilford Raid.  History of Present Illness:   Dave [name] has a history of renal failure and is s/p renal transplant 2015, hypertension, GERD, possible PAF, and CAD with recent PCI.  Nuclear stress test 2010 with no evidence of ischemia.  He has been followed by [facility] since his renal transplant in 2015.  Echocardiogram 2014 with an EF greater than 55% and no significant valvular disease.  He was seen by cardiology 04/2017 for possible atrial fibrillation.  He reportedly was seen in the ER for palpitations and diagnosed with A-fib.  He was placed on aspirin.  Follow-up heart monitor did not show any episodes of A-fib.  Aspirin was continued for a low CHA2DS2-VASc score of 1.  He suffered an NSTEMI on 04/13/2022 prompting ER evaluation.  Heart catheterization on 04/14/2022 showed severe two-vessel CAD.  The culprit lesion for his NSTEMI felt to be severe stenosis of the distal ramus.  However this vessel was small diameter, torturous distally, and not amenable to PCI.  Patient did have 75% stenosis in the mid LAD that was significant by RFR 0.88.  This was treated with 3.50 x 16 mm DES.  He tolerated the procedure well and was placed on aspirin and Brilinta, statin, beta-blocker, and amlodipine.  Renal function was stable at the time of discharge.  He was discharged on 04/15/2022.  He presented back to [facility] ED 04/16/2022 with chest pain. Cardiology was consulted. He reports taking brilinta last  night and this morning. After waking up he went for a 5 min  walk up the street and back and felt fine. He returned home and ate breakfast. About 30 min later he experienced palpitations, clammy feeling, and chest pain similar in quality but less intense than his recent angina related to his NSTEMI treated with DES-LAD. He took one nitro SL and called EMS. They advised 324 mg ASA. He arrived via [facility] and was chest pain free. He remains chest pain free and was found sleeping in bed.   Past Medical History:  Diagnosis Date   Asthma    Atrial fibrillation (HCC)    uses ASA and fish oil    Depression    GERD (gastroesophageal reflux disease)    Hx of gout    Hyperlipidemia    Hypertension    Kidney failure    NSTEMI (non-ST elevated myocardial infarction) ([place]) 04/14/2022   Renal insufficiency     Past Surgical History:  Procedure Laterality Date   CORONARY STENT INTERVENTION N/A 04/14/2022   Procedure: CORONARY STENT INTERVENTION;  Surgeon: Wellington Hampshire, MD;  Location: [facility];  Service: Cardiovascular;  Laterality: N/A;   CYST EXCISION Left 06/11/2016   Procedure: CYST REMOVAL;  Surgeon: Beverly Gust, MD;  Location: [place];  Service: ENT;  Laterality: Left;  Left upper forehead   DIALYSIS FISTULA CREATION  2010   INTRAVASCULAR PRESSURE WIRE/FFR STUDY N/A 04/14/2022   Procedure: INTRAVASCULAR PRESSURE WIRE/FFR STUDY;  Surgeon: Wellington Hampshire, MD;  Location: [facility];  Service: Cardiovascular;  Laterality: N/A;   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   LEFT HEART CATH AND CORONARY ANGIOGRAPHY N/A 04/14/2022   Procedure: LEFT HEART CATH AND CORONARY ANGIOGRAPHY;  Surgeon: [name], MD;  Location: [facility];  Service: Cardiovascular;  Laterality: N/A;     Home Medications:  Prior to Admission medications   Medication Sig Start Date End Date Taking? Authorizing Provider  amLODipine (NORVASC) 5 MG tablet Take 1 tablet (5 mg total) by mouth daily. 04/16/22   Arty Baumgartner, NP  aspirin 81  MG tablet Take 81 mg by mouth daily.    [provider]  atorvastatin (LIPITOR) 80 MG tablet Take 1 tablet (80 mg total) by mouth daily. 04/16/22   Arty Baumgartner, NP  Cholecalciferol (VITAMIN D) 1000 UNITS capsule Take 1,000 Units by mouth daily.    [provider]  metoprolol tartrate (LOPRESSOR) 25 MG tablet Take 0.5 tablets (12.5 mg total) by mouth 2 (two) times daily. 04/15/22   Arty Baumgartner, NP  nitroGLYCERIN (NITROSTAT) 0.4 MG SL tablet Place 1 tablet (0.4 mg total) under the tongue every 5 (five) minutes x 3 doses as needed for chest pain. 04/15/22   Arty Baumgartner, NP  predniSONE (DELTASONE) 5 MG tablet Take 5 mg by mouth daily with breakfast.    [provider]  Tacrolimus (ENVARSUS XR PO) Take 5 tablets by mouth daily before breakfast.    [provider]  ticagrelor (BRILINTA) 90 MG TABS tablet Take 1 tablet (90 mg total) by mouth 2 (two) times daily. 04/15/22   Arty Baumgartner, NP    Inpatient Medications: Scheduled Meds:  Continuous Infusions:  PRN Meds:   Allergies:    Allergies  Allergen Reactions   Ace Inhibitors Nausea Only    Social History:   Social History   Socioeconomic History   Marital status: Married    Spouse name: Not on file   Number of children: Not on file   Years of education: Not on file   Highest education level: Not on file  Occupational History   Not on file  Tobacco Use   Smoking status: Former    Packs/day: 0.50    Years: 7.00    Total pack years: 3.50    Types: Cigarettes    Quit date: 05/21/2009    Years since quitting: 12.9   Smokeless tobacco: Never  Substance and Sexual Activity   Alcohol use: No   Drug use: Not on file    Comment: Smoked marijuana for about 10-12 years; quit 2010.   Sexual activity: Not on file  Other Topics Concern   Not on file  Social History Narrative   Not on file   Social Determinants of Health   Financial Resource Strain: Not on file  Food  Insecurity: Not on file  Transportation Needs: Not on file  Physical Activity: Not on file  Stress: Not on file  Social Connections: Not on file  Intimate Partner Violence: Not on file    Family History:    Family History  Problem Relation Age of Onset   Hypertension Father    Hyperlipidemia Father    Heart disease Father        CABG     ROS:  Please see the history of present illness.   All other ROS reviewed and negative.     Physical Exam/Data:   Vitals:   04/16/22 7829 04/16/22 0930 04/16/22 5621  04/16/22 0941  BP:  110/72    Pulse:  69    Resp:  (!) 23    Temp:   98.5 F (36.9 C)   TempSrc:   Oral   SpO2: 97% 98%    Weight:    99.8 kg  Height:    6\' 1"  (1.854 m)   No intake or output data in the 24 hours ending 04/16/22 1000    04/16/2022    9:41 AM 04/13/2022   11:06 PM 06/11/2016    8:36 AM  Last 3 Weights  Weight (lbs) 220 lb 223 lb 246 lb  Weight (kg) 99.791 kg 101.152 kg 111.585 kg     Body mass index is 29.03 kg/m.  General:  Well nourished, well developed, in no acute distress HEENT: normal Neck: no JVD Vascular: No carotid bruits; Distal pulses 2+ bilaterally Cardiac:  normal S1, S2; RRR; no murmur  Lungs:  clear to auscultation bilaterally, no wheezing, rhonchi or rales  Abd: soft, nontender, no hepatomegaly  Ext: no edema Musculoskeletal:  No deformities, BUE and BLE strength normal and equal Skin: warm and dry  Neuro:  CNs 2-12 intact, no focal abnormalities noted Psych:  Normal affect   EKG:  The EKG was personally reviewed and demonstrates:  sinus rhythm with HR 74, T wave abnormalities similar to prior Telemetry:  Telemetry was personally reviewed and demonstrates:  sinus rhythm in the 70s  Relevant CV Studies:  Echo 04/15/22:  1. Left ventricular ejection fraction, by estimation, is 55%. The left  ventricle has normal function. The left ventricle has no regional wall  motion abnormalities. Left ventricular diastolic parameters were  normal.   2. Right ventricular systolic function is normal. The right ventricular  size is normal. Tricuspid regurgitation signal is inadequate for assessing  PA pressure.   3. The mitral valve is normal in structure. Trivial mitral valve  regurgitation. No evidence of mitral stenosis.   4. The aortic valve is tricuspid. There is mild calcification of the  aortic valve. Aortic valve regurgitation is not visualized. No aortic  stenosis is present.   5. The inferior vena cava is normal in size with <50% respiratory  variability, suggesting right atrial pressure of 8 mmHg.    Left heart cath 04/14/22:   Prox RCA lesion is 30% stenosed.   Dist RCA lesion is 60% stenosed.   Ramus lesion is 95% stenosed.   Mid LAD-1 lesion is 75% stenosed.   Mid LAD-2 lesion is 30% stenosed.   Dist LAD lesion is 20% stenosed.   Prox LAD lesion is 30% stenosed.   A drug-eluting stent was successfully placed using a SYNERGY XD 3.50X16.   Post intervention, there is a 0% residual stenosis.   1.  Significant underlying two-vessel coronary artery disease.  The culprit for non-ST elevation myocardial infarction seems to be severe stenosis of the distal ramus.  The vessel is small in diameter in that area and tortuous distally.  Borderline mid LAD stenosis was significant by flow reserve evaluation with an RFR ratio of 0.88.  Moderate distal RCA stenosis. 2.  Left ventricular angiography was not performed to minimize contrast use.  We will obtain an echocardiogram.  Normal left ventricular end-diastolic pressure. 3.  Successful drug-eluting stent placement to the mid LAD.   Recommendations: Dual antiplatelet therapy for at least 1 year. Aggressive treatment of risk factors. The distal ramus is too small to stent.  Laboratory Data:  High Sensitivity Troponin:   Recent Labs  Lab 04/13/22 2306 04/14/22 0131 04/14/22 0743  TROPONINIHS 6 70* 4,440*     Chemistry Recent Labs  Lab 04/13/22 2306  04/15/22 0207  NA 137 138  K 3.7 4.0  CL 105 107  CO2 24 24  GLUCOSE 149* 118*  BUN 11 7  CREATININE 0.99 0.77  CALCIUM 9.4 9.5  GFRNONAA >60 >60  ANIONGAP 8 7    No results for input(s): "PROT", "ALBUMIN", "AST", "ALT", "ALKPHOS", "BILITOT" in the last 168 hours. Lipids  Recent Labs  Lab 04/15/22 0207  CHOL 188  TRIG 91  HDL 37*  LDLCALC 133*  CHOLHDL 5.1    Hematology Recent Labs  Lab 04/13/22 2306 04/15/22 0207  WBC 11.7* 12.8*  RBC 4.89 4.90  HGB 14.7 14.8  HCT 45.0 44.2  MCV 92.0 90.2  MCH 30.1 30.2  MCHC 32.7 33.5  RDW 13.6 13.4  PLT 255 244   Thyroid No results for input(s): "TSH", "FREET4" in the last 168 hours.  BNPNo results for input(s): "BNP", "PROBNP" in the last 168 hours.  DDimer No results for input(s): "DDIMER" in the last 168 hours.   Radiology/Studies:  ECHOCARDIOGRAM COMPLETE  Result Date: 04/15/2022    ECHOCARDIOGRAM REPORT   Patient Name:   Dave [name] Date of Exam: 04/15/2022 Medical Rec #:  [id number]          Height:       73.0 in Accession #:    [id number]         Weight:       223.0 lb Date of Birth:  June 18, 1976          BSA:          2.254 m Patient Age:    45 years           BP:           146/75 mmHg Patient Gender: M                  HR:           59 bpm. Exam Location:  Inpatient Procedure: 2D Echo, Cardiac Doppler and Color Doppler Indications:    NSTEMI  History:        Patient has no prior history of Echocardiogram examinations.  Sonographer:    Cleatis Polka Referring Phys: [id number] MUHAMMAD A ARIDA IMPRESSIONS  1. Left ventricular ejection fraction, by estimation, is 55%. The left ventricle has normal function. The left ventricle has no regional wall motion abnormalities. Left ventricular diastolic parameters were normal.  2. Right ventricular systolic function is normal. The right ventricular size is normal. Tricuspid regurgitation signal is inadequate for assessing PA pressure.  3. The mitral valve is normal in structure. Trivial  mitral valve regurgitation. No evidence of mitral stenosis.  4. The aortic valve is tricuspid. There is mild calcification of the aortic valve. Aortic valve regurgitation is not visualized. No aortic stenosis is present.  5. The inferior vena cava is normal in size with <50% respiratory variability, suggesting right atrial pressure of 8 mmHg. FINDINGS  Left Ventricle: Left ventricular ejection fraction, by estimation, is 55%. The left ventricle has normal function. The left ventricle has no regional wall motion abnormalities. The left ventricular internal cavity size was normal in size. There is no left ventricular hypertrophy. Left ventricular diastolic parameters were normal. Right Ventricle: The right ventricular size is normal. No increase in right ventricular wall thickness. Right ventricular systolic function is normal. Tricuspid regurgitation signal is inadequate for  assessing PA pressure. Left Atrium: Left atrial size was normal in size. Right Atrium: Right atrial size was normal in size. Pericardium: There is no evidence of pericardial effusion. Mitral Valve: The mitral valve is normal in structure. Trivial mitral valve regurgitation. No evidence of mitral valve stenosis. Tricuspid Valve: The tricuspid valve is normal in structure. Tricuspid valve regurgitation is trivial. Aortic Valve: The aortic valve is tricuspid. There is mild calcification of the aortic valve. Aortic valve regurgitation is not visualized. No aortic stenosis is present. Aortic valve peak gradient measures 7.4 mmHg. Pulmonic Valve: The pulmonic valve was normal in structure. Pulmonic valve regurgitation is not visualized. Aorta: The aortic root is normal in size and structure. Venous: The inferior vena cava is normal in size with less than 50% respiratory variability, suggesting right atrial pressure of 8 mmHg. IAS/Shunts: No atrial level shunt detected by color flow Doppler.  LEFT VENTRICLE PLAX 2D LVIDd:         4.50 cm      Diastology  LVIDs:         3.30 cm      LV e' medial:    9.01 cm/s LV PW:         1.10 cm      LV E/e' medial:  8.6 LV IVS:        1.00 cm      LV e' lateral:   13.20 cm/s LVOT diam:     2.20 cm      LV E/e' lateral: 5.8 LV SV:         99 LV SV Index:   44 LVOT Area:     3.80 cm  LV Volumes (MOD) LV vol d, MOD A2C: 112.0 ml LV vol d, MOD A4C: 102.0 ml LV vol s, MOD A2C: 47.8 ml LV vol s, MOD A4C: 43.8 ml LV SV MOD A2C:     64.2 ml LV SV MOD A4C:     102.0 ml LV SV MOD BP:      61.4 ml RIGHT VENTRICLE            IVC RV Basal diam:  3.00 cm    IVC diam: 1.90 cm RV Mid diam:    2.50 cm RV S prime:     8.24 cm/s TAPSE (M-mode): 2.0 cm LEFT ATRIUM             Index        RIGHT ATRIUM           Index LA diam:        3.60 cm 1.60 cm/m   RA Area:     13.50 cm LA Vol (A2C):   43.4 ml 19.26 ml/m  RA Volume:   32.60 ml  14.47 ml/m LA Vol (A4C):   37.5 ml 16.64 ml/m LA Biplane Vol: 40.5 ml 17.97 ml/m  AORTIC VALVE AV Area (Vmax): 3.94 cm AV Vmax:        136.00 cm/s AV Peak Grad:   7.4 mmHg LVOT Vmax:      141.00 cm/s LVOT Vmean:     90.200 cm/s LVOT VTI:       0.261 m  AORTA Ao Root diam: 3.30 cm Ao Asc diam:  2.80 cm MITRAL VALVE MV Area (PHT): 3.76 cm    SHUNTS MV Decel Time: 202 msec    Systemic VTI:  0.26 m MV E velocity: 77.10 cm/s  Systemic Diam: 2.20 cm MV A velocity: 45.00 cm/s MV E/A ratio:  1.71  [employer] Electronically signed by Franki Monte Signature Date/Time: 04/15/2022/1:22:18 PM    Final    CARDIAC CATHETERIZATION  Result Date: 04/14/2022   Prox RCA lesion is 30% stenosed.   Dist RCA lesion is 60% stenosed.   Ramus lesion is 95% stenosed.   Mid LAD-1 lesion is 75% stenosed.   Mid LAD-2 lesion is 30% stenosed.   Dist LAD lesion is 20% stenosed.   Prox LAD lesion is 30% stenosed.   A drug-eluting stent was successfully placed using a SYNERGY XD 3.50X16.   Post intervention, there is a 0% residual stenosis. 1.  Significant underlying two-vessel coronary artery disease.  The culprit for non-ST elevation  myocardial infarction seems to be severe stenosis of the distal ramus.  The vessel is small in diameter in that area and tortuous distally.  Borderline mid LAD stenosis was significant by flow reserve evaluation with an RFR ratio of 0.88.  Moderate distal RCA stenosis. 2.  Left ventricular angiography was not performed to minimize contrast use.  We will obtain an echocardiogram.  Normal left ventricular end-diastolic pressure. 3.  Successful drug-eluting stent placement to the mid LAD. Recommendations: Dual antiplatelet therapy for at least 1 year. Aggressive treatment of risk factors. The distal ramus is too small to stent.   DG Chest 2 View  Result Date: 04/13/2022 CLINICAL DATA:  Chest pain EXAM: CHEST - 2 VIEW COMPARISON:  08/19/2014 FINDINGS: Lungs are clear.  No pleural effusion or pneumothorax. The heart is normal in size. Visualized osseous structures are within normal limits. IMPRESSION: Normal chest radiographs. Electronically Signed   By: Julian Hy M.D.   On: 04/13/2022 23:19     Assessment and Plan:   Chest pain CP concerning for angina. HST pending.  EKG nonischemic. CP relieved with nitro, less severe compared to prior chest pain. No room to titrate anti-anginals.    NSTEMI Recent heart cath 04/14/22 with significant 2 vessel disease 75% lesion in the mid-LAD.  Continue ASA and brilinta, BB, statin, amlodipine.    Palpitations Sounds as though symptoms started with palpitations. Does have a questionable history of PAF. Will place live zio monitor at discharge if telemetry is normal.    Hyperlipidemia with LDL goal < 70 04/15/2022: Cholesterol 188; HDL 37; LDL Cholesterol 133; Triglycerides 91; VLDL 18 Continue 80 mg lipitor Will need to recheck labs in 6-8 weeks.   Hx of renal transplant Continue home medications    Risk Assessment/Risk Scores:       For questions or updates, please contact [facility] Please consult [email or web address] for contact info under     Signed, Ledora Bottcher, PA  04/16/2022 10:00 AM

## 2022-04-16 NOTE — Research (Signed)
Spoke to patient about EVOLVE study. Patient seem interested told him we would contact him when is LP(a) result came back.      [place] Teneka Malmberg, Research Coordinator 04/15/2022 at 12:00 p.m.

## 2022-04-16 NOTE — Discharge Instructions (Signed)
Hold metoprolol this pm. Tomorrow reduce amlodipine to 2.5 mg daily and resume metoprolol at prior dose. Continue other medications  Keep heart monitor in place for 14 days as instructed.  Follow-up with your cardiologist as planned.  If you have return of chest pain use nitro as prescribed and call your cardiologist, return to the emergency department if symptoms or worsening or not improving.

## 2022-04-16 NOTE — Unmapped (Signed)
[facility] to hospital admission note.

## 2022-04-17 NOTE — Progress Notes (Deleted)
Cardiology Office Note:    Date:  04/17/2022   ID:  Dave, [name] 21-Apr-1976, MRN [id number]  PCP:  [name], MD  Cardiologist:  Peter Swaziland, MD  Electrophysiologist:  None   Referring MD: [name], MD   Chief Complaint: hospital follow-up of NSTEMI  History of Present Illness:    Dave [name] is a 46 y.o. male with a history of CAD with recent NSTEMI s/p DES to mid LAD on 04/14/2022, possible atrial fibrillation not on anticoagulation,  hypertension, hyperlipidemia, GERD, and s/p renal transplant in 2015 on Tracolimus and Prednisone who is followed by Dr. Swaziland and presents today for hospital follow-up of NSTEMI.  Patient previous followed by [facility] following his renal transplant in 2015. He had a nuclear stress test in 2010 which showed no evidence of ischemia. Echo in 2014 showed LVEF of >55% with no significant valvular disease. He was seen by Cardiology in 04/2017 in regards to concerns for possible atrial fibrillation after an ED visit for palpitations. He was reportedly told he had atrial fibrillation in the ED and was placed on Aspirin. He was seen in the A.Fib Clinic following this and Zio monitor was placed which did not show any evidence of atrial fibrillation. He was continued on Aspirin as his CHA2DS2-VASc score was 1.   He was recently admitted at [facility] from 04/13/2022 to 04/15/2022 for a NSTEMI after presenting with chest pain and palpitations. EKG showed T wave inversions in lead [name] and aVR and biphasic T waves in aVF. High-sensitivity troponin peaked at 4,440. LHC showed 95% stenosis of the distal ramus (culprit lesion), 20% stenosis of the proximal LAD, 75% stenosis followed by 30% stenosis of the mid LAD, 20% stenosis of distal LAD, 30% stenosis of proximal RCA, and 60% stenosis of distal RCA. The borderline mid LAD stenosis was significant by flow reserve evaluation with an RFR ratio of 0.88. He underwent successful DES to this LAD  lesion. The distal ramus lesion was too small for PCI. Echo showed LVEF of 55% with normal wall motion and diastolic parameters and no significant valvular disease. He was started on DAPT with Aspirin and Brilinta as well as a high-intensity statin. He was discharged on 04/15/2022 but then presented back to the ED the following day for palpitations with associated chest pain that occurred about 30 minutes after returning from a short walk (no symptoms during walk). Symptoms were similar in quality but less intense than the one he had before PCI. He took one dose of sublingual Nitro and called EMS. By the time he arrived to the ED, he was chest pain free. EKG showed normal sinus rhythm no acute changes. High-sensitivity troponin was trending down from recent admission. Patient was able to ambulate in the ED without any problems and was felt to be stable for discharge. Live Zio monitor was placed prior to discharge. Home Amlodipine was decreased and evening dose of Metoprolol was held due to soft BP.  Patient presents today for follow-up. ***  CAD with Recent NSTEMI Patient recent admitted with NSTEMI s/p DES to mid LAD. Culprit lesion was felt to be a severe stenosis in the distal ramus but this was too small for PCI. He presented back to the ED one day after discharge for an episode of palpitations and chest pain but work-up was unremarkable and he was discharged. - No recurrent angina. ** - Continue DAPT with Aspirin and Brilinta. ** - Continue beta-blocker and  high-intensity statin. - Increase activity as tolerated. Recommend Cardiac Rehab.   Palpitations Possible Atrial Fibrillation Patient has a questionable history of atrial fibrillation and both recent presentations to the ED were due to chest pain and palpitations. Live Zio monitor placed during last ED visit on 04/16/2022 and showed ***. - Continue Lopressor 12.5mg  twice daily. - CHA2DS2-VASc = 2 (CAD, HTN). Not started on anticoagulation during  recent admission due to questionable history. ***  Hypertension BP well controlled *** - Continue Amlodipine 2.5mg  daily *** and Lopressor 12.5mg  twice daily.  Hyperlipidemia Lipid panel during recent admission: Total Cholesterol 188, Triglycerides 91, HDL 37, LDL 133. - Started on Lipitor 80mg  daily during admission. Continue. - Will recheck lipid panel and LFTs in 6-8 weeks.   S/p Renal Transplant in 2015 - On Tracrolimus and Prednisone.    Past Medical History:  Diagnosis Date   Asthma    Atrial fibrillation (HCC)    uses ASA and fish oil    Depression    GERD (gastroesophageal reflux disease)    Hx of gout    Hyperlipidemia    Hypertension    Kidney failure    NSTEMI (non-ST elevated myocardial infarction) (HCC) 04/14/2022   Renal insufficiency     Past Surgical History:  Procedure Laterality Date   CORONARY STENT INTERVENTION N/A 04/14/2022   Procedure: CORONARY STENT INTERVENTION;  Surgeon: 04/16/2022, MD;  Location: [facility];  Service: Cardiovascular;  Laterality: N/A;   CYST EXCISION Left 06/11/2016   Procedure: CYST REMOVAL;  Surgeon: 06/13/2016, MD;  Location: [facility];  Service: ENT;  Laterality: Left;  Left upper forehead   DIALYSIS FISTULA CREATION  2010   INTRAVASCULAR PRESSURE WIRE/FFR STUDY N/A 04/14/2022   Procedure: INTRAVASCULAR PRESSURE WIRE/FFR STUDY;  Surgeon: 04/16/2022, MD;  Location: [facility];  Service: Cardiovascular;  Laterality: N/A;   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   LEFT HEART CATH AND CORONARY ANGIOGRAPHY N/A 04/14/2022   Procedure: LEFT HEART CATH AND CORONARY ANGIOGRAPHY;  Surgeon: 04/16/2022, MD;  Location: [facility];  Service: Cardiovascular;  Laterality: N/A;    Current Medications: No outpatient medications have been marked as taking for the 04/28/22 encounter (Appointment) with 06/28/22, PA-C.     Allergies:   Ace inhibitors   Social History    Socioeconomic History   Marital status: Married    Spouse name: Not on file   Number of children: Not on file   Years of education: Not on file   Highest education level: Not on file  Occupational History   Not on file  Tobacco Use   Smoking status: Former    Packs/day: 0.50    Years: 7.00    Total pack years: 3.50    Types: Cigarettes    Quit date: 05/21/2009    Years since quitting: 12.9   Smokeless tobacco: Never  Substance and Sexual Activity   Alcohol use: No   Drug use: Not on file    Comment: Smoked marijuana for about 10-12 years; quit 2010.   Sexual activity: Not on file  Other Topics Concern   Not on file  Social History Narrative   Not on file   Social Determinants of Health   Financial Resource Strain: Not on file  Food Insecurity: Not on file  Transportation Needs: Not on file  Physical Activity: Not on file  Stress: Not on file  Social Connections: Not on file  Family History: The patient's family history includes Heart disease in his father; Hyperlipidemia in his father; Hypertension in his father.  ROS:   Please see the history of present illness.     EKGs/Labs/Other Studies Reviewed:    The following studies were reviewed today:  Left Cardiac Catheterization 04/14/2022:   Prox RCA lesion is 30% stenosed.   Dist RCA lesion is 60% stenosed.   Ramus lesion is 95% stenosed.   Mid LAD-1 lesion is 75% stenosed.   Mid LAD-2 lesion is 30% stenosed.   Dist LAD lesion is 20% stenosed.   Prox LAD lesion is 30% stenosed.   A drug-eluting stent was successfully placed using a SYNERGY XD [id number].   Post intervention, there is a 0% residual stenosis.   1.  Significant underlying two-vessel coronary artery disease.  The culprit for non-ST elevation myocardial infarction seems to be severe stenosis of the distal ramus.  The vessel is small in diameter in that area and tortuous distally.  Borderline mid LAD stenosis was significant by flow reserve  evaluation with an RFR ratio of 0.88.  Moderate distal RCA stenosis. 2.  Left ventricular angiography was not performed to minimize contrast use.  We will obtain an echocardiogram.  Normal left ventricular end-diastolic pressure. 3.  Successful drug-eluting stent placement to the mid LAD.   Recommendations: Dual antiplatelet therapy for at least 1 year. Aggressive treatment of risk factors. The distal ramus is too small to stent.  Diagnostic Dominance: Right  Intervention   _______________  Echocardiogram 04/15/2022: Impressions: 1. Left ventricular ejection fraction, by estimation, is 55%. The left  ventricle has normal function. The left ventricle has no regional wall  motion abnormalities. Left ventricular diastolic parameters were normal.   2. Right ventricular systolic function is normal. The right ventricular  size is normal. Tricuspid regurgitation signal is inadequate for assessing  PA pressure.   3. The mitral valve is normal in structure. Trivial mitral valve  regurgitation. No evidence of mitral stenosis.   4. The aortic valve is tricuspid. There is mild calcification of the  aortic valve. Aortic valve regurgitation is not visualized. No aortic  stenosis is present.   5. The inferior vena cava is normal in size with <50% respiratory  variability, suggesting right atrial pressure of 8 mmHg.  _______________  2 Week Zio Monitor 04/16/2022 to ***:  EKG:  EKG ordered today. EKG personally reviewed and demonstrates ***.  Recent Labs: 04/16/2022: BUN 15; Creatinine, Ser 1.08; Hemoglobin 14.5; Platelets 239; Potassium 3.6; Sodium 139  Recent Lipid Panel    Component Value Date/Time   CHOL 188 04/15/2022 0207   CHOL 194 01/17/2015 0950   TRIG 91 04/15/2022 0207   TRIG 120 01/17/2015 0950   HDL 37 (L) 04/15/2022 0207   HDL 40 01/17/2015 0950   CHOLHDL 5.1 04/15/2022 0207   VLDL 18 04/15/2022 0207   VLDL 24 01/17/2015 0950   LDLCALC 133 (H) 04/15/2022 0207   LDLCALC  130 (H) 01/17/2015 0950    Physical Exam:    Vital Signs: There were no vitals taken for this visit.    Wt Readings from Last 3 Encounters:  04/16/22 220 lb (99.8 kg)  04/13/22 223 lb (101.2 kg)  06/11/16 246 lb (111.6 kg)     General: 46 y.o. male in no acute distress. HEENT: Normocephalic and atraumatic. Sclera clear. EOMs intact. Neck: Supple. No carotid bruits. No JVD. Heart: *** RRR. Distinct S1 and S2. No murmurs, gallops, or rubs. Radial and distal  pedal pulses 2+ and equal bilaterally. Lungs: No increased work of breathing. Clear to ausculation bilaterally. No wheezes, rhonchi, or rales.  Abdomen: Soft, non-distended, and non-tender to palpation. Bowel sounds present in all 4 quadrants.  MSK: Normal strength and tone for age. *** Extremities: No lower extremity edema.    Skin: Warm and dry. Neuro: Alert and oriented x3. No focal deficits. Psych: Normal affect. Responds appropriately.   Assessment:    No diagnosis found.  Plan:     Disposition: Follow up in ***   Medication Adjustments/Labs and Tests Ordered: Current medicines are reviewed at length with the patient today.  Concerns regarding medicines are outlined above.  No orders of the defined types were placed in this encounter.  No orders of the defined types were placed in this encounter.   There are no Patient Instructions on file for this visit.   Signed, Darreld Mclean, PA-C  04/17/2022 11:15 AM    [place]

## 2022-04-19 NOTE — Progress Notes (Signed)
Applied at hospital Dr. Swaziland to read.

## 2022-04-19 NOTE — Telephone Encounter (Signed)
Left message to call back  

## 2022-04-19 NOTE — Unmapped (Signed)
[facility] ED consult

## 2022-04-19 NOTE — Unmapped (Signed)
[facility] Clinical Assessment & Refill Coordination Note    Johnny Evans, DOB: September 30, 1975  Phone: [phone] (home)     All above HIPAA information was verified with patient.     Was a Nurse, learning disability used for this call? No    Specialty Medication(s):   Transplant: Prednisone 5mg      Current Outpatient Medications   Medication Sig Dispense Refill   ??? amLODIPine (NORVASC) 5 MG tablet Take 1 tablet (5 mg total) by mouth daily.     ??? aspirin (ECOTRIN) 81 MG tablet Take 81 mg by mouth daily.     ??? atorvastatin (LIPITOR) 80 MG tablet Take 1 tablet (80 mg total) by mouth daily.     ??? cholecalciferol, vitamin D3, (VITAMIN D3) 25 mcg (1,000 unit) capsule Take 2 capsules (2,000 Units total) by mouth daily.     ??? metoprolol tartrate (LOPRESSOR) 25 MG tablet Take 1 tablet (25 mg total) by mouth Two (2) times a day.     ??? nitroglycerin (NITROSTAT) 0.4 MG SL tablet Place 1 tablet (0.4 mg total) under the tongue every five (5) minutes as needed for chest pain. Maximum of 3 doses in 15 minutes.     ??? OMEGA-3/DHA/EPA/FISH OIL (FISH OIL-OMEGA-3 FATTY ACIDS) 300-1,000 mg capsule Take 2 capsules (2 g total) by mouth daily. 90 capsule 0   ??? predniSONE (DELTASONE) 5 MG tablet Take 1 tablet (5 mg total) by mouth daily. 30 tablet 11   ??? sertraline (ZOLOFT) 100 MG tablet TAKE 1 TABLET BY MOUTH EVERY DAY 30 tablet 6   ??? tacrolimus (ENVARSUS XR) 1 mg Tb24 extended release tablet Take 5 tablets (5 mg total) by mouth daily. 150 tablet 11   ??? ticagrelor (BRILINTA) 90 mg Tab Take 1 tablet (90 mg total) by mouth Two (2) times a day.       No current facility-administered medications for this visit.        Changes to medications: Johnny Evans reports starting the following medications: brilinta, norvasc, metoprolol, nitrostat , lipitor    Allergies   Allergen Reactions   ??? Ace Inhibitors      GI upset       Changes to allergies: No    SPECIALTY MEDICATION ADHERENCE     Prednisone 5mg   : 8 days of medicine on hand Medication Adherence    Patient reported X missed doses in the last month: 0  Specialty Medication: prednisone 5mg   Adherence tools used: patient uses a pill box to manage medications          Specialty medication(s) dose(s) confirmed: Regimen is correct and unchanged.     Are there any concerns with adherence? No    Adherence counseling provided? Not needed    CLINICAL MANAGEMENT AND INTERVENTION      Clinical Benefit Assessment:    Do you feel the medicine is effective or helping your condition? Yes    Clinical Benefit counseling provided? Not needed    Adverse Effects Assessment:    Are you experiencing any side effects? No    Are you experiencing difficulty administering your medicine? No    Quality of Life Assessment:         How many days over the past month did your transplant  keep you from your normal activities? For example, brushing your teeth or getting up in the morning. 0    Have you discussed this with your provider? Not needed    Acute Infection Status:  Acute infections noted within Epic:  No active infections  Patient reported infection: None    Therapy Appropriateness:    Is therapy appropriate and patient progressing towards therapeutic goals? Yes, therapy is appropriate and should be continued    DISEASE/MEDICATION-SPECIFIC INFORMATION      N/A    PATIENT SPECIFIC NEEDS     - Does the patient have any physical, cognitive, or cultural barriers? No    - Is the patient high risk? No    - Does the patient require a Care Management Plan? No     SOCIAL DETERMINANTS OF HEALTH     At the [facility], we have learned that life circumstances - like trouble affording food, housing, utilities, or transportation can affect the health of many of our patients.   That is why we wanted to ask: are you currently experiencing any life circumstances that are negatively impacting your health and/or quality of life? No    Social Determinants of Psychologist, prison and probation services Strain: Not on file   Internet Connectivity: Not on file   Food Insecurity: Not on file   Tobacco Use: Not on file   Housing/Utilities: Not on file   Alcohol Use: Not on file   Transportation Needs: Not on file   Substance Use: Not on file   Health Literacy: Not on file   Physical Activity: Not on file   Interpersonal Safety: Not on file   Stress: Not on file   Intimate Partner Violence: Not on file   Depression: Not on file   Social Connections: Not on file       Would you be willing to receive help with any of the needs that you have identified today? Not applicable       SHIPPING     Specialty Medication(s) to be Shipped:   Transplant: Prednisone 5mg     Other medication(s) to be shipped: No additional medications requested for fill at this time     Changes to insurance: No    Delivery Scheduled: Yes, Expected medication delivery date: 04/23/2022.     Medication will be delivered via UPS to the confirmed prescription address in [facility].    The patient will receive a drug information handout for each medication shipped and additional FDA Medication Guides as required.  Verified that patient has previously received a Conservation officer, historic buildings and a Surveyor, mining.    The patient or caregiver noted above participated in the development of this care plan and knows that they can request review of or adjustments to the care plan at any time.      All of the patient's questions and concerns have been addressed.    Johnny Evans   [facility] Specialty Pharmacist

## 2022-04-21 NOTE — Telephone Encounter (Signed)
TOC.  Patient contacted regarding discharge from [facility] on 04/16/22.  Patient understands to follow up with provider C. Goodrich on 04/28/22 at 10 AM at [facility]. Patient understands discharge instructions? Yes Patient understands medications and regiment? Yes Patient understands to bring all medications to this visit? Yes  Ask patient:  Are you enrolled in My Chart: No. When he has the time, he will set it up.  Patient states rt wrist is clear and denies numbness/tingling in rt hand.

## 2022-04-21 NOTE — Unmapped (Signed)
KIDNEY POST-TRANSPLANT ASSESSMENT   Clinical Social Worker Progress Note    Name:Haik Valentina Gu  Date of Birth:07/28/1976  NGE:[id number]    REFERRAL INFORMATION:    [name] is s/p transplant for kidney transplantation . CSW follows up to assess general check-in.    PREFERRED LANGUAGE: English    INTERPRETER UTILIZED: N/A    TRANSPLANT DATE:   05/23/2014 (Kidney)    POST TXP RN COORDINATOR:   Laury Deep [phone]; fax/([phone]) [phone]    SUMMARY:  Rec'd VM from pt on 7/28 and again on 8/2.  Pt wanted to notify this CSW that he is now working as a Hospital doctor for [employer] and was recently hospitalized w/ a mild MI.  He out of work until 8/9 and hopes to return soon after.  Appears to still be in his probationary period at work.  Intends to enroll in benefits, but is not eligible to do so yet.    Agreed to share information w/ [facility].  Expressed hope that he is feeling better soon.  Confirmed contact info for this CSW and TNC.  Pt agreed to call if needed.    Lowella Petties, LCSW, CCTSW  Transplant Case Manager  [facility]  04/21/2022

## 2022-04-22 MED FILL — PREDNISONE 5 MG TABLET: ORAL | 30 days supply | Qty: 30 | Fill #4

## 2022-04-27 NOTE — Progress Notes (Unsigned)
Cardiology Office Note:    Date:  04/28/2022   ID:  Dave [name], Dave [name] [birth year], MRN [id number]  PCP:  Lucinda Dell, MD   [facility] Providers Cardiologist:  Peter Martinique, MD     Referring MD: Lucinda Dell, MD    CC: Here for hospital follow up, one recent episode of orthostatic dizziness    History of Present Illness:    Dave [name] is a 46 y.o. male with a hx of the following:  NSTEMI, s/p DES X 1 mid LAD, 03/2022 HTN Palpitations HLD SHOB Hx of renal transplant '15 A-fib (questionable history)  He has been followed by [facility] health prior to recent hospitalization. History of past palpitations and stated in 2015 after his kidney transplant he was diagnosed with atrial fibrillation in the ED and sent home on aspirin for anticoagulation.  He had palpitations the next day and went to the ED and workup there was negative.  He was sent to the A-fib transition clinic and a Zio patch was ordered and did not show any periods of atrial fibrillation.  Triggered episodes were correlated with periods of PVCs.  He had current similar symptoms at an office visit with [facility] health in 2018, episodes lasted only a few seconds to minutes.  Was only on aspirin at that time given his CHA2DS2-VASc score of 1.  He presented to the ED on April 13, 2022 with chief complaint of chest pain.  Stated this episode started around 8:00 PM the night before while he was resting.  Stated he felt like someone was punching him in the chest and he also felt palpitations with this, and called EMS.  ED lab work showed sodium 137, potassium 3.7 and creatinine 0.9.  Serial troponins were 6 >> 70 >> 4440.  Chest x-ray negative.  EKG showed sinus rhythm, 63 bpm, T wave inversion in lead [name] and aVR, biphasic T wave in aVF.  CBC showed WBC 11.7, hemoglobin 14.7. Left heart cath on April 14, 2022 at [facility] revealed significant underlying two-vessel coronary artery disease.  The culprit  for his heart attack seemed to be severe stenosis of the distal ramus.  Borderline mid LAD stenosis was significant by flow reserve evaluation with an RFR ratio of 0.88.  There was moderate distal RCA stenosis.  Successful drug-eluting stent placed to the mid LAD.  He was to continue on dual antiplatelet therapy for at least a year and continue aggressive treatment of risk factors.  2D echocardiogram showed LVEF of 55%, no regional wall motion abnormality, normal RV function and size, no significant valvular disease.    He present back to the ED with chest pain on April 16, 2022.  Stated he went for a walk for about 5 minutes and came back home to lay down.  Reported feeling a palpitation and then felt intense substernal pressure.  Took 1 SL Nitro and 324 of aspirin prior to paramedic arrival.  Upon evaluation in the ED he stated he felt a faint fluttering sensation, very mild pressure but pain had proved.  Stated this pain felt similar to his recent NSTEMI but stated it was not as severe.  His associated symptoms included shortness of breath, lightheadedness, diaphoresis, cough but denied any syncope.  He is compliant with his medications, including Brilinta.  Denied any nausea, vomiting, or abdominal pain.  Twelve-lead EKG shows sinus rhythm, 74 bpm, with abnormal T wave inversion in lead I, otherwise no acute changes.  Lab work including BMP, coagulation studies, CBC with differential, were all unremarkable.  Troponin was 2133 and is trended down from 4442 2 days prior.  2 view chest x-ray did not reveal any acute cardiopulmonary disease.  Dr. Martinique evaluated him and felt like this was not a stent occlusion.  Stated pain may be most likely due to low blood pressure or palpitations.  Dr. Martinique recommended not getting a delta troponin and stated he should have a Zio patch placed on him prior to discharge home.  Amlodipine was reduced to 2.5 mg daily, metoprolol dose was held that evening, and resume the next  day.  Today he presents for hospital follow up.  He is doing well from a cardiac perspective.  Since his most recent hospital visit on July 28, he has not had any more chest pain, shortness of breath, palpitations, orthopnea, PND, swelling, or significant weight changes.  Denies any bleeding while on Brilinta, or claudication.  Denies any recent alcohol use, tobacco use or illicit drug use.  Says he is a former smoker and used to smoke tobacco and marijuana in the past but denies any recent use.  Tolerating current medications okay however he did state he has had 1 episode of orthostatic dizziness and blood pressure dropped to 123XX123 systolically and felt lightheaded while changing positions. His  home blood pressure medication includes amlodipine 5 mg daily and metoprolol tartrate 12.5 mg twice daily.  Works as an Programmer, multimedia and would like to know when he go back to work and start cardiac rehab.  Follows Dr. Toy Cookey with nephrology.  Stated he wore heart monitor for 5 days and then it accidentally came off and and he returned this by mail.  Waiting for these results to be read.  Past Medical History:  Diagnosis Date   Asthma    Atrial fibrillation (HCC)    uses ASA and fish oil    Depression    GERD (gastroesophageal reflux disease)    Hx of gout    Hyperlipidemia    Hypertension    Kidney failure    NSTEMI (non-ST elevated myocardial infarction) ([place]) 04/14/2022   Renal insufficiency     Past Surgical History:  Procedure Laterality Date   CORONARY STENT INTERVENTION N/A 04/14/2022   Procedure: CORONARY STENT INTERVENTION;  Surgeon: Wellington Hampshire, MD;  Location: [place] [facility];  Service: Cardiovascular;  Laterality: N/A;   CYST EXCISION Left 06/11/2016   Procedure: CYST REMOVAL;  Surgeon: Beverly Gust, MD;  Location: [place];  Service: ENT;  Laterality: Left;  Left upper forehead   DIALYSIS FISTULA CREATION  2010   INTRAVASCULAR PRESSURE WIRE/FFR STUDY N/A  04/14/2022   Procedure: INTRAVASCULAR PRESSURE WIRE/FFR STUDY;  Surgeon: Wellington Hampshire, MD;  Location: [place] [facility];  Service: Cardiovascular;  Laterality: N/A;   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   LEFT HEART CATH AND CORONARY ANGIOGRAPHY N/A 04/14/2022   Procedure: LEFT HEART CATH AND CORONARY ANGIOGRAPHY;  Surgeon: Wellington Hampshire, MD;  Location: [place] [facility];  Service: Cardiovascular;  Laterality: N/A;    Current Medications: Current Meds  Medication Sig   amLODipine (NORVASC) 5 MG tablet Take 1 tablet (5 mg total) by mouth daily.   aspirin 81 MG tablet Take 81 mg by mouth daily.   atorvastatin (LIPITOR) 80 MG tablet Take 1 tablet (80 mg total) by mouth daily.   Cholecalciferol (VITAMIN D) 1000 UNITS capsule Take 1,000 Units by mouth daily.   metoprolol  succinate (TOPROL-XL) 25 MG 24 hr tablet Take 0.5 tablets (12.5 mg total) by mouth daily. Take with or immediately following a meal.   nitroGLYCERIN (NITROSTAT) 0.4 MG SL tablet Place 1 tablet (0.4 mg total) under the tongue every 5 (five) minutes x 3 doses as needed for chest pain.   predniSONE (DELTASONE) 5 MG tablet Take 5 mg by mouth daily with breakfast.   Tacrolimus (ENVARSUS XR PO) Take 5 tablets by mouth daily before breakfast.   ticagrelor (BRILINTA) 90 MG TABS tablet Take 1 tablet (90 mg total) by mouth 2 (two) times daily.   [DISCONTINUED] metoprolol tartrate (LOPRESSOR) 25 MG tablet Take 0.5 tablets (12.5 mg total) by mouth 2 (two) times daily.     Allergies:   Ace inhibitors   Social History   Socioeconomic History   Marital status: Married    Spouse name: Not on file   Number of children: Not on file   Years of education: Not on file   Highest education level: Not on file  Occupational History   Not on file  Tobacco Use   Smoking status: Former    Packs/day: 0.50    Years: 7.00    Total pack years: 3.50    Types: Cigarettes    Quit date: 05/21/2009    Years since quitting: 12.9   Smokeless  tobacco: Never  Substance and Sexual Activity   Alcohol use: No   Drug use: Not on file    Comment: Smoked marijuana for about 10-12 years; quit 2010.   Sexual activity: Not on file  Other Topics Concern   Not on file  Social History Narrative   Not on file   Social Determinants of Health   Financial Resource Strain: Not on file  Food Insecurity: Not on file  Transportation Needs: Not on file  Physical Activity: Not on file  Stress: Not on file  Social Connections: Not on file     Family History: The patient's family history includes Heart disease in his father; Hyperlipidemia in his father; Hypertension in his father.  ROS:   Review of Systems  Constitutional:  Negative for chills, diaphoresis, fever, malaise/fatigue and weight loss.  HENT: Negative.    Eyes: Negative.   Respiratory: Negative.    Cardiovascular: Negative.   Gastrointestinal: Negative.   Genitourinary: Negative.   Musculoskeletal: Negative.   Skin: Negative.   Neurological:  Positive for dizziness.       See HPI.   Endo/Heme/Allergies: Negative.   Psychiatric/Behavioral: Negative.     Please see the history of present illness.    All other systems reviewed and are negative.  EKGs/Labs/Other Studies Reviewed:    The following studies were reviewed today:   EKG:  EKG is ordered today.  The ekg ordered today demonstrates NSR, 70 bpm, with T wave abnormality, hyperacute T wave in lead V1, with T wave inversion in lead 1, similar findings to previous EKG, otherwise no acute changes.   14-day ZIO monitor - Pending  2D echo on April 15, 2022: LVEF is 55%.  Trivial mitral valve regurgitation, no mitral valve stenosis.  Tricuspid aortic valve, there is mild calcification, no aortic valve stenosis.  All other findings normal.  Left heart cath and coronary angiography on April 14, 2022:   Prox RCA lesion is 30% stenosed.   Dist RCA lesion is 60% stenosed.   Ramus lesion is 95% stenosed.   Mid LAD-1  lesion is 75% stenosed.   Mid LAD-2 lesion is 30% stenosed.  Dist LAD lesion is 20% stenosed.   Prox LAD lesion is 30% stenosed.   A drug-eluting stent was successfully placed using a SYNERGY XD 3.50X16.   Post intervention, there is a 0% residual stenosis.   1.  Significant underlying two-vessel coronary artery disease.  The culprit for non-ST elevation myocardial infarction seems to be severe stenosis of the distal ramus.  The vessel is small in diameter in that area and tortuous distally.  Borderline mid LAD stenosis was significant by flow reserve evaluation with an RFR ratio of 0.88.  Moderate distal RCA stenosis. 2.  Left ventricular angiography was not performed to minimize contrast use.  We will obtain an echocardiogram.  Normal left ventricular end-diastolic pressure. 3.  Successful drug-eluting stent placement to the mid LAD.   Recommendations: Dual antiplatelet therapy for at least 1 year. Aggressive treatment of risk factors. The distal ramus is too small to stent.   Recent Labs: 04/16/2022: BUN 15; Creatinine, Ser 1.08; Hemoglobin 14.5; Platelets 239; Potassium 3.6; Sodium 139  Recent Lipid Panel    Component Value Date/Time   CHOL 188 04/15/2022 0207   CHOL 194 01/17/2015 0950   TRIG 91 04/15/2022 0207   TRIG 120 01/17/2015 0950   HDL 37 (L) 04/15/2022 0207   HDL 40 01/17/2015 0950   CHOLHDL 5.1 04/15/2022 0207   VLDL 18 04/15/2022 0207   VLDL 24 01/17/2015 0950   LDLCALC 133 (H) 04/15/2022 0207   LDLCALC 130 (H) 01/17/2015 0950     Risk Assessment/Calculations:    CHADS2-VASc Score = 2 Annual stroke risk = 2.2%      Physical Exam:    VS:  BP 138/72   Pulse 70   Ht 6\' 1"  (1.854 m)   Wt 217 lb 3.2 oz (98.5 kg)   SpO2 97%   BMI 28.66 kg/m     Wt Readings from Last 3 Encounters:  04/28/22 217 lb 3.2 oz (98.5 kg)  04/16/22 220 lb (99.8 kg)  04/13/22 223 lb (101.2 kg)     GEN: Well nourished, well developed in no acute distress HEENT: Normal NECK:  No JVD; No carotid bruits CARDIAC: RRR, no murmurs, rubs, gallops; 2+ peripheral pulses throughout, strong and equal bilaterally.  Right radial site is clean, dry, intact and healing well, no bruising or hematoma noted. RESPIRATORY:  Clear to auscultation without rales, wheezing or rhonchi  ABDOMEN: Soft, non-tender, non-distended MUSCULOSKELETAL:  No edema; No deformity  SKIN: Warm and dry NEUROLOGIC:  Alert and oriented x 3 PSYCHIATRIC:  Normal affect   ASSESSMENT:    1. NSTEMI (non-ST elevated myocardial infarction) (HCC)   2. S/P drug eluting coronary stent placement   3. Coronary artery disease involving coronary bypass graft of native heart without angina pectoris   4. Hyperlipidemia, unspecified hyperlipidemia type   5. Palpitations   6. Essential hypertension, benign   7. Orthostatic dizziness   8. Medication management    PLAN:    In order of problems listed above:  CAD, HLD, recent NSTEMI, status post drug-eluting stent to mid LAD, 03/2022 No sign or symptom of angina or recurrent chest pain since last hospitalization. He is doing well and is cleared to go back to work.  Will write a work note for him.  Okay to start cardiac rehab. Recent LDL was 133 on 04/11/22, started on Lipitor for hyperlipidemia management. Will obtain a fasting lipid panel and liver enzymes in 2 months. Continue DAPT (Brilinta 90 mg PO BID and Aspirin 81 mg PO  daily). Change Metoprolol Lopressor 12.5 mg BID to Metoprolol Succinate 12.5 mg daily. Obtain BMET today.     2. Essential hypertension-chronic, stable 3.  Orthostatic dizziness-acute, improved; with medication management Initial blood pressure today is 138/72 however he states he has not taken any blood pressure medication this morning.  Blood pressure with medication is well-controlled.  Will switch Metoprolol Lopressor 12.5 mg BID to Metoprolol Succinate 12.5 milligrams daily. Discussed to monitor BP at home at least 2 hours after medications and  sitting for 5-10 minutes.  Discussed to log blood pressure for the next 2 weeks and let me know these readings via MyChart.  Had 1 episode of orthostatic dizziness with blood pressure drop to 123XX123 systolically.  Discussed conservative measures including adequate hydration, compression stockings, and changing positions slowly.  If he continues to experience orthostatic dizziness with drop in blood pressure, will consider stopping amlodipine.  Will obtain BMET today.   4. Palpitations - chronic, resolved Has had history of palpitations in the past, apparently had history of A-fib (this is questionable as previous monitor did not reveal A-fib) in the past and CHA2DS2-VASc was low and did not require anticoagulation therapy.  He denies any recent palpitations or any symptoms concerning for A-fib.  CHA2DS2-VASc score today is 2. Twelve-lead EKG is normal sinus rhythm, 70 bpm, T wave abnormality, otherwise no acute findings.  Recently mailed back 5-day monitor as mentioned above and will evaluate this to see if this reveals any A-fib.  May need to consider another an additional 7-day monitor to evaluate for any A-fib.  If this does reveal A-fib, will need to start triple therapy. Will obtain BMET as mentioned above. Change Metoprolol Tartrate 12.5 mg BID to Metoprolol succinate 12.5 mg daily as mentioned above.   5. History of renal transplant - chronic, stable History of renal transplant 2015.  He is closely followed by Dr. Toy Cookey with nephrology.  Will obtain BMET to evaluate kidney function post left heart cath.  6.  Disposition: Follow-up in 3 months or sooner if needed.    Medication Adjustments/Labs and Tests Ordered: Current medicines are reviewed at length with the patient today.  Concerns regarding medicines are outlined above.  Orders Placed This Encounter  Procedures   Basic metabolic panel   Lipid panel   Hepatic function panel   EKG 12-Lead   Meds ordered this encounter  Medications    metoprolol succinate (TOPROL-XL) 25 MG 24 hr tablet    Sig: Take 0.5 tablets (12.5 mg total) by mouth daily. Take with or immediately following a meal.    Dispense:  45 tablet    Refill:  3    Patient Instructions  Medication Instructions:  STOP: Metoprolol tartrate 12.5 mg twice a day START: Metoprolol Succinate 12.5 mg daily  *If you need a refill on your cardiac medications before your next appointment, please call your pharmacy*   Lab Work: Your physician recommends lab work today (BMP), then return in 2 months (fasting lipid, LFT)  If you have labs (blood work) drawn today and your tests are completely normal, you will receive your results only by: [place] (if you have MyChart) OR A paper copy in the mail If you have any lab test that is abnormal or we need to change your treatment, we will call you to review the results.   Testing/Procedures: None ordered today   Follow-Up: At [facility], you and your health needs are our priority.  As part of our continuing mission to  provide you with exceptional heart care, we have created designated Provider Care Teams.  These Care Teams include your primary Cardiologist (physician) and Advanced Practice Providers (APPs -  Physician Assistants and Nurse Practitioners) who all work together to provide you with the care you need, when you need it.  We recommend signing up for the patient portal called "MyChart".  Sign up information is provided on this After Visit Summary.  MyChart is used to connect with patients for Virtual Visits (Telemedicine).  Patients are able to view lab/test results, encounter notes, upcoming appointments, etc.  Non-urgent messages can be sent to your provider as well.   To learn more about what you can do with MyChart, go to [email or web address].    Your next appointment:   3 month(s)  The format for your next appointment:   In Person  Provider:   Peter Swazil[place], MD {  Your physician recommends  the following: Stay well hydrate Wear compression stocking Check blood pressure and keep a log for 2 weeks         Signed, Sharlene Dory, NP  04/28/2022 1:00 PM    [facility]

## 2022-04-28 ENCOUNTER — Ambulatory Visit (INDEPENDENT_AMBULATORY_CARE_PROVIDER_SITE_OTHER): Payer: Self-pay | Admitting: Nurse Practitioner

## 2022-04-28 ENCOUNTER — Encounter: Payer: Self-pay | Admitting: Student

## 2022-04-28 VITALS — BP 138/72 | HR 70 | Ht 73.0 in | Wt 217.2 lb

## 2022-04-28 DIAGNOSIS — R002 Palpitations: Secondary | ICD-10-CM

## 2022-04-28 DIAGNOSIS — Z940 Kidney transplant status: Secondary | ICD-10-CM

## 2022-04-28 DIAGNOSIS — I25810 Atherosclerosis of coronary artery bypass graft(s) without angina pectoris: Secondary | ICD-10-CM

## 2022-04-28 DIAGNOSIS — Z79899 Other long term (current) drug therapy: Secondary | ICD-10-CM

## 2022-04-28 DIAGNOSIS — I214 Non-ST elevation (NSTEMI) myocardial infarction: Secondary | ICD-10-CM

## 2022-04-28 DIAGNOSIS — E785 Hyperlipidemia, unspecified: Secondary | ICD-10-CM

## 2022-04-28 DIAGNOSIS — Z955 Presence of coronary angioplasty implant and graft: Secondary | ICD-10-CM

## 2022-04-28 DIAGNOSIS — I10 Essential (primary) hypertension: Secondary | ICD-10-CM

## 2022-04-28 DIAGNOSIS — R42 Dizziness and giddiness: Secondary | ICD-10-CM

## 2022-04-28 LAB — BASIC METABOLIC PANEL
BUN/Creatinine Ratio: 14 (ref 9–20)
BUN: 15 mg/dL (ref 6–24)
CO2: 26 mmol/L (ref 20–29)
Calcium: 10.4 mg/dL — ABNORMAL HIGH (ref 8.7–10.2)
Chloride: 100 mmol/L (ref 96–106)
Creatinine, Ser: 1.05 mg/dL (ref 0.76–1.27)
Glucose: 97 mg/dL (ref 70–99)
Potassium: 4.2 mmol/L (ref 3.5–5.2)
Sodium: 141 mmol/L (ref 134–144)
eGFR: 88 mL/min/{1.73_m2} (ref >59)

## 2022-04-28 MED ORDER — METOPROLOL SUCCINATE ER 25 MG PO TB24: 12.5000 mg | ORAL_TABLET | Freq: Every day | ORAL | 3 refills | Status: AC

## 2022-04-28 NOTE — Patient Instructions (Addendum)
Medication Instructions:  STOP: Metoprolol tartrate 12.5 mg twice a day START: Metoprolol Succinate 12.5 mg daily  *If you need a refill on your cardiac medications before your next appointment, please call your pharmacy*   Lab Work: Your physician recommends lab work today (BMP), then return in 2 months (fasting lipid, LFT)  If you have labs (blood work) drawn today and your tests are completely normal, you will receive your results only by: MyChart Message (if you have MyChart) OR A paper copy in the mail If you have any lab test that is abnormal or we need to change your treatment, we will call you to review the results.   Testing/Procedures: None ordered today   Follow-Up: At [facility], you and your health needs are our priority.  As part of our continuing mission to provide you with exceptional heart care, we have created designated Provider Care Teams.  These Care Teams include your primary Cardiologist (physician) and Advanced Practice Providers (APPs -  Physician Assistants and Nurse Practitioners) who all work together to provide you with the care you need, when you need it.  We recommend signing up for the patient portal called "MyChart".  Sign up information is provided on this After Visit Summary.  MyChart is used to connect with patients for Virtual Visits (Telemedicine).  Patients are able to view lab/test results, encounter notes, upcoming appointments, etc.  Non-urgent messages can be sent to your provider as well.   To learn more about what you can do with MyChart, go to [email or web address].    Your next appointment:   3 month(s)  The format for your next appointment:   In Person  Provider:   Peter Swaziland, MD {  Your physician recommends the following: Stay well hydrate Wear compression stocking Check blood pressure and keep a log for 2 weeks

## 2022-04-29 ENCOUNTER — Telehealth: Payer: Self-pay | Admitting: Nurse Practitioner

## 2022-04-29 NOTE — Telephone Encounter (Signed)
Called patient on his home phone and unable to reach him.  Left a voicemail to give [place] a call regarding one of his medications.  When I speak with him again, I will ask if he has enough Brilinta samples and if he needs any more.  Will continue to monitor.  Sharlene Dory, NP

## 2022-04-30 ENCOUNTER — Telehealth: Payer: Self-pay | Admitting: Nurse Practitioner

## 2022-04-30 NOTE — Telephone Encounter (Signed)
S/w patient and stated he will need more Brilinta samples. Has about 3 weeks of Brilinta remaining. I have routed this conversation to my CMA, Dorris Fetch, to assist with helping patient obtain Brilinta samples.   He is doing well and denies any concerns or questions.   Sharlene Dory, NP

## 2022-04-30 NOTE — Telephone Encounter (Signed)
Called patient and informed him that 2 bottles of Brilinta will be at the front desk for him to pick up along with an application for the patient assistance program. Patient stated he will pick up the samples and the form on Monday.

## 2022-05-04 ENCOUNTER — Telehealth: Payer: Self-pay | Admitting: Nurse Practitioner

## 2022-05-04 NOTE — Telephone Encounter (Signed)
Patient wants to know if the Brilinta he was suppose to pick up can be mailed to him, as well as the application for patient assistance.  He is having trouble finding a ride to get to the office. Please advise.

## 2022-05-04 NOTE — Telephone Encounter (Signed)
Called patient, advised that we could not mail the medications to him- I could mail him the patient assistance forms, but patient states he would like to just come get it all at once. He will get a ride.  Patient thankful for call back.

## 2022-05-05 NOTE — Unmapped (Signed)
error 

## 2022-05-05 NOTE — Unmapped (Signed)
[facility] returned a phone call to the pt but he was not available. A voicemail message was left requesting a return for any questions, concerns, or assistance needed.

## 2022-05-07 DIAGNOSIS — Z006 Encounter for examination for normal comparison and control in clinical research program: Secondary | ICD-10-CM

## 2022-05-13 ENCOUNTER — Other Ambulatory Visit (HOSPITAL_COMMUNITY): Payer: Self-pay

## 2022-05-19 ENCOUNTER — Other Ambulatory Visit (HOSPITAL_COMMUNITY): Payer: Self-pay

## 2022-05-21 ENCOUNTER — Other Ambulatory Visit (HOSPITAL_COMMUNITY): Payer: Self-pay

## 2022-05-25 ENCOUNTER — Other Ambulatory Visit (HOSPITAL_COMMUNITY): Payer: Self-pay

## 2022-05-27 NOTE — Unmapped (Signed)
[facility] Clinical Assessment & Refill Coordination Note    Johnny Evans, DOB: [birth year]  Phone: [phone] (home)     All above HIPAA information was verified with patient.     Was a Nurse, learning disability used for this call? No    Specialty Medication(s):   Transplant: Prednisone 5mg      Current Outpatient Medications   Medication Sig Dispense Refill   ??? amLODIPine (NORVASC) 5 MG tablet Take 1 tablet (5 mg total) by mouth daily.     ??? aspirin (ECOTRIN) 81 MG tablet Take 81 mg by mouth daily.     ??? atorvastatin (LIPITOR) 80 MG tablet Take 1 tablet (80 mg total) by mouth daily.     ??? cholecalciferol, vitamin D3, (VITAMIN D3) 25 mcg (1,000 unit) capsule Take 2 capsules (2,000 Units total) by mouth daily.     ??? metoprolol tartrate (LOPRESSOR) 25 MG tablet Take 1 tablet (25 mg total) by mouth Two (2) times a day.     ??? nitroglycerin (NITROSTAT) 0.4 MG SL tablet Place 1 tablet (0.4 mg total) under the tongue every five (5) minutes as needed for chest pain. Maximum of 3 doses in 15 minutes.     ??? OMEGA-3/DHA/EPA/FISH OIL (FISH OIL-OMEGA-3 FATTY ACIDS) 300-1,000 mg capsule Take 2 capsules (2 g total) by mouth daily. 90 capsule 0   ??? predniSONE (DELTASONE) 5 MG tablet Take 1 tablet (5 mg total) by mouth daily. 30 tablet 11   ??? sertraline (ZOLOFT) 100 MG tablet TAKE 1 TABLET BY MOUTH EVERY DAY 30 tablet 6   ??? tacrolimus (ENVARSUS XR) 1 mg Tb24 extended release tablet Take 5 tablets (5 mg total) by mouth daily. 150 tablet 11   ??? ticagrelor (BRILINTA) 90 mg Tab Take 1 tablet (90 mg total) by mouth Two (2) times a day.       No current facility-administered medications for this visit.        Changes to medications: Johnny Evans reports no changes at this time.    Allergies   Allergen Reactions   ??? Ace Inhibitors      GI upset       Changes to allergies: No    SPECIALTY MEDICATION ADHERENCE     Prednisone 5mg  : 10 days of medicine on hand     Medication Adherence    Patient reported X missed doses in the last month: 0  Specialty Medication: prednisone 5 mg  Patient is on additional specialty medications: No      Adherence tools used: patient uses a pill box to manage medications                  Specialty medication(s) dose(s) confirmed: Regimen is correct and unchanged.     Are there any concerns with adherence? No    Adherence counseling provided? Not needed    CLINICAL MANAGEMENT AND INTERVENTION      Clinical Benefit Assessment:    Do you feel the medicine is effective or helping your condition? Yes    Clinical Benefit counseling provided? Not needed    Adverse Effects Assessment:    Are you experiencing any side effects? No    Are you experiencing difficulty administering your medicine? No    Quality of Life Assessment:    How many days over the past month did your transplant  keep you from your normal activities? For example, brushing your teeth or getting up in the morning. 0    Have you discussed  this with your provider? Not needed    Acute Infection Status:    Acute infections noted within Epic:  No active infections  Patient reported infection: None    Therapy Appropriateness:    Is therapy appropriate and patient progressing towards therapeutic goals? Yes, therapy is appropriate and should be continued    DISEASE/MEDICATION-SPECIFIC INFORMATION      N/A    PATIENT SPECIFIC NEEDS     - Does the patient have any physical, cognitive, or cultural barriers? No    - Is the patient high risk? No    - Does the patient require a Care Management Plan? No     SOCIAL DETERMINANTS OF HEALTH     At the [facility], we have learned that life circumstances - like trouble affording food, housing, utilities, or transportation can affect the health of many of our patients.   That is why we wanted to ask: are you currently experiencing any life circumstances that are negatively impacting your health and/or quality of life? No    Social Determinants of Health     Financial Resource Strain: Not on file   Internet Connectivity: Not on file   Food Insecurity: Not on file   Tobacco Use: Medium Risk (01/28/2021)    Patient History    ??? Smoking Tobacco Use: Former    ??? Smokeless Tobacco Use: Never    ??? Passive Exposure: Not on file   Housing/Utilities: Not on file   Alcohol Use: Not on file   Transportation Needs: Not on file   Substance Use: Not on file   Health Literacy: Not on file   Physical Activity: Not on file   Interpersonal Safety: Not on file   Stress: Not on file   Intimate Partner Violence: Not on file   Depression: Not on file   Social Connections: Not on file       Would you be willing to receive help with any of the needs that you have identified today? Not applicable       SHIPPING     Specialty Medication(s) to be Shipped:   Transplant: Prednisone 5mg     Other medication(s) to be shipped: No additional medications requested for fill at this time     Changes to insurance: No    Delivery Scheduled: Yes, Expected medication delivery date: 06/01/2022.     Medication will be delivered via UPS to the confirmed prescription address in [facility].    The patient will receive a drug information handout for each medication shipped and additional FDA Medication Guides as required.  Verified that patient has previously received a Conservation officer, historic buildings and a Surveyor, mining.    The patient or caregiver noted above participated in the development of this care plan and knows that they can request review of or adjustments to the care plan at any time.      All of the patient's questions and concerns have been addressed.    Thad Ranger   [facility] Specialty Pharmacist

## 2022-06-01 MED FILL — PREDNISONE 5 MG TABLET: ORAL | 30 days supply | Qty: 30 | Fill #5

## 2022-06-01 NOTE — Addendum Note (Signed)
Encounter addended by: Andee Lineman A on: 06/01/2022 12:01 PM  Actions taken: Imaging Exam ended

## 2022-06-12 ENCOUNTER — Emergency Department (HOSPITAL_COMMUNITY): Payer: PRIVATE HEALTH INSURANCE

## 2022-06-12 ENCOUNTER — Other Ambulatory Visit: Payer: Self-pay

## 2022-06-12 ENCOUNTER — Observation Stay (HOSPITAL_COMMUNITY)
Admission: EM | Admit: 2022-06-12 | Discharge: 2022-06-13 | Disposition: A | Discharge status: Home / Self Care | Payer: PRIVATE HEALTH INSURANCE | Attending: Student in an Organized Health Care Education/Training Program | Admitting: Student in an Organized Health Care Education/Training Program

## 2022-06-12 ENCOUNTER — Encounter (HOSPITAL_COMMUNITY): Payer: Self-pay

## 2022-06-12 DIAGNOSIS — J45909 Unspecified asthma, uncomplicated: Secondary | ICD-10-CM | POA: Insufficient documentation

## 2022-06-12 DIAGNOSIS — I4891 Unspecified atrial fibrillation: Secondary | ICD-10-CM | POA: Insufficient documentation

## 2022-06-12 DIAGNOSIS — I2510 Atherosclerotic heart disease of native coronary artery without angina pectoris: Secondary | ICD-10-CM | POA: Insufficient documentation

## 2022-06-12 DIAGNOSIS — Z79899 Other long term (current) drug therapy: Secondary | ICD-10-CM | POA: Diagnosis not present

## 2022-06-12 DIAGNOSIS — Z87891 Personal history of nicotine dependence: Secondary | ICD-10-CM | POA: Insufficient documentation

## 2022-06-12 DIAGNOSIS — Z7982 Long term (current) use of aspirin: Secondary | ICD-10-CM | POA: Insufficient documentation

## 2022-06-12 DIAGNOSIS — I10 Essential (primary) hypertension: Secondary | ICD-10-CM | POA: Diagnosis not present

## 2022-06-12 DIAGNOSIS — R079 Chest pain, unspecified: Secondary | ICD-10-CM | POA: Diagnosis present

## 2022-06-12 NOTE — ED Triage Notes (Signed)
Patient arrives with [facility] when patient was on the city bus on the way home from work. Pt reports chest pain and recently had stent placed at [facility] approx 2 months ago after an NSTEMI. Pt's initial BP with EMS was 130/64, was given 1 nitro SL tab; BP dropped to 98/60; pt given 150 mL NS bolus. Pt reports CP is now 1/10, on left side of chest and feels "sharp"; pt states cp "has eased up a little bit."

## 2022-06-13 DIAGNOSIS — I214 Non-ST elevation (NSTEMI) myocardial infarction: Secondary | ICD-10-CM

## 2022-06-13 DIAGNOSIS — R079 Chest pain, unspecified: Principal | ICD-10-CM

## 2022-06-13 LAB — CBC
HCT: 39.1 % (ref 39.0–52.0)
Hemoglobin: 13.2 g/dL (ref 13.0–17.0)
MCH: 30.5 pg (ref 26.0–34.0)
MCHC: 33.8 g/dL (ref 30.0–36.0)
MCV: 90.3 fL (ref 80.0–100.0)
Platelets: 247 10*3/uL (ref 150–400)
RBC: 4.33 MIL/uL (ref 4.22–5.81)
RDW: 14 % (ref 11.5–15.5)
WBC: 11.5 10*3/uL — ABNORMAL HIGH (ref 4.0–10.5)
nRBC: 0 % (ref 0.0–0.2)

## 2022-06-13 LAB — BASIC METABOLIC PANEL
Anion gap: 8 (ref 5–15)
BUN: 11 mg/dL (ref 6–20)
CO2: 22 mmol/L (ref 22–32)
Calcium: 9.2 mg/dL (ref 8.9–10.3)
Chloride: 109 mmol/L (ref 98–111)
Creatinine, Ser: 0.85 mg/dL (ref 0.61–1.24)
GFR, Estimated: >60 mL/min (ref >60)
Glucose, Bld: 105 mg/dL — ABNORMAL HIGH (ref 70–99)
Potassium: 3.7 mmol/L (ref 3.5–5.1)
Sodium: 139 mmol/L (ref 135–145)

## 2022-06-13 LAB — TROPONIN I (HIGH SENSITIVITY)
Troponin I (High Sensitivity): 7 ng/L (ref <18)
Troponin I (High Sensitivity): 7 ng/L (ref <18)

## 2022-06-13 MED ORDER — TICAGRELOR 90 MG PO TABS: 90.0000 mg | ORAL_TABLET | Freq: Two times a day (BID) | ORAL | Status: DC

## 2022-06-13 MED ORDER — METOPROLOL SUCCINATE ER 25 MG PO TB24: 12.5000 mg | ORAL_TABLET | Freq: Every day | ORAL | Status: DC

## 2022-06-13 MED ORDER — VITAMIN D 25 MCG (1000 UNIT) PO TABS: 1000.0000 [IU] | ORAL_TABLET | Freq: Every day | ORAL | Status: DC

## 2022-06-13 MED ORDER — ASPIRIN 81 MG PO TBEC: 81.0000 mg | DELAYED_RELEASE_TABLET | Freq: Every day | ORAL | Status: DC

## 2022-06-13 MED ORDER — AMLODIPINE BESYLATE 5 MG PO TABS: 5.0000 mg | ORAL_TABLET | Freq: Every day | ORAL | Status: DC

## 2022-06-13 MED ORDER — PREDNISONE 5 MG PO TABS
5.0000 mg | ORAL_TABLET | Freq: Every day | ORAL | Status: DC
  Filled 2022-06-13: qty 1

## 2022-06-13 MED ORDER — ATORVASTATIN CALCIUM 40 MG PO TABS: 80.0000 mg | ORAL_TABLET | Freq: Every day | ORAL | Status: DC

## 2022-06-13 MED ORDER — TACROLIMUS ER 4 MG PO TB24
5.0000 mg | ORAL_TABLET | Freq: Every day | ORAL | Status: DC
  Filled 2022-06-13 (×2): qty 1

## 2022-06-13 NOTE — ED Provider Notes (Signed)
[facility] Emergency Department Provider Note MRN:  [id number]  Arrival date & time: 06/13/22     Chief Complaint   Chest Pain   History of Present Illness   Dave [name] is a 46 y.o. year-old male with a history of CAD presenting to the ED with chief complaint of chest pain.  Patient having left-sided sharp chest pain on and off for the past few days, worse this evening.  No dizziness or diaphoresis, no nausea vomiting, no shortness of breath.  No leg pain or swelling.  No fever or cough.  Had a heart attack 2 months ago and a stent was placed.  This pain feels different.  Review of Systems  A thorough review of systems was obtained and all systems are negative except as noted in the HPI and PMH.   Patient's Health History    Past Medical History:  Diagnosis Date   Asthma    Atrial fibrillation (HCC)    uses ASA and fish oil    Depression    GERD (gastroesophageal reflux disease)    Hx of gout    Hyperlipidemia    Hypertension    Kidney failure    NSTEMI (non-ST elevated myocardial infarction) ([place]) 04/14/2022   Renal insufficiency     Past Surgical History:  Procedure Laterality Date   CORONARY STENT INTERVENTION N/A 04/14/2022   Procedure: CORONARY STENT INTERVENTION;  Surgeon: Wellington Hampshire, MD;  Location: [place] [facility];  Service: Cardiovascular;  Laterality: N/A;   CYST EXCISION Left 06/11/2016   Procedure: CYST REMOVAL;  Surgeon: Beverly Gust, MD;  Location: [place];  Service: ENT;  Laterality: Left;  Left upper forehead   DIALYSIS FISTULA CREATION  2010   INTRAVASCULAR PRESSURE WIRE/FFR STUDY N/A 04/14/2022   Procedure: INTRAVASCULAR PRESSURE WIRE/FFR STUDY;  Surgeon: Wellington Hampshire, MD;  Location: [place] [facility];  Service: Cardiovascular;  Laterality: N/A;   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   LEFT HEART CATH AND CORONARY ANGIOGRAPHY N/A 04/14/2022   Procedure: LEFT HEART CATH AND CORONARY ANGIOGRAPHY;   Surgeon: Wellington Hampshire, MD;  Location: [place] [facility];  Service: Cardiovascular;  Laterality: N/A;    Family History  Problem Relation Age of Onset   Hypertension Father    Hyperlipidemia Father    Heart disease Father        CABG    Social History   Socioeconomic History   Marital status: Married    Spouse name: Not on file   Number of children: Not on file   Years of education: Not on file   Highest education level: Not on file  Occupational History   Not on file  Tobacco Use   Smoking status: Former    Packs/day: 0.50    Years: 7.00    Total pack years: 3.50    Types: Cigarettes    Quit date: 05/21/2009    Years since quitting: 13.0   Smokeless tobacco: Never  Substance and Sexual Activity   Alcohol use: No   Drug use: Not on file    Comment: Smoked marijuana for about 10-12 years; quit 2010.   Sexual activity: Not on file  Other Topics Concern   Not on file  Social History Narrative   Not on file   Social Determinants of Health   Financial Resource Strain: Not on file  Food Insecurity: Not on file  Transportation Needs: Not on file  Physical Activity: Not on file  Stress: Not  on file  Social Connections: Not on file  Intimate Partner Violence: Not on file     Physical Exam   Vitals:   06/13/22 0241 06/13/22 0417  BP: 125/81 122/63  Pulse: (!) 54 63  Resp: 16 16  Temp: 98 F (36.7 C)   SpO2: 99% 99%    CONSTITUTIONAL: Well-appearing, NAD NEURO/PSYCH:  Alert and oriented x 3, no focal deficits EYES:  eyes equal and reactive ENT/NECK:  no LAD, no JVD CARDIO: Regular rate, well-perfused, normal S1 and S2 PULM:  CTAB no wheezing or rhonchi GI/GU:  non-distended, non-tender MSK/SPINE:  No gross deformities, no edema SKIN:  no rash, atraumatic   *Additional and/or pertinent findings included in MDM below  Diagnostic and Interventional Summary    EKG Interpretation  Date/Time:  Saturday June 12 2022 23:30:44 EDT Ventricular Rate:   63 PR Interval:  142 QRS Duration: 90 QT Interval:  376 QTC Calculation: 384 R Axis:   35 Text Interpretation: Normal sinus rhythm T wave abnormality, consider lateral ischemia Abnormal ECG When compared with ECG of 16-Apr-2022 09:24, PREVIOUS ECG IS PRESENT similar TWI, improved, to july Confirmed by Merrily Pew [phone]) on 06/12/2022 11:33:49 PM       Labs Reviewed  BASIC METABOLIC PANEL - Abnormal; Notable for the following components:      Result Value   Glucose, Bld 105 (*)    All other components within normal limits  CBC - Abnormal; Notable for the following components:   WBC 11.5 (*)    All other components within normal limits  TROPONIN I (HIGH SENSITIVITY)  TROPONIN I (HIGH SENSITIVITY)    DG Chest 2 View  Final Result      Medications  aspirin EC tablet 81 mg (has no administration in time range)  amLODipine (NORVASC) tablet 5 mg (has no administration in time range)  atorvastatin (LIPITOR) tablet 80 mg (has no administration in time range)  metoprolol succinate (TOPROL-XL) 24 hr tablet 12.5 mg (has no administration in time range)  predniSONE (DELTASONE) tablet 5 mg (has no administration in time range)  ticagrelor (BRILINTA) tablet 90 mg (has no administration in time range)  tacrolimus ER (ENVARSUS XR) tablet 5 mg (has no administration in time range)  cholecalciferol (VITAMIN D3) 25 MCG (1000 UNIT) tablet 1,000 Units (has no administration in time range)     Procedures  /  Critical Care Procedures  ED Course and Medical Decision Making  Initial Impression and Ddx More of an atypical chest pain presentation, recent cath report demonstrated two-vessel disease, single stent was placed.  Reassuring vital signs, EKG, ACS is of course considered.  He is compliant with medications.  Anticipating need for cardiology consultation.  Doubt PE, doubt dissection.  [place] MSK.  Past medical/surgical history that increases complexity of ED encounter: CAD status post  stent placement  Interpretation of Diagnostics I personally reviewed the EKG and my interpretation is as follows: Sinus rhythm without significant changes from prior  Labs are reassuring with no significant blood count or electrolyte disturbance, troponin negative x2.  Patient Reassessment and Ultimate Disposition/Management     Case discussed with Dr. Conley Canal of cardiology, who will admit for observation.  Patient management required discussion with the following services or consulting groups:  Cardiology  Complexity of Problems Addressed Acute illness or injury that poses threat of life of bodily function  Additional Data Reviewed and Analyzed Further history obtained from: Recent Consult notes and Prior labs/imaging results  Additional Factors Impacting ED Encounter Risk  Consideration of hospitalization  Barth Kirks. [place] Small, [place] [email or web address]  Final Clinical Impressions(s) / ED Diagnoses  No diagnosis found.  ED Discharge Orders     None        Discharge Instructions Discussed with and Provided to Patient:   Discharge Instructions   None      Maudie Flakes, MD 06/13/22 [phone]

## 2022-06-13 NOTE — Discharge Summary (Signed)
Discharge Summary    Patient ID: Dave [name] MRN: [id number]; DOB: Jun 05, 1976  Admit date: 06/12/2022 Discharge date: 06/13/2022  PCP:  Lucinda Dell, MD   [place] Prov[facility] Cardiologist:  Peter Martinique, MD        Discharge Diagnoses    Principal Problem:   Chest pain    Diagnostic Studies/Procedures    2 view CXR, results below _____________   History of Present Illness     Dave [name] is a 46 y.o. male with PMH of multivessel CAD with NSTEMI s/p PCI to LAD 04/14/22, FSGS s/ DDKT (2015), HTN, HLD, who was admitted 06/13/2022 for chest pain.  Hospital Course     Consultants: None  The chest pain occurred after he had exerted himself unusually by running 4 thoughts.  The pain started several minutes later and was sharp.  It was unlike his MI pain.  He did take nitroglycerin and aspirin for the pain.  He had shortness of breath and palpitations with the pain.  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  In the ER, his troponins were negative and his ECG was unchanged.  His chest pain did not return.  He noted that he had not missed any doses of his DAPT so in-stent thrombosis was highly unlikely.  He was kept on telemetry overnight and there were no significant arrhythmias.  On 9/24, he was seen by Dr Johnsie Cancel and all data were reviewed.  Because his ECG was unchanged and he had remained pain-free with negative troponins, no further inpatient work-up was indicated and he is considered stable for discharge, to follow-up as scheduled.  We will leave to Dr. Martinique to consider addition of long-acting nitrates and stress testing as an outpatient.  Did the patient have an acute coronary syndrome (MI, NSTEMI, STEMI, etc) this admission?:  No                               Did the patient have a percutaneous coronary intervention (stent / angioplasty)?:  No.       ____________  Discharge Vitals Blood pressure 133/84, pulse (!) 57, temperature 97.6 F (36.4 C), temperature source Oral, resp. rate 16, height 6\' 1"  (1.854 m), weight 99.8 kg, SpO2 99 %.  Filed Weights   06/12/22 2333  Weight: 99.8 kg    Labs & Radiologic Studies    CBC Recent Labs    06/12/22 2343  WBC 11.5*  HGB 13.2  HCT 39.1  MCV 90.3  PLT [id number]   Basic Metabolic Panel Recent Labs    06/12/22 2343  NA 139  K 3.7  CL 109  CO2 22  GLUCOSE 105*  BUN 11  CREATININE 0.85  CALCIUM 9.2   Liver Function Tests No results for input(s): "AST", "ALT", "ALKPHOS", "BILITOT", "PROT", "ALBUMIN" in the last 72 hours. No results for input(s): "LIPASE", "AMYLASE" in the last 72 hours. High Sensitivity Troponin:   Recent Labs  Lab 06/12/22 2343 06/13/22 0149  TROPONINIHS 7 7    BNP Invalid input(s): "POCBNP" D-Dimer No results for input(s): "DDIMER" in the last 72 hours. Hemoglobin A1C No results for input(s): "HGBA1C" in the last 72 hours. Fasting Lipid Panel No results for input(s): "CHOL", "HDL", "LDLCALC", "TRIG", "CHOLHDL", "LDLDIRECT" in the last 72 hours. Thyroid Function Tests No results for  input(s): "TSH", "T4TOTAL", "T3FREE", "THYROIDAB" in the last 72 hours.  Invalid input(s): "FREET3" _____________  DG Chest 2 View  Result Date: 06/13/2022 CLINICAL DATA:  Chest pain EXAM: CHEST - 2 VIEW COMPARISON:  Radiographs 04/16/2022 FINDINGS: No focal consolidation, pleural effusion, or pneumothorax. Normal cardiomediastinal silhouette. No acute osseous abnormality. IMPRESSION: No active cardiopulmonary disease. Electronically Signed   By: Placido Sou M.D.   On: 06/13/2022 00:05   LONG TERM MONITOR-LIVE TELEMETRY (3-14 DAYS)  Result Date: 06/02/2022   Normal sinus rhythm   17 brief runs of SVT - longest 13 beats   Otherwise rare ectopy Patch Wear Time:  7 days and 22 hours (2023-07-28T12:08:47-0400 to 2023-08-05T11:06:42-0400) Patient had a min HR of 47 bpm, max  HR of 140 bpm, and avg HR of 68 bpm. Predominant underlying rhythm was Sinus Rhythm. 17 Supraventricular Tachycardia runs occurred, the run with the fastest interval lasting 7 beats with a max rate of 140 bpm, the longest lasting 13 beats with an avg rate of 102 bpm. Isolated SVEs were rare (<1.0%), SVE Couplets were rare (<1.0%), and SVE Triplets were rare (<1.0%). Isolated VEs were rare (<1.0%), and no VE Couplets or VE Triplets were present.    Disposition   Pt is being discharged home today in good condition.  Follow-up Plans & Appointments     Follow-up Information     Martinique, Peter M, MD Follow up.   Specialty: Cardiology Why: Keep November appointment. Report additional symptoms. Contact information: [address] [place] [address] [phone]                Discharge Instructions     Diet - low sodium heart healthy   Complete by: As directed    Increase activity slowly   Complete by: As directed        Discharge Medications   Allergies as of 06/13/2022       Reactions   Ace Inhibitors Nausea Only        Medication List     TAKE these medications    amLODipine 5 MG tablet Commonly known as: NORVASC Take 1 tablet (5 mg total) by mouth daily.   aspirin 81 MG tablet Take 81 mg by mouth daily.   atorvastatin 80 MG tablet Commonly known as: LIPITOR Take 1 tablet (80 mg total) by mouth daily.   ENVARSUS XR PO Take 5 tablets by mouth daily before breakfast.   metoprolol succinate 25 MG 24 hr tablet Commonly known as: TOPROL-XL Take 0.5 tablets (12.5 mg total) by mouth daily. Take with or immediately following a meal.   nitroGLYCERIN 0.4 MG SL tablet Commonly known as: NITROSTAT Place 1 tablet (0.4 mg total) under the tongue every 5 (five) minutes x 3 doses as needed for chest pain.   predniSONE 5 MG tablet Commonly known as: DELTASONE Take 5 mg by mouth daily with breakfast.   ticagrelor 90 MG Tabs tablet Commonly known  as: BRILINTA Take 1 tablet (90 mg total) by mouth 2 (two) times daily.   Vitamin D 1000 units capsule Take 1,000 Units by mouth daily.           Outstanding Labs/Studies   None  Duration of Discharge Encounter   Greater than 30 minutes including physician time.  Signed, Rosaria Ferries, PA-C 06/13/2022, 8:13 AM

## 2022-06-13 NOTE — Progress Notes (Signed)
   Cardiologist :  Fletcher Anon  Subjective:  Denies SSCP, palpitations or Dyspnea   Objective:  Vitals:   06/13/22 0159 06/13/22 0241 06/13/22 0417 06/13/22 0738  BP: (!) 129/51 125/81 122/63 133/84  Pulse: (!) 56 (!) 54 63 (!) 57  Resp: 16 16 16 16   Temp:  98 F (36.7 C)  97.6 F (36.4 C)  TempSrc:  Oral  Oral  SpO2:  99% 99% 99%  Weight:      Height:        Intake/Output from previous day: No intake or output data in the 24 hours ending 06/13/22 0742  Physical Exam: Affect appropriate Healthy:  appears stated age 46: normal Neck supple with no adenopathy JVP normal no bruits no thyromegaly Lungs clear with no wheezing and good diaphragmatic motion Heart:  S1/S2 no murmur, no rub, gallop or click PMI normal Abdomen: benighn, BS positve, no tenderness, no AAA no bruit.  No HSM or HJR Distal pulses intact with no bruits No edema Neuro non-focal Skin warm and dry No muscular weakness   Lab Results: Basic Metabolic Panel: Recent Labs    06/12/22 2343  NA 139  K 3.7  CL 109  CO2 22  GLUCOSE 105*  BUN 11  CREATININE 0.85  CALCIUM 9.2   Liver Function Tests: No results for input(s): "AST", "ALT", "ALKPHOS", "BILITOT", "PROT", "ALBUMIN" in the last 72 hours. No results for input(s): "LIPASE", "AMYLASE" in the last 72 hours. CBC: Recent Labs    06/12/22 2343  WBC 11.5*  HGB 13.2  HCT 39.1  MCV 90.3  PLT 247     Imaging: DG Chest 2 View  Result Date: 06/13/2022 CLINICAL DATA:  Chest pain EXAM: CHEST - 2 VIEW COMPARISON:  Radiographs 04/16/2022 FINDINGS: No focal consolidation, pleural effusion, or pneumothorax. Normal cardiomediastinal silhouette. No acute osseous abnormality. IMPRESSION: No active cardiopulmonary disease. Electronically Signed   By: Placido Sou M.D.   On: 06/13/2022 00:05    Cardiac Studies:  ECG: NSR no acute changes    Telemetry:  NSR  Echo:   Medications:    amLODipine  5 mg Oral Daily   aspirin EC  81 mg Oral Daily    atorvastatin  80 mg Oral Daily   cholecalciferol  1,000 Units Oral Daily   metoprolol succinate  12.5 mg Oral Daily   predniSONE  5 mg Oral Q breakfast   tacrolimus ER  5 mg Oral QAC breakfast   ticagrelor  90 mg Oral BID      Assessment/Plan:   Chest Pain :  04/14/22 had DES to mid LAD residual small vessel dx in ramus and 60% distal RCA compliant with DAT R/O no ECG changes chest pain free continue medical Rx can consider outpatient addition of LA nitrates and stress testing Outpatient f/u with [facility] HLD:  continue statin  Renal transplant f/u [facility] continue prednisone and tacrolimus   Jenkins Rouge 06/13/2022, 7:42 AM

## 2022-06-13 NOTE — H&P (Signed)
Cardiology Admission History and Physical   Patient ID: Dave [name] MRN: [id number]; DOB: 1975-10-02   Admission date: 06/12/2022  PCP:  Lucinda Dell, MD   [place] Prov[facility] Cardiologist:  Peter Martinique, MD        Chief Complaint:  chest pain  Patient Profile:   Dave [name] is a 46 y.o. male with a PMH of multivessel CAD with NSTEMI s/p PCI to LAD 04/14/22, FSGS s/ DDKT (2015), HTN, HLD, and atrial fibrillation? who is being seen 06/13/2022 for the evaluation of chest pain.  History of Present Illness:   Dave [name] was recently hospitalized at [facility] from 7/25 -7/27 for an NSTEMI.  For that admission he presented with severe chest pain that per chart review " felt like somebody was punching him in the chest." During that hospitalization he underwent PCI to his LAD and was found to have multivessel CAD that is currently being medically managed.  Yesterday the patient was trying to catch the bus by running it down for approximately 4 minutes, but unfortunately was unable to catch the bus.  He waited for about an hour before another bus came to pick him up.  During the bus ride he developed sudden onset, sharp, severe, chest pains underneath the left pectoral muscle.  The chest pain lasted for 4 to 5 minutes before he took 4 sublingual nitroglycerin tablets at the same time and 4 baby aspirin which relieved his pain.  This episode of chest pain was associated with SOB and palpitations, but he denies syncope, presyncope, fevers, chills, GI symptoms, weakness, numbness.  He says that the chest pain he experienced feels different than his recent MI chest pain.  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  He estimates that he walks at least 20,000 steps daily and does not get chest pain.  He has had to use sublingual nitro tablets twice (including yesterday) since his NSTEMI.  In the ED his VS were afebrile, BP 117/73, HR  63, RR 16, satting 95% on RA.  Labs notable for WBC 11.5 and troponins 7 x2.  CXR was completely negative.  ECG was unchanged from prior.  He is currently chest pain-free.   Past Medical History:  Diagnosis Date   Asthma    Atrial fibrillation (HCC)    uses ASA and fish oil    Depression    GERD (gastroesophageal reflux disease)    Hx of gout    Hyperlipidemia    Hypertension    Kidney failure    NSTEMI (non-ST elevated myocardial infarction) ([place]) 04/14/2022   Renal insufficiency     Past Surgical History:  Procedure Laterality Date   CORONARY STENT INTERVENTION N/A 04/14/2022   Procedure: CORONARY STENT INTERVENTION;  Surgeon: Wellington Hampshire, MD;  Location: [place] [facility];  Service: Cardiovascular;  Laterality: N/A;   CYST EXCISION Left 06/11/2016   Procedure: CYST REMOVAL;  Surgeon: Beverly Gust, MD;  Location: [facility];  Service: ENT;  Laterality: Left;  Left upper forehead   DIALYSIS FISTULA CREATION  2010   INTRAVASCULAR PRESSURE WIRE/FFR STUDY N/A 04/14/2022   Procedure: INTRAVASCULAR PRESSURE WIRE/FFR STUDY;  Surgeon: Wellington Hampshire, MD;  Location: [facility];  Service: Cardiovascular;  Laterality: N/A;   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 Authorizing Provider  amLODipine (NORVASC) 5 MG tablet Take 1 tablet (5 mg total) by mouth daily. 04/16/22   Cheryln Manly, NP  aspirin 81 MG tablet Take 81 mg by mouth daily.    [provider]  atorvastatin (LIPITOR) 80 MG tablet Take 1 tablet (80 mg total) by mouth daily. 04/16/22   Cheryln Manly, NP  Cholecalciferol (VITAMIN D) 1000 UNITS capsule Take 1,000  Units by mouth daily.    [provider]  metoprolol succinate (TOPROL-XL) 25 MG 24 hr tablet Take 0.5 tablets (12.5 mg total) by mouth daily. Take with or immediately following a meal. 04/28/22 04/23/23  Finis Bud, NP  nitroGLYCERIN (NITROSTAT) 0.4 MG SL tablet Place 1 tablet (0.4 mg total) under the tongue every 5 (five) minutes x 3 doses as needed for chest pain. 04/15/22   Cheryln Manly, NP  predniSONE (DELTASONE) 5 MG tablet Take 5 mg by mouth daily with breakfast.    [provider]  Tacrolimus (ENVARSUS XR PO) Take 5 tablets by mouth daily before breakfast.    [provider]  ticagrelor (BRILINTA) 90 MG TABS tablet Take 1 tablet (90 mg total) by mouth 2 (two) times daily. 04/15/22   Cheryln Manly, NP     Allergies:    Allergies  Allergen Reactions   Ace Inhibitors Nausea Only    Social History:   Social History   Socioeconomic History   Marital status: Married    Spouse name: Not on file   Number of children: Not on file   Years of education: Not on file   Highest education level: Not on file  Occupational History   Not on file  Tobacco Use   Smoking status: Former    Packs/day: 0.50    Years: 7.00    Total pack years: 3.50    Types: Cigarettes    Quit date: 05/21/2009    Years since quitting: 13.0   Smokeless tobacco: Never  Substance and Sexual Activity   Alcohol use: No   Drug use: Not on file    Comment: Smoked marijuana for about 10-12 years; quit 2010.   Sexual activity: Not on file  Other Topics Concern   Not on file  Social History Narrative   Not on file   Social Determinants of Health   Financial Resource Strain: Not on file  Food Insecurity: Not on file  Transportation Needs: Not on file  Physical Activity: Not on file  Stress: Not on file  Social Connections: Not on file  Intimate Partner Violence: Not on file    Family History:   The patient's family history includes Heart disease in his father;  Hyperlipidemia in his father; Hypertension in his father.    ROS:  Please see the history of present illness.  All other ROS reviewed and negative.     Physical Exam/Data:   Vitals:   06/12/22 2333 06/13/22 0159 06/13/22 0241 06/13/22 0417  BP:  (!) 129/51 125/81 122/63  Pulse:  (!) 56 (!) 54 63  Resp:  16 16 16   Temp:   98 F (36.7 C)   TempSrc:   Oral   SpO2:   99% 99%  Weight: 99.8 kg     Height: 6\' 1"  (1.854 m)  No intake or output data in the 24 hours ending 06/13/22 0607    06/12/2022   11:33 PM 04/28/2022    9:41 AM 04/16/2022    9:41 AM  Last 3 Weights  Weight (lbs) 220 lb 217 lb 3.2 oz 220 lb  Weight (kg) 99.791 kg 98.521 kg 99.791 kg     Body mass index is 29.03 kg/m.  General:  Well nourished, well developed, in no acute distress, very pleasant HEENT: normal Neck: no JVD Vascular: No carotid bruits; Distal pulses 2+ bilaterally   Cardiac:  normal S1, S2; RRR; no murmur  Lungs:  clear to auscultation bilaterally, no wheezing, rhonchi or rales  Abd: soft, nontender, no hepatomegaly  Ext: no edema Musculoskeletal:  No deformities, BUE and BLE strength normal and equal Skin: warm and dry  Neuro:  CNs 2-12 intact, no focal abnormalities noted Psych:  Normal affect    EKG:      Relevant CV Studies:  TTE 04/15/22:  IMPRESSIONS     1. Left ventricular ejection fraction, by estimation, is 55%. The left  ventricle has normal function. The left ventricle has no regional wall  motion abnormalities. Left ventricular diastolic parameters were normal.   2. Right ventricular systolic function is normal. The right ventricular  size is normal. Tricuspid regurgitation signal is inadequate for assessing  PA pressure.   3. The mitral valve is normal in structure. Trivial mitral valve  regurgitation. No evidence of mitral stenosis.   4. The aortic valve is tricuspid. There is mild calcification of the  aortic valve. Aortic valve regurgitation is not visualized.  No aortic  stenosis is present.   5. The inferior vena cava is normal in size with <50% respiratory  variability, suggesting right atrial pressure of 8 mmHg.   LHC 04/14/22:   Prox RCA lesion is 30% stenosed.   Dist RCA lesion is 60% stenosed.   Ramus lesion is 95% stenosed.   Mid LAD-1 lesion is 75% stenosed.   Mid LAD-2 lesion is 30% stenosed.   Dist LAD lesion is 20% stenosed.   Prox LAD lesion is 30% stenosed.   A drug-eluting stent was successfully placed using a SYNERGY XD [id number].   Post intervention, there is a 0% residual stenosis.   1.  Significant underlying two-vessel coronary artery disease.  The culprit for non-ST elevation myocardial infarction seems to be severe stenosis of the distal ramus.  The vessel is small in diameter in that area and tortuous distally.  Borderline mid LAD stenosis was significant by flow reserve evaluation with an RFR ratio of 0.88.  Moderate distal RCA stenosis. 2.  Left ventricular angiography was not performed to minimize contrast use.  We will obtain an echocardiogram.  Normal left ventricular end-diastolic pressure. 3.  Successful drug-eluting stent placement to the mid LAD.   Recommendations: Dual antiplatelet therapy for at least 1 year. Aggressive treatment of risk factors. The distal ramus is too small to stent.  Laboratory Data:  High Sensitivity Troponin:   Recent Labs  Lab 06/12/22 2343 06/13/22 0149  TROPONINIHS 7 7      Chemistry Recent Labs  Lab 06/12/22 2343  NA 139  K 3.7  CL 109  CO2 22  GLUCOSE 105*  BUN 11  CREATININE 0.85  CALCIUM 9.2  GFRNONAA >60  ANIONGAP 8    No results for input(s): "PROT", "ALBUMIN", "AST", "ALT", "ALKPHOS", "BILITOT" in the last 168 hours. Lipids No results for input(s): "CHOL", "TRIG", "HDL", "LABVLDL", "LDLCALC", "CHOLHDL" in  the last 168 hours. Hematology Recent Labs  Lab 06/12/22 2343  WBC 11.5*  RBC 4.33  HGB 13.2  HCT 39.1  MCV 90.3  MCH 30.5  MCHC 33.8  RDW 14.0   PLT 247   Thyroid No results for input(s): "TSH", "FREET4" in the last 168 hours. BNPNo results for input(s): "BNP", "PROBNP" in the last 168 hours.  DDimer No results for input(s): "DDIMER" in the last 168 hours.   Radiology/Studies:  DG Chest 2 View  Result Date: 06/13/2022 CLINICAL DATA:  Chest pain EXAM: CHEST - 2 VIEW COMPARISON:  Radiographs 04/16/2022 FINDINGS: No focal consolidation, pleural effusion, or pneumothorax. Normal cardiomediastinal silhouette. No acute osseous abnormality. IMPRESSION: No active cardiopulmonary disease. Electronically Signed   By: Placido Sou M.D.   On: 06/13/2022 00:05     Assessment and Plan:   Dave [name] is a 46 y.o. male with a PMH of multivessel CAD with NSTEMI s/p PCI to LAD 04/14/22, FSGS s/ DDKT (2015), HTN, HLD, and atrial fibrillation? who is being seen 06/13/2022 for the evaluation of chest pain.  #Possible Cardiac Chest Pain :: Patient presented with an acute episode of chest pain with shortness of breath while at rest.  He was recently admitted for an NSTEMI and was found to have multivessel CAD 1 of which was amenable to PCI and the other vessel was too small for intervention.  It is possible that this episode of chest pain was cardiac in etiology because he does not have a completely normal coronaries and may get intermittent angina as a result.  Another possibility is that he had an arrhythmia that resulted in chest discomfort, SOB, and palpitations that resolved by the time he arrived to the ED.  Thankfully, his EKG is unchanged and his troponins have ruled out.  He further endorses great adherence to his DAPT so in-stent thrombosis is highly unlikely.  I will hobs admit him for now on telemetry to monitor for any arrhythmias or recurrence of chest pain.  If he remains stable then can consider discharge with ambulatory cardiac monitoring with close cardiology follow-up. -Admit to obs for telemetry -Consider Holter monitor at  discharge -Consider adding long-acting nitrate versus continued as needed SL NGT  #Multivessel CAD #NSTEMI s/p Recent PCI -continue asa and ticagrelor -continue statin -continue metoprolol  #FSGS s/p DDKT -continue prednisone 5 mg daily -continue FK 5 mg daily    Risk Assessment/Risk Scores:         CHA2DS2-VASc Score = 2   This indicates a 2.2% annual risk of stroke. The patient's score is based upon: CHF History: 0 HTN History: 1 Diabetes History: 0 Stroke History: 0 Vascular Disease History: 1 Age Score: 0 Gender Score: 0      Severity of Illness: The appropriate patient status for this patient is OBSERVATION. Observation status is judged to be reasonable and necessary in order to provide the required intensity of service to ensure the patient's safety. The patient's presenting symptoms, physical exam findings, and initial radiographic and laboratory data in the context of their medical condition is felt to place them at decreased risk for further clinical deterioration. Furthermore, it is anticipated that the patient will be medically stable for discharge from the hospital within 2 midnights of admission.    For questions or updates, please contact [place] Please consult [email or web address] for contact info under     Signed, Hershal Coria, MD  06/13/2022 6:07 AM

## 2022-06-15 NOTE — Unmapped (Signed)
[facility] ED discharge and H&P notes.

## 2022-06-28 NOTE — Research (Signed)
Spoke to patient about Ocean(a) trial and stated not interested in participating at this time.

## 2022-06-29 ENCOUNTER — Telehealth (HOSPITAL_COMMUNITY): Payer: Self-pay

## 2022-06-29 NOTE — Telephone Encounter (Signed)
Called and spoke with pt in regards to CR, pt stated he is not interested at this time due to his work schedule.  Closed referral 

## 2022-06-30 NOTE — Unmapped (Signed)
[facility] Refill Coordination Note    Specialty Medication(s) to be Shipped:   Transplant: Prednisone 5mg     Other medication(s) to be shipped: No additional medications requested for fill at this time     Johnny Evans, DOB: 1976-02-20  Phone: [phone] (home)       All above HIPAA information was verified with patient.     Was a Nurse, learning disability used for this call? No    Completed refill call assessment today to schedule patient's medication shipment from the [facility] [phone]).  All relevant notes have been reviewed.     Specialty medication(s) and dose(s) confirmed: Regimen is correct and unchanged.   Changes to medications: Mitchell reports no changes at this time.  Changes to insurance: Yes: entered into wam  New side effects reported not previously addressed with a pharmacist or physician: None reported  Questions for the pharmacist: No    Confirmed patient received a Conservation officer, historic buildings and a Surveyor, mining with first shipment. The patient will receive a drug information handout for each medication shipped and additional FDA Medication Guides as required.       DISEASE/MEDICATION-SPECIFIC INFORMATION        N/A    SPECIALTY MEDICATION ADHERENCE     Medication Adherence    Patient reported X missed doses in the last month: 0  Specialty Medication: prednisone 5 mg  Patient is on additional specialty medications: No      Adherence tools used: patient uses a pill box to manage medications                      Were doses missed due to medication being on hold? No    prednisone 5 mg  : 10 days of medicine on hand       REFERRAL TO PHARMACIST     Referral to the pharmacist: Not needed      [facility]     Shipping address confirmed in Epic.     Delivery Scheduled: Yes, Expected medication delivery date: 10/13.     Medication will be delivered via UPS to the prescription address in Epic WAM.    Westley Gambles   [facility] Specialty Technician

## 2022-07-02 MED FILL — PREDNISONE 5 MG TABLET: ORAL | 30 days supply | Qty: 30 | Fill #6

## 2022-07-09 ENCOUNTER — Encounter (HOSPITAL_COMMUNITY): Payer: Self-pay | Admitting: Emergency Medicine

## 2022-07-09 ENCOUNTER — Emergency Department (HOSPITAL_COMMUNITY): Payer: PRIVATE HEALTH INSURANCE

## 2022-07-09 ENCOUNTER — Other Ambulatory Visit: Payer: Self-pay

## 2022-07-09 ENCOUNTER — Emergency Department (HOSPITAL_COMMUNITY)
Admission: EM | Admit: 2022-07-09 | Discharge: 2022-07-10 | Disposition: A | Discharge status: Home / Self Care | Payer: PRIVATE HEALTH INSURANCE | Attending: Emergency Medicine | Admitting: Emergency Medicine

## 2022-07-09 DIAGNOSIS — Z79899 Other long term (current) drug therapy: Secondary | ICD-10-CM | POA: Diagnosis not present

## 2022-07-09 DIAGNOSIS — R0789 Other chest pain: Secondary | ICD-10-CM | POA: Diagnosis present

## 2022-07-09 DIAGNOSIS — E559 Vitamin D deficiency, unspecified: Principal | ICD-10-CM

## 2022-07-09 DIAGNOSIS — F32A Depression, unspecified depression type: Principal | ICD-10-CM

## 2022-07-09 DIAGNOSIS — I10 Essential (primary) hypertension: Principal | ICD-10-CM

## 2022-07-09 DIAGNOSIS — Z940 Kidney transplant status: Principal | ICD-10-CM

## 2022-07-09 DIAGNOSIS — R072 Precordial pain: Secondary | ICD-10-CM

## 2022-07-09 DIAGNOSIS — Z7982 Long term (current) use of aspirin: Secondary | ICD-10-CM | POA: Insufficient documentation

## 2022-07-09 DIAGNOSIS — E7849 Other hyperlipidemia: Secondary | ICD-10-CM | POA: Insufficient documentation

## 2022-07-09 LAB — CBC
HCT: 44.6 % (ref 39.0–52.0)
Hemoglobin: 14.5 g/dL (ref 13.0–17.0)
MCH: 30.1 pg (ref 26.0–34.0)
MCHC: 32.5 g/dL (ref 30.0–36.0)
MCV: 92.7 fL (ref 80.0–100.0)
Platelets: 270 10*3/uL (ref 150–400)
RBC: 4.81 MIL/uL (ref 4.22–5.81)
RDW: 14.5 % (ref 11.5–15.5)
WBC: 10.4 10*3/uL (ref 4.0–10.5)
nRBC: 0 % (ref 0.0–0.2)

## 2022-07-09 LAB — BASIC METABOLIC PANEL
Anion gap: 9 (ref 5–15)
BUN: 17 mg/dL (ref 6–20)
CO2: 24 mmol/L (ref 22–32)
Calcium: 9.7 mg/dL (ref 8.9–10.3)
Chloride: 105 mmol/L (ref 98–111)
Creatinine, Ser: 0.97 mg/dL (ref 0.61–1.24)
GFR, Estimated: >60 mL/min (ref >60)
Glucose, Bld: 120 mg/dL — ABNORMAL HIGH (ref 70–99)
Potassium: 4.1 mmol/L (ref 3.5–5.1)
Sodium: 138 mmol/L (ref 135–145)

## 2022-07-09 LAB — TROPONIN I (HIGH SENSITIVITY): Troponin I (High Sensitivity): 5 ng/L (ref <18)

## 2022-07-09 MED ORDER — SERTRALINE 100 MG TABLET
ORAL_TABLET | Freq: Every day | ORAL | 6 refills | 30 days | Status: CP
Start: 2022-07-09 — End: ?

## 2022-07-09 NOTE — ED Triage Notes (Addendum)
Pt BIB [facility] from home, c/o recurrent chest pain, dizziness and shortness of breath. Pt reports stress at work, hx MI and stents in July. Given 324mg  asa.

## 2022-07-09 NOTE — ED Provider Triage Note (Signed)
Emergency Medicine Provider Triage Evaluation Note  Dave [name] , a 46 y.o. male  was evaluated in triage.  Pt complains of chest pain shortness of breath.  Patient reports that around 7:30 PM the symptoms began.  Patient denies any worsening of chest pain or shortness of breath with exertion.  Patient has a history of MI in May with stent placement.  Patient currently being followed by [facility] heart care.  Patient denies any nausea, vomiting, abdominal pain.  Review of Systems  Positive:  Negative:   Physical Exam  There were no vitals taken for this visit. Gen:   Awake, no distress   Resp:  Normal effort  MSK:   Moves extremities without difficulty  Other:    Medical Decision Making  Medically screening exam initiated at 9:55 PM.  Appropriate orders placed.  Yishai Dansby [name] was informed that the remainder of the evaluation will be completed by another provider, this initial triage assessment does not replace that evaluation, and the importance of remaining in the ED until their evaluation is complete.     Azucena Cecil, PA-C 07/09/22 2155

## 2022-07-09 NOTE — Unmapped (Signed)
Refill for zoloft sent to patients [facility] pharmacy.

## 2022-07-10 LAB — TROPONIN I (HIGH SENSITIVITY): Troponin I (High Sensitivity): 5 ng/L (ref <18)

## 2022-07-10 NOTE — Discharge Instructions (Addendum)
It was our pleasure to provide your ER care today - we hope that you feel better.  Overall, your ED tests look good.  If gi symptoms, try pepcid and maalox for symptom relief.  For chest pain, follow up closely with your cardiologist in the coming week.  Return to ER right away if worse, new symptoms, recurrent/persistent chest pain, increased trouble breathing, or other concern.

## 2022-07-10 NOTE — ED Provider Notes (Signed)
[facility] EMERGENCY DEPARTMENT Provider Note   CSN: [id number] Arrival date & time: 07/09/22  2129     History  Chief Complaint  Patient presents with   Chest Pain    Dave [name] is a 46 y.o. male.  Pt with hx cad, indicating at rest last pm onset chest discomfort, mid chest, non radiating, not pleuritic. No other recent chest pain even with exertion. No recent unusual doe or fatigue. Denies nv. Symptoms unlike prior cardiac chest pain when had mi. Denies cough or uri symptoms. No fever or chills. Pt had notes some recent mild indigestion type symptoms, at rest.   The history is provided by the patient, medical records and the EMS personnel.  Chest Pain Associated symptoms: no abdominal pain, no back pain, no cough, no fever, no headache, no palpitations and no shortness of breath        Home Medications Prior to Admission medications   Medication Sig Start Date End Date Taking? Authorizing Provider  amLODipine (NORVASC) 5 MG tablet Take 1 tablet (5 mg total) by mouth daily. 04/16/22   Cheryln Manly, NP  aspirin 81 MG tablet Take 81 mg by mouth daily.    [provider]  atorvastatin (LIPITOR) 80 MG tablet Take 1 tablet (80 mg total) by mouth daily. 04/16/22   Cheryln Manly, NP  Cholecalciferol (VITAMIN D) 1000 UNITS capsule Take 1,000 Units by mouth daily.    [provider]  metoprolol succinate (TOPROL-XL) 25 MG 24 hr tablet Take 0.5 tablets (12.5 mg total) by mouth daily. Take with or immediately following a meal. 04/28/22 04/23/23  Finis Bud, NP  nitroGLYCERIN (NITROSTAT) 0.4 MG SL tablet Place 1 tablet (0.4 mg total) under the tongue every 5 (five) minutes x 3 doses as needed for chest pain. 04/15/22   Cheryln Manly, NP  predniSONE (DELTASONE) 5 MG tablet Take 5 mg by mouth daily with breakfast.    [provider]  Tacrolimus (ENVARSUS XR PO) Take 5 tablets by mouth daily before breakfast.    [provider]  ticagrelor (BRILINTA) 90 MG TABS tablet Take 1 tablet (90 mg total) by mouth 2 (two) times daily. 04/15/22   Cheryln Manly, NP      Allergies    Ace inhibitors    Review of Systems   Review of Systems  Constitutional:  Negative for fever.  HENT:  Negative for sore throat.   Eyes:  Negative for redness.  Respiratory:  Negative for cough and shortness of breath.   Cardiovascular:  Positive for chest pain. Negative for palpitations and leg swelling.  Gastrointestinal:  Negative for abdominal pain.  Genitourinary:  Negative for flank pain.  Musculoskeletal:  Negative for back pain and neck pain.  Skin:  Negative for rash.  Neurological:  Negative for headaches.  Hematological:  Does not bruise/bleed easily.  Psychiatric/Behavioral:  Negative for confusion.     Physical Exam Updated Vital Signs BP 128/71   Pulse 61   Temp 98.9 F (37.2 C)   Resp 14   SpO2 99%  Physical Exam Vitals and nursing note reviewed.  Constitutional:      Appearance: Normal appearance. He is well-developed.  HENT:     Head: Atraumatic.     Nose: Nose normal.     Mouth/Throat:     Mouth: Mucous membranes are moist.  Eyes:     General: No scleral icterus.    Conjunctiva/sclera: Conjunctivae normal.  Neck:  Trachea: No tracheal deviation.  Cardiovascular:     Rate and Rhythm: Normal rate and regular rhythm.     Pulses: Normal pulses.     Heart sounds: Normal heart sounds. No murmur heard.    No friction rub. No gallop.  Pulmonary:     Effort: Pulmonary effort is normal. No accessory muscle usage or respiratory distress.     Breath sounds: Normal breath sounds.  Chest:     Chest wall: No tenderness.  Abdominal:     General: Bowel sounds are normal. There is no distension.     Palpations: Abdomen is soft.     Tenderness: There is no abdominal tenderness.  Genitourinary:    Comments: No cva tenderness. Musculoskeletal:        General: No swelling or tenderness.      Cervical back: Normal range of motion and neck supple. No rigidity.     Right lower leg: No edema.     Left lower leg: No edema.  Skin:    General: Skin is warm and dry.     Findings: No rash.  Neurological:     Mental Status: He is alert.     Comments: Alert, speech clear.   Psychiatric:        Mood and Affect: Mood normal.     ED Results / Procedures / Treatments   Labs (all labs ordered are listed, but only abnormal results are displayed) Results for orders placed or performed during the hospital encounter of 67/61/95  Basic metabolic panel  Result Value Ref Range   Sodium 138 135 - 145 mmol/L   Potassium 4.1 3.5 - 5.1 mmol/L   Chloride 105 98 - 111 mmol/L   CO2 24 22 - 32 mmol/L   Glucose, Bld 120 (H) 70 - 99 mg/dL   BUN 17 6 - 20 mg/dL   Creatinine, Ser 0.97 0.61 - 1.24 mg/dL   Calcium 9.7 8.9 - 10.3 mg/dL   GFR, Estimated >60 >60 mL/min   Anion gap 9 5 - 15  CBC  Result Value Ref Range   WBC 10.4 4.0 - 10.5 K/uL   RBC 4.81 4.22 - 5.81 MIL/uL   Hemoglobin 14.5 13.0 - 17.0 g/dL   HCT 44.6 39.0 - 52.0 %   MCV 92.7 80.0 - 100.0 fL   MCH 30.1 26.0 - 34.0 pg   MCHC 32.5 30.0 - 36.0 g/dL   RDW 14.5 11.5 - 15.5 %   Platelets 270 150 - 400 K/uL   nRBC 0.0 0.0 - 0.2 %  Troponin I (High Sensitivity)  Result Value Ref Range   Troponin I (High Sensitivity) 5 <18 ng/L  Troponin I (High Sensitivity)  Result Value Ref Range   Troponin I (High Sensitivity) 5 <18 ng/L   DG Chest 1 View  Result Date: 07/09/2022 CLINICAL DATA:  Recurrent chest pain, dizziness, and shortness of breath EXAM: CHEST  1 VIEW COMPARISON:  Radiographs 06/12/2022 FINDINGS: No focal consolidation, pleural effusion, or pneumothorax. Normal cardiomediastinal silhouette. No acute osseous abnormality. IMPRESSION: No active disease. Electronically Signed   By: Placido Sou M.D.   On: 07/09/2022 22:29   DG Chest 2 View  Result Date: 06/13/2022 CLINICAL DATA:  Chest pain EXAM: CHEST - 2 VIEW COMPARISON:   Radiographs 04/16/2022 FINDINGS: No focal consolidation, pleural effusion, or pneumothorax. Normal cardiomediastinal silhouette. No acute osseous abnormality. IMPRESSION: No active cardiopulmonary disease. Electronically Signed   By: Placido Sou M.D.   On: 06/13/2022 00:05  ED ECG REPORT   Date: 07/10/2022  Rate: 62  Rhythm: normal sinus rhythm  QRS Axis: normal  Intervals: normal  ST/T Wave abnormalities: nonspecific ST changes  Conduction Disutrbances:none  Narrative Interpretation:   Old EKG Reviewed: unchanged  I have personally reviewed the EKG tracing   Radiology DG Chest 1 View  Result Date: 07/09/2022 CLINICAL DATA:  Recurrent chest pain, dizziness, and shortness of breath EXAM: CHEST  1 VIEW COMPARISON:  Radiographs 06/12/2022 FINDINGS: No focal consolidation, pleural effusion, or pneumothorax. Normal cardiomediastinal silhouette. No acute osseous abnormality. IMPRESSION: No active disease. Electronically Signed   By: Placido Sou M.D.   On: 07/09/2022 22:29    Procedures Procedures    Medications Ordered in ED Medications - No data to display  ED Course/ Medical Decision Making/ A&P                           Medical Decision Making Problems Addressed: Essential hypertension: chronic illness or injury Other hyperlipidemia: chronic illness or injury Precordial chest pain: acute illness or injury with systemic symptoms that poses a threat to life or bodily functions  Amount and/or Complexity of Data Reviewed Independent Historian: EMS    Details: hx External Data Reviewed: labs, radiology and notes. Labs: ordered. Decision-making details documented in ED Course. Radiology: ordered and independent interpretation performed. Decision-making details documented in ED Course. ECG/medicine tests: ordered and independent interpretation performed. Decision-making details documented in ED Course.  Risk Decision regarding hospitalization.   Iv ns. Continuous  pulse ox and cardiac monitoring. Labs ordered/sent. Imaging ordered.   Diff dx incl stemi, acs, msk cp, gi cp, etc - dispo decision including potential need for admission considered - will get labs and imaging and reassess.   Reviewed nursing notes and prior charts for additional history. External reports reviewed. Additional history from: EMS.   Cardiac monitor: sinus rhythm, rate 62.  Labs reviewed/interpreted by me - trop x 2 normal - felt not c/w acs.   Xrays reviewed/interpreted by me - no pna.   Symptoms resolved. Unlike prior cardiac cp. Trop x 2 normal.   Pt currently appears stable for d/c.  Rec close cardiology f/u.  Return precautions provided.          Final Clinical Impression(s) / ED Diagnoses Final diagnoses:  None    Rx / DC Orders ED Discharge Orders     None         Lajean Saver, MD 07/10/22 [phone]

## 2022-07-15 DIAGNOSIS — Z940 Kidney transplant status: Principal | ICD-10-CM

## 2022-07-16 NOTE — Progress Notes (Deleted)
Cardiology Clinic Note   Patient Name: Dave [name] Date of Encounter: 07/16/2022  Primary Care Provider:  Lucinda Dell, MD Primary Cardiologist:  Peter Martinique, MD  Patient Profile    Dave [name] 46 year old male presents to the clinic today for follow-up evaluation of his coronary artery disease and palpitations.  Past Medical History    Past Medical History:  Diagnosis Date   Asthma    Atrial fibrillation (HCC)    uses ASA and fish oil    Depression    GERD (gastroesophageal reflux disease)    Hx of gout    Hyperlipidemia    Hypertension    Kidney failure    NSTEMI (non-ST elevated myocardial infarction) ([place]) 04/14/2022   Renal insufficiency    Past Surgical History:  Procedure Laterality Date   CORONARY STENT INTERVENTION N/A 04/14/2022   Procedure: CORONARY STENT INTERVENTION;  Surgeon: Wellington Hampshire, MD;  Location: [facility];  Service: Cardiovascular;  Laterality: N/A;   CYST EXCISION Left 06/11/2016   Procedure: CYST REMOVAL;  Surgeon: Beverly Gust, MD;  Location: [facility];  Service: ENT;  Laterality: Left;  Left upper forehead   DIALYSIS FISTULA CREATION  2010   INTRAVASCULAR PRESSURE WIRE/FFR STUDY N/A 04/14/2022   Procedure: INTRAVASCULAR PRESSURE WIRE/FFR STUDY;  Surgeon: Wellington Hampshire, MD;  Location: [facility];  Service: Cardiovascular;  Laterality: N/A;   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   LEFT HEART CATH AND CORONARY ANGIOGRAPHY N/A 04/14/2022   Procedure: LEFT HEART CATH AND CORONARY ANGIOGRAPHY;  Surgeon: Wellington Hampshire, MD;  Location: [facility];  Service: Cardiovascular;  Laterality: N/A;    Allergies  Allergies  Allergen Reactions   Ace Inhibitors Nausea Only    History of Present Illness    Dave [name] has a PMH of NSTEMI, HTN, HLD, shortness of breath, renal transplant, and palpitations.  He underwent cardiac catheterization 04/14/2022 and received DES to his mid  LAD.  He was noted to have residual small disease in his ramus and 60% distal RCA.  He presented with emergency department on 06/12/2022 and was discharged on 06/13/2022.  He again presented to the emergency department on 07/09/2022.  He complained of chest discomfort with both visits.  He reported that his pain was unlike his previous MI.  He denied cough and upper respiratory type infections.  His EKG showed normal sinus rhythm 62 bpm with nonspecific ST changes 07/12/2022.  His BMP and CBC were unremarkable.  His high-sensitivity troponins were low and flat.  His chest x-ray showed no acute cardiopulmonary changes.  He reported compliance with his dual antiplatelet therapy.  On previous visit 06/12/2022 long-acting nitrates and stress testing as outpatient were recommended.  He was discharged in stable condition.  He presents to the clinic today for follow-up evaluation and states***  *** denies chest pain, shortness of breath, lower extremity edema, fatigue, palpitations, melena, hematuria, hemoptysis, diaphoresis, weakness, presyncope, syncope, orthopnea, and PND.  NSTEMI-underwent left heart cath with DES to his mid LAD 04/14/2022.  He was also noted to have small vessel residual disease and ramus with 60% distal RCA. Continue amlodipine, aspirin, atorvastatin, metoprolol, Brilinta Heart healthy low-sodium diet-salty 6 given Increase physical activity as tolerated Start Imdur 15 mg daily  Hyperlipidemia-04/15/2022: Cholesterol 188; HDL 37; LDL Cholesterol 133; Triglycerides 91; VLDL 18 Continue aspirin, atorvastatin Heart healthy low-sodium diet-salty 6 given Increase physical activity as tolerated  Essential hypertension-BP today*** Continue metoprolol, amlodipine Heart  healthy low-sodium diet-salty 6 given Increase physical activity as tolerated  History of renal transplant-reports compliance with prednisone and tacrolimus Follows with [facility]  Shortness of breath-stable.  Chronic.  Denies  increased work of breathing.  Fairly sedentary. Increase physical activity as tolerated Follows with PCP  Disposition: Follow-up with Dr. Martinique or me after stress test.  Home Medications    Prior to Admission medications   Medication Sig Start Date End Date Taking? Authorizing Provider  amLODipine (NORVASC) 5 MG tablet Take 1 tablet (5 mg total) by mouth daily. 04/16/22   Cheryln Manly, NP  aspirin 81 MG tablet Take 81 mg by mouth daily.    [provider]  atorvastatin (LIPITOR) 80 MG tablet Take 1 tablet (80 mg total) by mouth daily. 04/16/22   Cheryln Manly, NP  Cholecalciferol (VITAMIN D) 1000 UNITS capsule Take 1,000 Units by mouth daily.    [provider]  metoprolol succinate (TOPROL-XL) 25 MG 24 hr tablet Take 0.5 tablets (12.5 mg total) by mouth daily. Take with or immediately following a meal. 04/28/22 04/23/23  Finis Bud, NP  nitroGLYCERIN (NITROSTAT) 0.4 MG SL tablet Place 1 tablet (0.4 mg total) under the tongue every 5 (five) minutes x 3 doses as needed for chest pain. 04/15/22   Cheryln Manly, NP  predniSONE (DELTASONE) 5 MG tablet Take 5 mg by mouth daily with breakfast.    [provider]  Tacrolimus (ENVARSUS XR PO) Take 5 tablets by mouth daily before breakfast.    [provider]  ticagrelor (BRILINTA) 90 MG TABS tablet Take 1 tablet (90 mg total) by mouth 2 (two) times daily. 04/15/22   Cheryln Manly, NP    Family History    Family History  Problem Relation Age of Onset   Hypertension Father    Hyperlipidemia Father    Heart disease Father        CABG   He indicated that his mother is alive. He indicated that his father is alive. He indicated that his sister is alive.  Social History    Social History   Socioeconomic History   Marital status: Married    Spouse name: Not on file   Number of children: Not on file   Years of education: Not on file   Highest education level: Not on file  Occupational  History   Not on file  Tobacco Use   Smoking status: Former    Packs/day: 0.50    Years: 7.00    Total pack years: 3.50    Types: Cigarettes    Quit date: 05/21/2009    Years since quitting: 13.1   Smokeless tobacco: Never  Substance and Sexual Activity   Alcohol use: No   Drug use: Not on file    Comment: Smoked marijuana for about 10-12 years; quit 2010.   Sexual activity: Not on file  Other Topics Concern   Not on file  Social History Narrative   Not on file   Social Determinants of Health   Financial Resource Strain: Not on file  Food Insecurity: Not on file  Transportation Needs: Not on file  Physical Activity: Not on file  Stress: Not on file  Social Connections: Not on file  Intimate Partner Violence: Not on file     Review of Systems    General:  No chills, fever, night sweats or weight changes.  Cardiovascular:  No chest pain, dyspnea on exertion, edema, orthopnea, palpitations, paroxysmal nocturnal dyspnea. Dermatological: No rash,  lesions/masses Respiratory: No cough, dyspnea Urologic: No hematuria, dysuria Abdominal:   No nausea, vomiting, diarrhea, bright red blood per rectum, melena, or hematemesis Neurologic:  No visual changes, wkns, changes in mental status. All other systems reviewed and are otherwise negative except as noted above.  Physical Exam    VS:  There were no vitals taken for this visit. , BMI There is no height or weight on file to calculate BMI. GEN: Well nourished, well developed, in no acute distress. HEENT: normal. Neck: Supple, no JVD, carotid bruits, or masses. Cardiac: RRR, no murmurs, rubs, or gallops. No clubbing, cyanosis, edema.  Radials/DP/PT 2+ and equal bilaterally.  Respiratory:  Respirations regular and unlabored, clear to auscultation bilaterally. GI: Soft, nontender, nondistended, BS + x 4. MS: no deformity or atrophy. Skin: warm and dry, no rash. Neuro:  Strength and sensation are intact. Psych: Normal  affect.  Accessory Clinical Findings    Recent Labs: 07/09/2022: BUN 17; Creatinine, Ser 0.97; Hemoglobin 14.5; Platelets 270; Potassium 4.1; Sodium 138   Recent Lipid Panel    Component Value Date/Time   CHOL 188 04/15/2022 0207   CHOL 194 01/17/2015 0950   TRIG 91 04/15/2022 0207   TRIG 120 01/17/2015 0950   HDL 37 (L) 04/15/2022 0207   HDL 40 01/17/2015 0950   CHOLHDL 5.1 04/15/2022 0207   VLDL 18 04/15/2022 0207   VLDL 24 01/17/2015 0950   LDLCALC 133 (H) 04/15/2022 0207   LDLCALC 130 (H) 01/17/2015 0950    No BP recorded.  {Refresh Note OR Click here to enter BP  :1}***    ECG personally reviewed by me today- *** - No acute changes  Echocardiogram 04/15/2022 IMPRESSIONS     1. Left ventricular ejection fraction, by estimation, is 55%. The left  ventricle has normal function. The left ventricle has no regional wall  motion abnormalities. Left ventricular diastolic parameters were normal.   2. Right ventricular systolic function is normal. The right ventricular  size is normal. Tricuspid regurgitation signal is inadequate for assessing  PA pressure.   3. The mitral valve is normal in structure. Trivial mitral valve  regurgitation. No evidence of mitral stenosis.   4. The aortic valve is tricuspid. There is mild calcification of the  aortic valve. Aortic valve regurgitation is not visualized. No aortic  stenosis is present.   5. The inferior vena cava is normal in size with <50% respiratory  variability, suggesting right atrial pressure of 8 mmHg.   FINDINGS   Left Ventricle: Left ventricular ejection fraction, by estimation, is  55%. The left ventricle has normal function. The left ventricle has no  regional wall motion abnormalities. The left ventricular internal cavity  size was normal in size. There is no  left ventricular hypertrophy. Left ventricular diastolic parameters were  normal.   Right Ventricle: The right ventricular size is normal. No increase in   right ventricular wall thickness. Right ventricular systolic function is  normal. Tricuspid regurgitation signal is inadequate for assessing PA  pressure.   Left Atrium: Left atrial size was normal in size.   Right Atrium: Right atrial size was normal in size.   Pericardium: There is no evidence of pericardial effusion.   Mitral Valve: The mitral valve is normal in structure. Trivial mitral  valve regurgitation. No evidence of mitral valve stenosis.   Tricuspid Valve: The tricuspid valve is normal in structure. Tricuspid  valve regurgitation is trivial.   Aortic Valve: The aortic valve is tricuspid. There is mild  calcification  of the aortic valve. Aortic valve regurgitation is not visualized. No  aortic stenosis is present. Aortic valve peak gradient measures 7.4 mmHg.   Pulmonic Valve: The pulmonic valve was normal in structure. Pulmonic valve  regurgitation is not visualized.   Aorta: The aortic root is normal in size and structure.   Venous: The inferior vena cava is normal in size with less than 50%  respiratory variability, suggesting right atrial pressure of 8 mmHg.   IAS/Shunts: No atrial level shunt detected by color flow Doppler.       Cardiac catheterization 04/14/2022   Prox RCA lesion is 30% stenosed.   Dist RCA lesion is 60% stenosed.   Ramus lesion is 95% stenosed.   Mid LAD-1 lesion is 75% stenosed.   Mid LAD-2 lesion is 30% stenosed.   Dist LAD lesion is 20% stenosed.   Prox LAD lesion is 30% stenosed.   A drug-eluting stent was successfully placed using a SYNERGY XD 3.50X16.   Post intervention, there is a 0% residual stenosis.   1.  Significant underlying two-vessel coronary artery disease.  The culprit for non-ST elevation myocardial infarction seems to be severe stenosis of the distal ramus.  The vessel is small in diameter in that area and tortuous distally.  Borderline mid LAD stenosis was significant by flow reserve evaluation with an RFR ratio of  0.88.  Moderate distal RCA stenosis. 2.  Left ventricular angiography was not performed to minimize contrast use.  We will obtain an echocardiogram.  Normal left ventricular end-diastolic pressure. 3.  Successful drug-eluting stent placement to the mid LAD.   Recommendations: Dual antiplatelet therapy for at least 1 year. Aggressive treatment of risk factors. The distal ramus is too small to stent.   Assessment & Plan   1.  ***   Jossie Ng. Shahmeer Bunn NP-C     07/16/2022, 6:56 AM [place] [facility] [address] Office [phone] Fax [phone]  Notice: This dictation was prepared with Dragon dictation along with smaller phrase technology. Any transcriptional errors that result from this process are unintentional and may not be corrected upon review.  I spent***minutes examining this patient, reviewing medications, and using patient centered shared decision making involving her cardiac care.  Prior to her visit I spent greater than 20 minutes reviewing her past medical history,  medications, and prior cardiac tests.

## 2022-07-19 ENCOUNTER — Ambulatory Visit: Payer: PRIVATE HEALTH INSURANCE | Admitting: General Practice

## 2022-07-20 ENCOUNTER — Encounter (HOSPITAL_COMMUNITY): Payer: Self-pay

## 2022-07-20 ENCOUNTER — Emergency Department (HOSPITAL_COMMUNITY)
Admission: EM | Admit: 2022-07-20 | Discharge: 2022-07-21 | Disposition: A | Discharge status: Home / Self Care | Payer: PRIVATE HEALTH INSURANCE | Attending: Emergency Medicine | Admitting: Emergency Medicine

## 2022-07-20 ENCOUNTER — Emergency Department (HOSPITAL_COMMUNITY): Payer: PRIVATE HEALTH INSURANCE

## 2022-07-20 DIAGNOSIS — R002 Palpitations: Secondary | ICD-10-CM | POA: Diagnosis not present

## 2022-07-20 DIAGNOSIS — I10 Essential (primary) hypertension: Secondary | ICD-10-CM | POA: Insufficient documentation

## 2022-07-20 DIAGNOSIS — R0602 Shortness of breath: Secondary | ICD-10-CM | POA: Insufficient documentation

## 2022-07-20 DIAGNOSIS — Z7982 Long term (current) use of aspirin: Secondary | ICD-10-CM | POA: Insufficient documentation

## 2022-07-20 DIAGNOSIS — R0789 Other chest pain: Secondary | ICD-10-CM | POA: Insufficient documentation

## 2022-07-20 DIAGNOSIS — Z79899 Other long term (current) drug therapy: Secondary | ICD-10-CM | POA: Diagnosis not present

## 2022-07-20 LAB — BASIC METABOLIC PANEL WITH GFR
Anion gap: 14 (ref 5–15)
BUN: 12 mg/dL (ref 6–20)
CO2: 22 mmol/L (ref 22–32)
Calcium: 10 mg/dL (ref 8.9–10.3)
Chloride: 104 mmol/L (ref 98–111)
Creatinine, Ser: 0.84 mg/dL (ref 0.61–1.24)
GFR, Estimated: >60 mL/min
Glucose, Bld: 116 mg/dL — ABNORMAL HIGH (ref 70–99)
Potassium: 4.4 mmol/L (ref 3.5–5.1)
Sodium: 140 mmol/L (ref 135–145)

## 2022-07-20 LAB — CBC
HCT: 42.2 % (ref 39.0–52.0)
Hemoglobin: 14.1 g/dL (ref 13.0–17.0)
MCH: 30 pg (ref 26.0–34.0)
MCHC: 33.4 g/dL (ref 30.0–36.0)
MCV: 89.8 fL (ref 80.0–100.0)
Platelets: 247 K/uL (ref 150–400)
RBC: 4.7 MIL/uL (ref 4.22–5.81)
RDW: 13.9 % (ref 11.5–15.5)
WBC: 10.5 K/uL (ref 4.0–10.5)
nRBC: 0 % (ref 0.0–0.2)

## 2022-07-20 LAB — TROPONIN I (HIGH SENSITIVITY): Troponin I (High Sensitivity): 5 ng/L (ref <18)

## 2022-07-20 NOTE — ED Triage Notes (Signed)
Pt BIBA from home. Pt was in bed at 1700, starting having chest tightness and SHOB. Pt had an NSTEMI 1 month ago,  did not have EKG changes at the time,stent placed . 324 ASA at home 0.4 nitro at home, pain went from 7/10 to 4/10 0.4 nitro with EMS  18g R AC  114/58 146 CBG 100 HR  99% RA

## 2022-07-20 NOTE — ED Provider Triage Note (Signed)
Emergency Medicine Provider Triage Evaluation Note  Dave [name] , a 46 y.o. male  was evaluated in triage.  Pt complains of sudden onset of shortness of breath and chest pain starting at 1700 today.  Patient was lying in bed when sensation came on.  Felt similar to the chest pain he had a month ago when diagnosed with an NSTEMI and had a cardiac stent placed.  Took 324 mg ASA and 0.4 nitro at home.  Pain diminished but was still present.  Received another 0.4 nitro via EMS.  Patient states pain has much better and now manageable.  Denies recent URI symptoms, fever, and/V/D, abdominal pain, or back pain.  Denies numbness, weakness, or tingling to the lower extremities.  Review of Systems  Positive:  Negative: As above  Physical Exam  BP 112/73 (BP Location: Right Arm)   Pulse 68   Temp 98.5 F (36.9 C) (Oral)   Resp 16   Ht 6\' 1"  (1.854 m)   Wt 99.8 kg   SpO2 100%   BMI 29.03 kg/m  Gen:   Awake, no distress   Resp:  Normal effort, CTAB, equal chest rise MSK:   Moves extremities without difficulty  Other:  Chest non-TTP.  Abdomen soft, non-TTP.  1+ diminished radial pulse of left upper extremity at baseline per patient.  2+ radial pulse of right upper extremity.  Medical Decision Making  Medically screening exam initiated at 6:33 PM.  Appropriate orders placed.  Dave [name] was informed that the remainder of the evaluation will be completed by another provider, this initial triage assessment does not replace that evaluation, and the importance of remaining in the ED until their evaluation is complete.     Prince Rome, PA-C 61/60/73 [id number]

## 2022-07-21 LAB — TROPONIN I (HIGH SENSITIVITY): Troponin I (High Sensitivity): 5 ng/L (ref <18)

## 2022-07-21 NOTE — ED Provider Notes (Signed)
[facility] EMERGENCY DEPARTMENT Provider Note   CSN: [id number] Arrival date & time: 07/20/22  1737     History  Chief Complaint  Patient presents with   Chest Pain    [name] is a 46 y.o. male with past medical history significant for heart palpitations, shortness of breath, recent NSTEMI around 3 months ago, hypertension, hyperlipidemia who presents with concern for chest tightness, shortness of breath that began last night at around 5 PM.  Patient reports that he was in bed at the time, denies exertional chest pain.  He reports that when he was having his NSTEMI it felt like there was pressure on his chest and at this time it did not feel like that, he reports no nausea, vomiting, radiation to back, neck.  Patient took nitroglycerin at home and pain went from 7-4, had an additional nitroglycerin, he reports that shortly after arrival all of the chest tightness and shortness of breath resolved.  He is compliant with his Norvasc, Lipitor, Toprol, and Brilinta as well as aspirin at home.  He reports he is a follow-up appoint with cardiology on November 30.  At time my evaluation after protracted time in the waiting room patient reports that he is totally chest pain, shortness of breath free at this time.   Chest Pain      Home Medications Prior to Admission medications   Medication Sig Start Date End Date Taking? Authorizing Provider  amLODipine (NORVASC) 5 MG tablet Take 1 tablet (5 mg total) by mouth daily. 04/16/22   Cheryln Manly, NP  aspirin 81 MG tablet Take 81 mg by mouth daily.    [provider]  atorvastatin (LIPITOR) 80 MG tablet Take 1 tablet (80 mg total) by mouth daily. 04/16/22   Cheryln Manly, NP  Cholecalciferol (VITAMIN D) 1000 UNITS capsule Take 1,000 Units by mouth daily.    [provider]  metoprolol succinate (TOPROL-XL) 25 MG 24 hr tablet Take 0.5 tablets (12.5 mg total) by mouth daily. Take with or  immediately following a meal. 04/28/22 04/23/23  Finis Bud, NP  nitroGLYCERIN (NITROSTAT) 0.4 MG SL tablet Place 1 tablet (0.4 mg total) under the tongue every 5 (five) minutes x 3 doses as needed for chest pain. 04/15/22   Cheryln Manly, NP  predniSONE (DELTASONE) 5 MG tablet Take 5 mg by mouth daily with breakfast.    [provider]  Tacrolimus (ENVARSUS XR PO) Take 5 tablets by mouth daily before breakfast.    [provider]  ticagrelor (BRILINTA) 90 MG TABS tablet Take 1 tablet (90 mg total) by mouth 2 (two) times daily. 04/15/22   Cheryln Manly, NP      Allergies    Ace inhibitors    Review of Systems   Review of Systems  Cardiovascular:  Positive for chest pain.  All other systems reviewed and are negative.   Physical Exam Updated Vital Signs BP 128/84 (BP Location: Right Arm)   Pulse 65   Temp 97.7 F (36.5 C)   Resp 15   Ht 6\' 1"  (1.854 m)   Wt 99.8 kg   SpO2 99%   BMI 29.03 kg/m  Physical Exam Vitals and nursing note reviewed.  Constitutional:      General: He is not in acute distress.    Appearance: Normal appearance.  HENT:     Head: Normocephalic and atraumatic.  Eyes:     General:  Right eye: No discharge.        Left eye: No discharge.  Cardiovascular:     Rate and Rhythm: Normal rate and regular rhythm.     Heart sounds: No murmur heard.    No friction rub. No gallop.  Pulmonary:     Effort: Pulmonary effort is normal.     Breath sounds: Normal breath sounds.  Chest:     Comments: No tenderness to palpation of chest wall at time my evaluation Abdominal:     General: Bowel sounds are normal.     Palpations: Abdomen is soft.  Skin:    General: Skin is warm and dry.     Capillary Refill: Capillary refill takes less than 2 seconds.  Neurological:     Mental Status: He is alert and oriented to person, place, and time.  Psychiatric:        Mood and Affect: Mood normal.        Behavior: Behavior normal.     ED  Results / Procedures / Treatments   Labs (all labs ordered are listed, but only abnormal results are displayed) Labs Reviewed  BASIC METABOLIC PANEL - Abnormal; Notable for the following components:      Result Value   Glucose, Bld 116 (*)    All other components within normal limits  CBC  TROPONIN I (HIGH SENSITIVITY)  TROPONIN I (HIGH SENSITIVITY)    EKG None  Radiology DG Chest 2 View  Result Date: 07/20/2022 CLINICAL DATA:  Chest pain EXAM: CHEST - 2 VIEW COMPARISON:  Chest 07/09/2022 FINDINGS: The heart size and mediastinal contours are within normal limits. Both lungs are clear. The visualized skeletal structures are unremarkable. IMPRESSION: No active cardiopulmonary disease. Electronically Signed   By: Marlan Palau M.D.   On: 07/20/2022 19:03    Procedures Procedures    Medications Ordered in ED Medications - No data to display  ED Course/ Medical Decision Making/ A&P                           Medical Decision Making  This patient is a 46 y.o. male who presents to the ED for concern of chest tightness, shortness of breath, this involves an extensive number of treatment options, and is a complaint that carries with it a high risk of complications and morbidity. The emergent differential diagnosis prior to evaluation includes, but is not limited to,  ACS, AAS, PE, Mallory-Weiss, Boerhaave's, Pneumonia, acute bronchitis, asthma or COPD exacerbation, anxiety, MSK pain or traumatic injury to the chest, acid reflux versus other .   This is not an exhaustive differential.   Past Medical History / Co-morbidities / Social History: Hypertension, hyperlipidemia, previous NSTEMI with stent placement  Additional history: Chart reviewed. Pertinent results include: Extensively reviewed lab work, imaging from previous emergency department visits, outpatient cardiology visits, as well as previous cardiology echo, cath  Physical Exam: Physical exam performed. The pertinent  findings include: My exam patient is in no acute distress, with stable vital signs, he is expressing some anxiety, worry about chest pain, chest tightness, and whether it could be tied to stress  Lab Tests: I ordered, and personally interpreted labs.  The pertinent results include: CBC unremarkable, BMP notable for very mild hyperglycemia glucose 116, troponin are flat at 5x2, chest pain resolved at time of my evaluation   Imaging Studies: I ordered imaging studies including plain film chest x-ray. I independently visualized and interpreted imaging which showed  no intrathoracic abnormality. I agree with the radiologist interpretation.   Cardiac Monitoring:  The patient was maintained on a cardiac monitor.  My attending physician Dr. Lavonna Rua viewed and interpreted the cardiac monitored which showed an underlying rhythm of: Normal sinus rhythm, no significant change from baseline. I agree with this interpretation.   Disposition: After consideration of the diagnostic results and the patients response to treatment, I feel that this is an overall well-appearing 46 year old male who presents with concern for chest pain that he describes more as a chest tightness, and shortness of breath.  At time my evaluation the chest pain is long resolved, based on his description it sounds like his symptoms today were more consistent with possible anxiety, versus nonischemic chest tightness, he denied any feeling of pressure across his chest.  As his work-up is reassuring today and he has a cardiologist at this time I do not think that he needs any additional treatment in the emergency department, encouraged him to call his cardiologist to see if they would like to follow-up with him sooner than his November 30 appointment, and discussed extensive return precautions.   emergency department workup does not suggest an emergent condition requiring admission or immediate intervention beyond what has been performed at this  time. The plan is: as above. The patient is safe for discharge and has been instructed to return immediately for worsening symptoms, change in symptoms or any other concerns.  I discussed this case with my attending physician Dr. Theresia Lo who cosigned this note including patient's presenting symptoms, physical exam, and planned diagnostics and interventions. Attending physician stated agreement with plan or made changes to plan which were implemented.    Final Clinical Impression(s) / ED Diagnoses Final diagnoses:  Chest tightness  Shortness of breath    Rx / DC Orders ED Discharge Orders     None         Olene Floss, PA-C 07/21/22 0919    Rexford Maus, DO 07/21/22 1656

## 2022-07-21 NOTE — Discharge Instructions (Signed)
Call your cardiologist to see if they would like to schedule you for an appointment sooner than November 30, in the meantime continue take your home medications, and monitor your symptoms.  You can return to normal work as tolerated, continue to do rehab exercises at home for cardiac health, please return to the emergency department if you have return of chest pain, shortness of breath

## 2022-07-23 NOTE — Unmapped (Signed)
[facility] Refill Coordination Note    Specialty Medication(s) to be Shipped:   Transplant: Prednisone 5mg     Other medication(s) to be shipped: No additional medications requested for fill at this time     [name], DOB: 1976-02-15  Phone: [phone] (home)       All above HIPAA information was verified with patient.     Was a Nurse, learning disability used for this call? No    Completed refill call assessment today to schedule patient's medication shipment from the [facility] [phone]).  All relevant notes have been reviewed.     Specialty medication(s) and dose(s) confirmed: Regimen is correct and unchanged.   Changes to medications: Jaxyn reports no changes at this time.  Changes to insurance: No  New side effects reported not previously addressed with a pharmacist or physician: None reported  Questions for the pharmacist: No    Confirmed patient received a Conservation officer, historic buildings and a Surveyor, mining with first shipment. The patient will receive a drug information handout for each medication shipped and additional FDA Medication Guides as required.       DISEASE/MEDICATION-SPECIFIC INFORMATION        N/A    SPECIALTY MEDICATION ADHERENCE     Medication Adherence    Patient reported X missed doses in the last month: 0  Specialty Medication: predniSONE 5 MG tablet (DELTASONE)  Patient is on additional specialty medications: No  Patient is on more than two specialty medications: No      Adherence tools used: patient uses a pill box to manage medications                          Were doses missed due to medication being on hold? No    predniSONE 5  mg: 10 days of medicine on hand       REFERRAL TO PHARMACIST     Referral to the pharmacist: Not needed      [facility]     Shipping address confirmed in Epic.     Delivery Scheduled: Yes, Expected medication delivery date: 07/30/22.     Medication will be delivered via UPS to the prescription address in Epic WAM.    Ernestine Mcmurray   [facility] Specialty Technician

## 2022-07-30 MED FILL — PREDNISONE 5 MG TABLET: ORAL | 30 days supply | Qty: 30 | Fill #7

## 2022-08-02 ENCOUNTER — Ambulatory Visit: Payer: Self-pay | Admitting: Cardiology

## 2022-08-18 NOTE — Progress Notes (Deleted)
Cardiology Office Note:    Date:  08/18/2022   ID:  Dave [name] [birth year], MRN [id number]  PCP:  [name], MD  Cardiologist:  Peter Swaziland, MD  Electrophysiologist:  None   Referring MD: [name], MD   Chief Complaint: follow-up of CAD  History of Present Illness:    Dave [name] is a 46 y.o. male with a history of CAD with NSTEMI in 03/2022 s/p DES to mid LAD, possible atrial fibrillation not on anticoagulation,  hypertension, hyperlipidemia, GERD, and s/p renal transplant in 2015 on Tracolimus and Prednisone who is followed by Dr. Swaziland and presents today for follow-up of CAD.  Patient previous followed by [name] following his renal transplant in 2015. He had a nuclear stress test in 2010 which showed no evidence of ischemia. Echo in 2014 showed LVEF of >55% with no significant valvular disease. He was seen by Cardiology in 04/2017 in regards to concerns for possible atrial fibrillation after an ED visit for palpitations. He was reportedly told he had atrial fibrillation in the ED and was placed on Aspirin. He was seen in the A.Fib Clinic following this and Zio monitor was placed which did not show any evidence of atrial fibrillation. He was continued on Aspirin as his CHA2DS2-VASc score was 1.   He was recently admitted at [name] from 04/13/2022 to 04/15/2022 for a NSTEMI after presenting with chest pain and palpitations. EKG showed T wave inversions in lead [name] and aVR and biphasic T waves in aVF. High-sensitivity troponin peaked at 4,440. LHC showed 95% stenosis of the distal ramus (culprit lesion), 20% stenosis of the proximal LAD, 75% stenosis followed by 30% stenosis of the mid LAD, 20% stenosis of distal LAD, 30% stenosis of proximal RCA, and 60% stenosis of distal RCA. The borderline mid LAD stenosis was significant by flow reserve evaluation with an RFR ratio of 0.88. He underwent successful DES to this LAD lesion. The distal ramus lesion was  too small for PCI. Echo showed LVEF of 55% with normal wall motion and diastolic parameters and no significant valvular disease. He was started on DAPT with Aspirin and Brilinta as well as a high-intensity statin. He was discharged on 04/15/2022 but then presented back to the ED the following day for palpitations with associated chest pain that occurred about 30 minutes after returning from a short walk (no symptoms during walk). Symptoms were similar in quality but less intense than the one he had before PCI. He took one dose of sublingual Nitro and called EMS. By the time he arrived to the ED, he was chest pain free. EKG showed normal sinus rhythm no acute changes. High-sensitivity troponin was trending down from recent admission. Patient was able to ambulate in the ED without any problems and was felt to be stable for discharge. Live Zio monitor was placed prior to discharge and showed 17 brief runs of SVT (longest run 13 beats) and rare ectopy but no significant arrhythmias.    Patient was last seen by Sharlene Dory, NP, in 04/2022 at which time he was doing well with no recurrent chest pain or shortness of breath.  Since last visit, he has been seen in the ED on 3 separate occasions for chest pain, mostly recently on 07/20/2022. During each of these visits, chest pain was felt to be rather atypical and work-up was unremarkable (no acute ischemic changes on EKG and negative troponin). Anxiety was felt to possibly be playing a  role.   Patient presents today for follow-up. ***  CAD  History of  NSTEMI in 03/2022 s/p DES to mid LAD. Culprit lesion was felt to be a severe stenosis in the distal ramus but this was too small for PCI. Since then he has had multiple ED visits for chest pain felt to me rather atypical. Cardiac work-up negative each time (no acute ischemic changes on EKG and negative troponin). Anxiety felt to possibly be playing a role.  - No chest pain. *** - Continue DAPT with Aspirin and  Brilinta. *** - Continue beta-blocker and high-intensity statin.  Palpitations Possible Atrial Fibrillation Patient has a questionable history of atrial fibrillation. Monitor in 03/2022 showed showed 17 brief runs of SVT (longest run 13 beats) and rare ectopy but no atrial fibrillation or significant arrhythmias.   - Maintaining sinus rhythm on exam. *** - Continue Toprol-XL 12.5mg  daily. - CHA2DS2-VASc = 2 (CAD, HTN). Not on anticoagulation given no evidence of documented atrial fibrillation. If he has any documented episodes of this, would start Eliquis.   Hypertension BP well controlled *** - Continue Amlodipine 5mg  daily and Toprol-XL 12.5mg  daily.  Hyperlipidemia Lipid panel in 03/2022 during admission for MI: Total Cholesterol 188, Triglycerides 91, HDL 37, LDL 133. - Started on Lipitor 80mg  daily during admission. Continue. - Will recheck lipid panel and LFTs. ***  S/p Renal Transplant in 2015 Creatinine 0.84 and GFR >60 on most recently labs in 06/2022. - On Tracrolimus and Prednisone.   Past Medical History:  Diagnosis Date   Asthma    Atrial fibrillation (HCC)    uses ASA and fish oil    Depression    GERD (gastroesophageal reflux disease)    Hx of gout    Hyperlipidemia    Hypertension    Kidney failure    NSTEMI (non-ST elevated myocardial infarction) (HCC) 04/14/2022   Renal insufficiency     Past Surgical History:  Procedure Laterality Date   CORONARY STENT INTERVENTION N/A 04/14/2022   Procedure: CORONARY STENT INTERVENTION;  Surgeon: 04/16/2022, MD;  Location: [facility];  Service: Cardiovascular;  Laterality: N/A;   CYST EXCISION Left 06/11/2016   Procedure: CYST REMOVAL;  Surgeon: [name], MD;  Location: [facility];  Service: ENT;  Laterality: Left;  Left upper forehead   DIALYSIS FISTULA CREATION  2010   INTRAVASCULAR PRESSURE WIRE/FFR STUDY N/A 04/14/2022   Procedure: INTRAVASCULAR PRESSURE WIRE/FFR STUDY;  Surgeon: 2011, MD;  Location: [facility];  Service: Cardiovascular;  Laterality: N/A;   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   LEFT HEART CATH AND CORONARY ANGIOGRAPHY N/A 04/14/2022   Procedure: LEFT HEART CATH AND CORONARY ANGIOGRAPHY;  Surgeon: 07/23/2014, MD;  Location: [facility];  Service: Cardiovascular;  Laterality: N/A;    Current Medications: No outpatient medications have been marked as taking for the 08/23/22 encounter (Appointment) with [name], PA-C.     Allergies:   Ace inhibitors   Social History   Socioeconomic History   Marital status: Married    Spouse name: Not on file   Number of children: Not on file   Years of education: Not on file   Highest education level: Not on file  Occupational History   Not on file  Tobacco Use   Smoking status: Former    Packs/day: 0.50    Years: 7.00    Total pack years: 3.50    Types: Cigarettes    Quit date:  05/21/2009    Years since quitting: 13.2   Smokeless tobacco: Never  Substance and Sexual Activity   Alcohol use: No   Drug use: Not on file    Comment: Smoked marijuana for about 10-12 years; quit 2010.   Sexual activity: Not on file  Other Topics Concern   Not on file  Social History Narrative   Not on file   Social Determinants of Health   Financial Resource Strain: Not on file  Food Insecurity: Not on file  Transportation Needs: Not on file  Physical Activity: Not on file  Stress: Not on file  Social Connections: Not on file     Family History: The patient's family history includes Heart disease in his father; Hyperlipidemia in his father; Hypertension in his father.  ROS:   Please see the history of present illness.     EKGs/Labs/Other Studies Reviewed:    The following studies were reviewed:  Left Cardiac Catheterization 04/14/2022:   Prox RCA lesion is 30% stenosed.   Dist RCA lesion is 60% stenosed.   Ramus lesion is 95% stenosed.   Mid LAD-1 lesion is 75%  stenosed.   Mid LAD-2 lesion is 30% stenosed.   Dist LAD lesion is 20% stenosed.   Prox LAD lesion is 30% stenosed.   A drug-eluting stent was successfully placed using a SYNERGY XD [id number].   Post intervention, there is a 0% residual stenosis.   1.  Significant underlying two-vessel coronary artery disease.  The culprit for non-ST elevation myocardial infarction seems to be severe stenosis of the distal ramus.  The vessel is small in diameter in that area and tortuous distally.  Borderline mid LAD stenosis was significant by flow reserve evaluation with an RFR ratio of 0.88.  Moderate distal RCA stenosis. 2.  Left ventricular angiography was not performed to minimize contrast use.  We will obtain an echocardiogram.  Normal left ventricular end-diastolic pressure. 3.  Successful drug-eluting stent placement to the mid LAD.   Recommendations: Dual antiplatelet therapy for at least 1 year. Aggressive treatment of risk factors. The distal ramus is too small to stent.  Diagnostic Dominance: Right  Intervention    _______________  Echocardiogram 04/15/2022: Impressions: 1. Left ventricular ejection fraction, by estimation, is 55%. The left  ventricle has normal function. The left ventricle has no regional wall  motion abnormalities. Left ventricular diastolic parameters were normal.   2. Right ventricular systolic function is normal. The right ventricular  size is normal. Tricuspid regurgitation signal is inadequate for assessing  PA pressure.   3. The mitral valve is normal in structure. Trivial mitral valve  regurgitation. No evidence of mitral stenosis.   4. The aortic valve is tricuspid. There is mild calcification of the  aortic valve. Aortic valve regurgitation is not visualized. No aortic  stenosis is present.   5. The inferior vena cava is normal in size with <50% respiratory  variability, suggesting right atrial pressure of 8 mmHg.  _______________  [facility] [employer] Monitor 04/16/2022  to 04/24/2022: Patient had a min HR of 47 bpm, max HR of 140 bpm, and avg HR of 68 bpm. Predominant underlying rhythm was Sinus Rhythm. 17 Supraventricular Tachycardia runs occurred, the run with the fastest interval lasting 7 beats with a max rate of 140 bpm, the  longest lasting 13 beats with an avg rate of 102 bpm. Isolated SVEs were rare (<1.0%), SVE Couplets were rare (<1.0%), and SVE Triplets were rare (<1.0%). Isolated VEs were rare (<1.0%), and no VE  Couplets or VE Triplets were present.     Normal sinus rhythm   17 brief runs of SVT - longest 13 beats   Otherwise rare ectopy   EKG:  EKG not ordered today.   Recent Labs: 07/20/2022: BUN 12; Creatinine, Ser 0.84; Hemoglobin 14.1; Platelets 247; Potassium 4.4; Sodium 140  Recent Lipid Panel    Component Value Date/Time   CHOL 188 04/15/2022 0207   CHOL 194 01/17/2015 0950   TRIG 91 04/15/2022 0207   TRIG 120 01/17/2015 0950   HDL 37 (L) 04/15/2022 0207   HDL 40 01/17/2015 0950   CHOLHDL 5.1 04/15/2022 0207   VLDL 18 04/15/2022 0207   VLDL 24 01/17/2015 0950   LDLCALC 133 (H) 04/15/2022 0207   LDLCALC 130 (H) 01/17/2015 0950    Physical Exam:    Vital Signs: There were no vitals taken for this visit.    Wt Readings from Last 3 Encounters:  07/20/22 220 lb (99.8 kg)  06/12/22 220 lb (99.8 kg)  04/28/22 217 lb 3.2 oz (98.5 kg)     General: 46 y.o. male in no acute distress. HEENT: Normocephalic and atraumatic. Sclera clear. EOMs intact. Neck: Supple. No carotid bruits. No JVD. Heart: *** RRR. Distinct S1 and S2. No murmurs, gallops, or rubs. Radial and distal pedal pulses 2+ and equal bilaterally. Lungs: No increased work of breathing. Clear to ausculation bilaterally. No wheezes, rhonchi, or rales.  Abdomen: Soft, non-distended, and non-tender to palpation. Bowel sounds present in all 4 quadrants.  MSK: Normal strength and tone for age. *** Extremities: No lower extremity edema.    Skin: Warm and dry. Neuro: Alert  and oriented x3. No focal deficits. Psych: Normal affect. Responds appropriately.   Assessment:    No diagnosis found.  Plan:     Disposition: Follow up in ***   Medication Adjustments/Labs and Tests Ordered: Current medicines are reviewed at length with the patient today.  Concerns regarding medicines are outlined above.  No orders of the defined types were placed in this encounter.  No orders of the defined types were placed in this encounter.   There are no Patient Instructions on file for this visit.   Signed, Corrin ParkerCallie E Margeaux Swantek, PA-C  08/18/2022 11:35 AM    [facility]

## 2022-08-20 NOTE — Unmapped (Signed)
[facility] Refill Coordination Note    Specialty Medication(s) to be Shipped:   Transplant: Prednisone 5mg     Other medication(s) to be shipped: No additional medications requested for fill at this time     [name], DOB: 1976-08-20  Phone: [phone] (home)       All above HIPAA information was verified with patient.     Was a Nurse, learning disability used for this call? No    Completed refill call assessment today to schedule patient's medication shipment from the [facility] [phone]).  All relevant notes have been reviewed.     Specialty medication(s) and dose(s) confirmed: Regimen is correct and unchanged.   Changes to medications: Conrado reports no changes at this time.  Changes to insurance: No  New side effects reported not previously addressed with a pharmacist or physician: None reported  Questions for the pharmacist: No    Confirmed patient received a Conservation officer, historic buildings and a Surveyor, mining with first shipment. The patient will receive a drug information handout for each medication shipped and additional FDA Medication Guides as required.       DISEASE/MEDICATION-SPECIFIC INFORMATION        N/A    SPECIALTY MEDICATION ADHERENCE     Medication Adherence    Patient reported X missed doses in the last month: 0  Specialty Medication: predniSONE 5 MG tablet (DELTASONE)  Patient is on additional specialty medications: No      Adherence tools used: patient uses a pill box to manage medications                          Were doses missed due to medication being on hold? No    prednisone 5 mg: 10 days of medicine on hand     REFERRAL TO PHARMACIST     Referral to the pharmacist: Not needed      [facility]     Shipping address confirmed in Epic.     Delivery Scheduled: Yes, Expected medication delivery date: 08/26/22.     Medication will be delivered via UPS to the prescription address in Epic WAM.    Quintella Reichert   [facility] Specialty Technician

## 2022-08-20 NOTE — Progress Notes (Deleted)
Cardiology Clinic Note   Patient Name: Dave [name] Date of Encounter: 08/20/2022  Primary Care Provider:  [name], MD Primary Cardiologist:  Peter Swaziland, MD  Patient Profile    Dave [name] is a 46 y.o. male with a past medical history of CAD s/p DES mid LAD July 2023, hypertension, hyperlipidemia, palpitations with PSVT per Zio, renal transplant 2015, and atypical chest who presents to the clinic today for hospital follow-up.   Past Medical History    Past Medical History:  Diagnosis Date   Asthma    Atrial fibrillation (HCC)    uses ASA and fish oil    Depression    GERD (gastroesophageal reflux disease)    Hx of gout    Hyperlipidemia    Hypertension    Kidney failure    NSTEMI (non-ST elevated myocardial infarction) (HCC) 04/14/2022   Renal insufficiency    Past Surgical History:  Procedure Laterality Date   CORONARY STENT INTERVENTION N/A 04/14/2022   Procedure: CORONARY STENT INTERVENTION;  Surgeon: [name], MD;  [facility]: [facility];  Service: Cardiovascular;  Laterality: N/A;   CYST EXCISION Left 06/11/2016   Procedure: CYST REMOVAL;  Surgeon: [name], MD;  Location: [facility];  Service: ENT;  Laterality: Left;  Left upper forehead   DIALYSIS FISTULA CREATION  2010   INTRAVASCULAR PRESSURE WIRE/FFR STUDY N/A 04/14/2022   Procedure: INTRAVASCULAR PRESSURE WIRE/FFR STUDY;  Surgeon: [name], MD;  Location: [facility];  Service: Cardiovascular;  Laterality: N/A;   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   LEFT HEART CATH AND CORONARY ANGIOGRAPHY N/A 04/14/2022   Procedure: LEFT HEART CATH AND CORONARY ANGIOGRAPHY;  Surgeon: [name], MD;  Location: [facility];  Service: Cardiovascular;  Laterality: N/A;    Allergies  Allergies  Allergen Reactions   Ace Inhibitors Nausea Only    History of Present Illness    Dave [name] has a past medical history of: CAD.  LHC  (NSTEMI) 04/14/2022: Proximal RCA 30%, distal RCA 60%, RI 95%, mid LAD #1 75%, mid LAD #2 30%, distal LAD 20%, proximal LAD 30%. DES 3.5x16 to mid LAD. Distal RI too small to stent. DAPT x 1 year, aggressive risk factor modification.  Echo 04/15/2022: EF 55%. Trivial MR. Mild calcification of aortic valve.  Palpitations.  Outpatient telemetry monitor 2014 showed sinus tachycardia and no arrhythmias.  Reported history of afib after kidney transplant in 2015 for which patient was told to take aspirin.  Zio 2018 through [facility] did not show afib.  7-day Zio 04/16/2022: NSR. 17 brief runs of SVT longest 13 beats. Rare ectopy.   Hypertension.  Hyperlipidemia. Lipid panel 04/15/2022: LDL 133, HDL 37, TG 91, total 188. LPa 04/15/2022: 238.2.   Renal transplant 2015.   Patient was first seen by cardiology by Dr. Kirke Corin in 2014 for evaluation of palpitations. He underwent outpatient telemetry which showed sinus tachycardia. He was not seen by cardiology again until 04/14/2022 after presenting to the ED with chest pain. Troponin trended up 70>>4440. He underwent catheterization and PCI with DES to mid LAD. Patient returned to the ED via EMS the day after discharge for chest pain and associated palpitations. He was discharged with live Zio.    He was last seen in the office by Sharlene Dory, NP on 04/28/2022. He was doing well at that visit without complaints of chest pain or shortness of breath. 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   Since his last office visit, patient was seen in the ED on three separate occasions for chest pain in September and October. Each time pain was associated with shortness of breath and eased with nitro; no EKG changes, troponin x 2 were negative. Chest pain presentation was thought to be atypical in nature with possible anxiety/stress component.   Today, patient ***  CP? SHOB? Possibly change brilinta? Repeat lipids and LFTs?   CAD/chest pain. Patient is status  post NSTEMI in July 2023, PCI with DES to mid LAD. He has presented to the ED x 3 with shortness of breath and chest pain in September and October. Patient *** Continue Brilinta, aspirin, atorvastatin, prn SL NTG and Metoprolol.  Palpitations. Seven day Zio in July 2023 showed NSR with 17 brief runs of SVT with the longest 13 beats. Patient *** Continue Metoprolol.  Hypertension. Today, patient's BP *** Hyperlipidemia. Lipid panel in July 2023 showed LDL of 133. He is currently on Atorvastatin 80 mg. Will recheck lipids and LFTs today. ***   Home Medications    No outpatient medications have been marked as taking for the 08/23/22 encounter (Appointment) with Corrin Parker, PA-C.    Family History    Family History  Problem Relation Age of Onset   Hypertension Father    Hyperlipidemia Father    Heart disease Father        CABG   He indicated that his mother is alive. He indicated that his father is alive. He indicated that his sister is alive.   Social History    Social History   Socioeconomic History   Marital status: Married    Spouse name: Not on file   Number of children: Not on file   Years of education: Not on file   Highest education level: Not on file  Occupational History   Not on file  Tobacco Use   Smoking status: Former    Packs/day: 0.50    Years: 7.00    Total pack years: 3.50    Types: Cigarettes    Quit date: 05/21/2009    Years since quitting: 13.2   Smokeless tobacco: Never  Substance and Sexual Activity   Alcohol use: No   Drug use: Not on file    Comment: Smoked marijuana for about 10-12 years; quit 2010.   Sexual activity: Not on file  Other Topics Concern   Not on file  Social History Narrative   Not on file   Social Determinants of Health   Financial Resource Strain: Not on file  Food Insecurity: Not on file  Transportation Needs: Not on file  Physical Activity: Not on file  Stress: Not on file  Social Connections: Not on file   Intimate Partner Violence: Not on file     Review of Systems    General: *** No chills, fever, night sweats or weight changes.  Cardiovascular:  No chest pain, dyspnea on exertion, edema, orthopnea, palpitations, paroxysmal nocturnal dyspnea. Dermatological: No rash, lesions/masses Respiratory: No cough, dyspnea Urologic: No hematuria, dysuria Abdominal:   No nausea, vomiting, diarrhea, bright red blood per rectum, melena, or hematemesis Neurologic:  No visual changes, weakness, changes in mental status. All other systems reviewed and are otherwise negative except as noted above.  Physical Exam    VS:  There were no vitals taken for this visit. , BMI There is no height or weight on file to calculate BMI. GEN: *** Well nourished, well developed,  in no acute distress. HEENT: Normal. Neck: Supple, no JVD, carotid bruits, or masses. Cardiac: RRR, no murmurs, rubs, or gallops. No clubbing, cyanosis, edema.  Radials/DP/PT 2+ and equal bilaterally.  Respiratory:  Respirations regular and unlabored, clear to auscultation bilaterally. GI: Soft, nontender, nondistended. MS: No deformity or atrophy. Skin: Warm and dry, no rash. Neuro: Strength and sensation are intact. Psych: Normal affect.  Accessory Clinical Findings    The following studies were reviewed for this visit: ***  Recent Labs: 07/20/2022: BUN 12; Creatinine, Ser 0.84; Hemoglobin 14.1; Platelets 247; Potassium 4.4; Sodium 140   Recent Lipid Panel    Component Value Date/Time   CHOL 188 04/15/2022 0207   CHOL 194 01/17/2015 0950   TRIG 91 04/15/2022 0207   TRIG 120 01/17/2015 0950   HDL 37 (L) 04/15/2022 0207   HDL 40 01/17/2015 0950   CHOLHDL 5.1 04/15/2022 0207   VLDL 18 04/15/2022 0207   VLDL 24 01/17/2015 0950   LDLCALC 133 (H) 04/15/2022 0207   LDLCALC 130 (H) 01/17/2015 0950    No BP recorded.  {Refresh Note OR Click here to enter BP  :1}***    ECG personally reviewed by me today: ***  No significant  changes from ***  {Does this patient have ATRIAL FIBRILLATION?:[phone]}   Assessment & Plan   ***      Disposition: ***   Etta Grandchild. Melik Blancett, NP-C     08/20/2022, 5:37 PM [facility] [address] Office [phone] Fax [phone]   I spent *** minutes examining this patient, reviewing medications, and using patient centered shared decision making involving her cardiac care.  Prior to her visit I spent greater than 20 minutes reviewing her past medical history,  medications, and prior cardiac tests.

## 2022-08-23 ENCOUNTER — Ambulatory Visit: Payer: PRIVATE HEALTH INSURANCE | Attending: Student | Admitting: Student

## 2022-08-25 MED FILL — PREDNISONE 5 MG TABLET: ORAL | 30 days supply | Qty: 30 | Fill #8

## 2022-08-30 ENCOUNTER — Encounter: Payer: Self-pay | Admitting: Student

## 2022-09-05 NOTE — Progress Notes (Unsigned)
Cardiology Clinic Note   Patient Name: Dave [name] Date of Encounter: 09/07/2022  Primary Care Provider:  [name], MD Primary Cardiologist:  Peter Swaziland, MD  Patient Profile    Dave [name] 46 year old male presents to the clinic today for follow-up evaluation of his chest pain.  Past Medical History    Past Medical History:  Diagnosis Date   Asthma    Atrial fibrillation (HCC)    uses ASA and fish oil    Depression    GERD (gastroesophageal reflux disease)    Hx of gout    Hyperlipidemia    Hypertension    Kidney failure    NSTEMI (non-ST elevated myocardial infarction) (HCC) 04/14/2022   Renal insufficiency    Past Surgical History:  Procedure Laterality Date   CORONARY STENT INTERVENTION N/A 04/14/2022   Procedure: CORONARY STENT INTERVENTION;  Surgeon: [name], MD;  [facility]: [facility];  Service: Cardiovascular;  Laterality: N/A;   CYST EXCISION Left 06/11/2016   Procedure: CYST REMOVAL;  Surgeon: [name], MD;  Location: [facility];  Service: ENT;  Laterality: Left;  Left upper forehead   DIALYSIS FISTULA CREATION  2010   INTRAVASCULAR PRESSURE WIRE/FFR STUDY N/A 04/14/2022   Procedure: INTRAVASCULAR PRESSURE WIRE/FFR STUDY;  Surgeon: [name], MD;  Location: [facility];  Service: Cardiovascular;  Laterality: N/A;   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   LEFT HEART CATH AND CORONARY ANGIOGRAPHY N/A 04/14/2022   Procedure: LEFT HEART CATH AND CORONARY ANGIOGRAPHY;  Surgeon: [name], MD;  [facility]: [facility];  Service: Cardiovascular;  Laterality: N/A;    Allergies  Allergies  Allergen Reactions   Ace Inhibitors Nausea Only    History of Present Illness    Dave [name] has a PMH of NSTEMI, HTN, palpitations, SOB, HLD, and is a renal transplant patient.  He was diagnosed with NSTEMI 04/13/22.  His echocardiogram showed normal LVEF at that time.  He underwent  cardiac catheterization 04/14/2022 which showed high-grade stenosis of his mid-distal ramus intermedius branch.  The branch was too small for PCI.  He had stenting of his LAD for moderate stenosis and abnormal FFR.  He was discharged on 04/15/2022.  He presented to the emergency department on 04/16/2022.  He complained of chest pain.  Cardiology was against consulted.  He reported compliance with his medication.  He indicated that he had been walking and felt fine.  He returned home and ate breakfast.  30 minutes after eating a experience palpitations, felt clammy, and noted similar type chest discomfort however, it was less intense than his NSTEMI pain.  He took sublingual nitroglycerin and called EMS.  He was advised to take ASA.  On arrival to the emergency department he was chest pain-free.  They remain pain-free and was found sleeping in bed when cardiology presented for evaluation.  He presented to the emergency department on 07/20/2022.  He reported chest tightness and shortness of breath that began on 07/20/2022.  He reported that he was in bed and denied exertional chest discomfort.  He took nitroglycerin and his pain went from 7 down to 4.  He took an additional nitroglycerin and he reported that shortly after arriving in the emergency department his chest tightness and shortness of breath resolved.  He reported compliance with his medications.  Cardiology follow-up was discussed.  He remained chest pain-free.  His blood pressure was 128/84 and his pulse was 65.  His high-sensitivity troponins were low and flat.  His CXR showed no acute abnormalities.  His underlying rhythm on telemetry was sinus rhythm.  He was instructed to follow-up with cardiology.  He presents to the clinic today for follow-up evaluation states he has had no further episodes of chest discomfort since his last emergency department visit.  He was not able to have his amlodipine filled.  His blood pressure today is 124/72.  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  He is walking in excess of 20,000 steps per day.  He has been strict with his diet staying away from sodium and eating food that he is making on his own.  We reviewed his angiography and echocardiogram.  He expressed understanding.  I will prescribe 2.5 of amlodipine, have him maintain his diet and exercise, and plan follow-up in 3 to 4 months.  We will repeat his fasting lipids and LFTs today.  Today he denies chest pain, shortness of breath, lower extremity edema.   Home Medications    Prior to Admission medications   Medication Sig Start Date End Date Taking? Authorizing Provider  amLODipine (NORVASC) 5 MG tablet Take 1 tablet (5 mg total) by mouth daily. 04/16/22   Cheryln Manly, NP  aspirin 81 MG tablet Take 81 mg by mouth daily.    [provider]  atorvastatin (LIPITOR) 80 MG tablet Take 1 tablet (80 mg total) by mouth daily. 04/16/22   Cheryln Manly, NP  Cholecalciferol (VITAMIN D) 1000 UNITS capsule Take 1,000 Units by mouth daily.    [provider]  metoprolol succinate (TOPROL-XL) 25 MG 24 hr tablet Take 0.5 tablets (12.5 mg total) by mouth daily. Take with or immediately following a meal. 04/28/22 04/23/23  Finis Bud, NP  nitroGLYCERIN (NITROSTAT) 0.4 MG SL tablet Place 1 tablet (0.4 mg total) under the tongue every 5 (five) minutes x 3 doses as needed for chest pain. 04/15/22   Cheryln Manly, NP  predniSONE (DELTASONE) 5 MG tablet Take 5 mg by mouth daily with breakfast.    [provider]  Tacrolimus (ENVARSUS XR PO) Take 5 tablets by mouth daily before breakfast.    [provider]  ticagrelor (BRILINTA) 90 MG TABS tablet Take 1 tablet (90 mg total) by mouth 2 (two) times daily. 04/15/22   Cheryln Manly, NP    Family History    Family History  Problem Relation Age of Onset   Hypertension Father    Hyperlipidemia Father    Heart disease Father        CABG   He indicated  that his mother is alive. He indicated that his father is alive. He indicated that his sister is alive.  Social History    Social History   Socioeconomic History   Marital status: Married    Spouse name: Not on file   Number of children: Not on file   Years of education: Not on file   Highest education level: Not on file  Occupational History   Not on file  Tobacco Use   Smoking status: Former    Packs/day: 0.50    Years: 7.00    Total pack years: 3.50    Types: Cigarettes    Quit date: 05/21/2009    Years since quitting: 13.3   Smokeless tobacco: Never  Substance and Sexual Activity   Alcohol use: No   Drug use: Not on file    Comment: Smoked marijuana for about 10-12  years; quit 2010.   Sexual activity: Not on file  Other Topics Concern   Not on file  Social History Narrative   Not on file   Social Determinants of Health   Financial Resource Strain: Not on file  Food Insecurity: Not on file  Transportation Needs: Not on file  Physical Activity: Not on file  Stress: Not on file  Social Connections: Not on file  Intimate Partner Violence: Not on file     Review of Systems    General:  No chills, fever, night sweats or weight changes.  Cardiovascular:  No chest pain, dyspnea on exertion, edema, orthopnea, palpitations, paroxysmal nocturnal dyspnea. Dermatological: No rash, lesions/masses Respiratory: No cough, dyspnea Urologic: No hematuria, dysuria Abdominal:   No nausea, vomiting, diarrhea, bright red blood per rectum, melena, or hematemesis Neurologic:  No visual changes, wkns, changes in mental status. All other systems reviewed and are otherwise negative except as noted above.  Physical Exam    VS:  BP 124/72 (BP Location: Left Arm, Patient Position: Sitting, Cuff Size: Large)   Pulse (!) 53   Ht 6\' 1"  (1.854 m)   Wt 209 lb (94.8 kg)   SpO2 98%   BMI 27.57 kg/m  , BMI Body mass index is 27.57 kg/m. GEN: Well nourished, well developed, in no acute  distress. HEENT: normal. Neck: Supple, no JVD, carotid bruits, or masses. Cardiac: RRR, no murmurs, rubs, or gallops. No clubbing, cyanosis, edema.  Radials/DP/PT 2+ and equal bilaterally.  Respiratory:  Respirations regular and unlabored, clear to auscultation bilaterally. GI: Soft, nontender, nondistended, BS + x 4. MS: no deformity or atrophy. Skin: warm and dry, no rash. Neuro:  Strength and sensation are intact. Psych: Normal affect.  Accessory Clinical Findings    Recent Labs: 07/20/2022: BUN 12; Creatinine, Ser 0.84; Hemoglobin 14.1; Platelets 247; Potassium 4.4; Sodium 140   Recent Lipid Panel    Component Value Date/Time   CHOL 188 04/15/2022 0207   CHOL 194 01/17/2015 0950   TRIG 91 04/15/2022 0207   TRIG 120 01/17/2015 0950   HDL 37 (L) 04/15/2022 0207   HDL 40 01/17/2015 0950   CHOLHDL 5.1 04/15/2022 0207   VLDL 18 04/15/2022 0207   VLDL 24 01/17/2015 0950   LDLCALC 133 (H) 04/15/2022 0207   LDLCALC 130 (H) 01/17/2015 0950         ECG personally reviewed by me today-none today.  Echocardiogram 04/15/2022  IMPRESSIONS     1. Left ventricular ejection fraction, by estimation, is 55%. The left  ventricle has normal function. The left ventricle has no regional wall  motion abnormalities. Left ventricular diastolic parameters were normal.   2. Right ventricular systolic function is normal. The right ventricular  size is normal. Tricuspid regurgitation signal is inadequate for assessing  PA pressure.   3. The mitral valve is normal in structure. Trivial mitral valve  regurgitation. No evidence of mitral stenosis.   4. The aortic valve is tricuspid. There is mild calcification of the  aortic valve. Aortic valve regurgitation is not visualized. No aortic  stenosis is present.   5. The inferior vena cava is normal in size with <50% respiratory  variability, suggesting right atrial pressure of 8 mmHg.   FINDINGS   Left Ventricle: Left ventricular ejection  fraction, by estimation, is  55%. The left ventricle has normal function. The left ventricle has no  regional wall motion abnormalities. The left ventricular internal cavity  size was normal in size. There is no  left ventricular hypertrophy. Left ventricular diastolic parameters were  normal.   Right Ventricle: The right ventricular size is normal. No increase in  right ventricular wall thickness. Right ventricular systolic function is  normal. Tricuspid regurgitation signal is inadequate for assessing PA  pressure.   Left Atrium: Left atrial size was normal in size.   Right Atrium: Right atrial size was normal in size.   Pericardium: There is no evidence of pericardial effusion.   Mitral Valve: The mitral valve is normal in structure. Trivial mitral  valve regurgitation. No evidence of mitral valve stenosis.   Tricuspid Valve: The tricuspid valve is normal in structure. Tricuspid  valve regurgitation is trivial.   Aortic Valve: The aortic valve is tricuspid. There is mild calcification  of the aortic valve. Aortic valve regurgitation is not visualized. No  aortic stenosis is present. Aortic valve peak gradient measures 7.4 mmHg.   Pulmonic Valve: The pulmonic valve was normal in structure. Pulmonic valve  regurgitation is not visualized.   Aorta: The aortic root is normal in size and structure.   Venous: The inferior vena cava is normal in size with less than 50%  respiratory variability, suggesting right atrial pressure of 8 mmHg.   IAS/Shunts: No atrial level shunt detected by color flow Doppler.     Cardiac catheterization 04/14/2022     Prox RCA lesion is 30% stenosed.   Dist RCA lesion is 60% stenosed.   Ramus lesion is 95% stenosed.   Mid LAD-1 lesion is 75% stenosed.   Mid LAD-2 lesion is 30% stenosed.   Dist LAD lesion is 20% stenosed.   Prox LAD lesion is 30% stenosed.   A drug-eluting stent was successfully placed using a SYNERGY XD 3.50X16.   Post  intervention, there is a 0% residual stenosis.   1.  Significant underlying two-vessel coronary artery disease.  The culprit for non-ST elevation myocardial infarction seems to be severe stenosis of the distal ramus.  The vessel is small in diameter in that area and tortuous distally.  Borderline mid LAD stenosis was significant by flow reserve evaluation with an RFR ratio of 0.88.  Moderate distal RCA stenosis. 2.  Left ventricular angiography was not performed to minimize contrast use.  We will obtain an echocardiogram.  Normal left ventricular end-diastolic pressure. 3.  Successful drug-eluting stent placement to the mid LAD.   Recommendations: Dual antiplatelet therapy for at least 1 year. Aggressive treatment of risk factors. The distal ramus is too small to stent.   Diagnostic Dominance: Right  Intervention      Assessment & Plan   1.  Chest discomfort, coronary artery disease-underwent cardiac catheterization and was noted to have two-vessel coronary disease.  His ramus intermediate was not amenable to PCI however, he received PCI and DES to his LAD 04/14/2022. Continue amlodipine, atorvastatin, metoprolol, aspirin, Brilinta Heart healthy low-sodium diet-salty 6 given Increase physical activity as tolerated  Hyperlipidemia-LDL 133 on 04/15/22 Continue atorvastatin, aspirin Heart healthy low-sodium high fiber diet Increase physical activity as tolerated Repeat fasting lipids and lfts  HTN-BP today 124/72.  Well-controlled at home. Continue current medical therapy  Shortness of breath-denies increased DOE or activity intolerance. Increase physical activity as tolerated  History of renal transplant-creatinine 0.84 Follows with nephrology  Disposition: Follow-up with Dr. Martinique in 3-4 months.   Jossie Ng. Tyrice Hewitt NP-C     09/07/2022, 10:29 AM [place] [address] Office [phone] Fax [phone]    I spent 14  minutes examining this patient, reviewing medications, and using patient centered shared decision making involving her cardiac care.  Prior to her visit I spent greater than 20 minutes reviewing her past medical history,  medications, and prior cardiac tests.

## 2022-09-07 ENCOUNTER — Ambulatory Visit: Payer: PRIVATE HEALTH INSURANCE | Attending: General Practice | Admitting: General Practice

## 2022-09-07 ENCOUNTER — Encounter: Payer: Self-pay | Admitting: General Practice

## 2022-09-07 VITALS — BP 124/72 | HR 53 | Ht 73.0 in | Wt 209.0 lb

## 2022-09-07 DIAGNOSIS — I25810 Atherosclerosis of coronary artery bypass graft(s) without angina pectoris: Secondary | ICD-10-CM

## 2022-09-07 DIAGNOSIS — E785 Hyperlipidemia, unspecified: Secondary | ICD-10-CM | POA: Diagnosis not present

## 2022-09-07 DIAGNOSIS — I10 Essential (primary) hypertension: Secondary | ICD-10-CM | POA: Diagnosis not present

## 2022-09-07 DIAGNOSIS — R079 Chest pain, unspecified: Secondary | ICD-10-CM | POA: Diagnosis not present

## 2022-09-07 DIAGNOSIS — R0602 Shortness of breath: Secondary | ICD-10-CM

## 2022-09-07 DIAGNOSIS — Z940 Kidney transplant status: Secondary | ICD-10-CM

## 2022-09-07 LAB — LIPID PANEL
Chol/HDL Ratio: 2.9 ratio (ref 0.0–5.0)
Cholesterol, Total: 147 mg/dL (ref 100–199)
HDL: 50 mg/dL (ref >39)
LDL Chol Calc (NIH): 85 mg/dL (ref 0–99)
Triglycerides: 58 mg/dL (ref 0–149)
VLDL Cholesterol Cal: 12 mg/dL (ref 5–40)

## 2022-09-07 LAB — HEPATIC FUNCTION PANEL
ALT: 24 IU/L (ref 0–44)
AST: 17 IU/L (ref 0–40)
Albumin: 4.8 g/dL (ref 4.1–5.1)
Alkaline Phosphatase: 97 IU/L (ref 44–121)
Bilirubin Total: 0.7 mg/dL (ref 0.0–1.2)
Bilirubin, Direct: 0.2 mg/dL (ref 0.00–0.40)
Total Protein: 6.9 g/dL (ref 6.0–8.5)

## 2022-09-07 MED ORDER — AMLODIPINE BESYLATE 2.5 MG PO TABS: 2.5000 mg | ORAL_TABLET | Freq: Every day | ORAL | 6 refills | Status: DC

## 2022-09-07 NOTE — Patient Instructions (Signed)
Medication Instructions:  DECREASE AMLODIPINE 2.5MG  DAILY *If you need a refill on your cardiac medications before your next appointment, please call your pharmacy*  Lab Work: LIPID AND LFT TODAY If you have labs (blood work) drawn today and your tests are completely normal, you will receive your results only by:  MyChart Message (if you have MyChart) OR A paper copy in the mail  If you have any lab test that is abnormal or we need to change your treatment, we will call you to review the results.  Testing/Procedures: NONE  Follow-Up: At [facility], you and your health needs are our priority.  As part of our continuing mission to provide you with exceptional heart care, we have created designated Provider Care Teams.  These Care Teams include your primary Cardiologist (physician) and Advanced Practice Providers (APPs -  Physician Assistants and Nurse Practitioners) who all work together to provide you with the care you need, when you need it.  We recommend signing up for the patient portal called "MyChart".  Sign up information is provided on this After Visit Summary.  MyChart is used to connect with patients for Virtual Visits (Telemedicine).  Patients are able to view lab/test results, encounter notes, upcoming appointments, etc.  Non-urgent messages can be sent to your provider as well.   To learn more about what you can do with MyChart, go to [email or web address].    Your next appointment:   3-4 month(s)  The format for your next appointment:   In Person  Provider:   Peter Swaziland, MD    Other Instructions PLEASE READ AND FOLLOW ATTACHED  SALTY 6    Important Information About Sugar

## 2022-09-07 NOTE — Unmapped (Signed)
09/07/22  [facility] office note.

## 2022-09-08 ENCOUNTER — Other Ambulatory Visit: Payer: Self-pay

## 2022-09-08 DIAGNOSIS — E785 Hyperlipidemia, unspecified: Secondary | ICD-10-CM

## 2022-09-08 DIAGNOSIS — I25810 Atherosclerosis of coronary artery bypass graft(s) without angina pectoris: Secondary | ICD-10-CM

## 2022-09-08 MED ORDER — EZETIMIBE 10 MG PO TABS: 10.0000 mg | ORAL_TABLET | Freq: Every day | ORAL | 3 refills | Status: DC

## 2022-09-21 NOTE — Unmapped (Signed)
[facility] Refill Coordination Note    Specialty Medication(s) to be Shipped:   Transplant: Prednisone 5mg     Other medication(s) to be shipped: No additional medications requested for fill at this time     Johnny Evans, DOB: 1975-10-01  Phone: [phone] (home)       All above HIPAA information was verified with patient.     Was a Nurse, learning disability used for this call? No    Completed refill call assessment today to schedule patient's medication shipment from the [facility] [phone]).  All relevant notes have been reviewed.     Specialty medication(s) and dose(s) confirmed: Regimen is correct and unchanged.   Changes to medications: Johnny Evans reports no changes at this time.  Changes to insurance: No  New side effects reported not previously addressed with a pharmacist or physician: None reported  Questions for the pharmacist: No    Confirmed patient received a Conservation officer, historic buildings and a Surveyor, mining with first shipment. The patient will receive a drug information handout for each medication shipped and additional FDA Medication Guides as required.       DISEASE/MEDICATION-SPECIFIC INFORMATION        N/A    SPECIALTY MEDICATION ADHERENCE     Medication Adherence    Patient reported X missed doses in the last month: 0  Specialty Medication: predniSONE 5 MG tablet (DELTASONE)  Patient is on additional specialty medications: No  Patient is on more than two specialty medications: No      Adherence tools used: patient uses a pill box to manage medications                          Were doses missed due to medication being on hold? No    predniSONE 5  mg: 10 days of medicine on hand       REFERRAL TO PHARMACIST     Referral to the pharmacist: Not needed      [facility]     Shipping address confirmed in Epic.     Delivery Scheduled: Yes, Expected medication delivery date: 09/30/22.     Medication will be delivered via UPS to the prescription address in Epic WAM.    Johnny Evans   [facility] Specialty Technician

## 2022-09-23 ENCOUNTER — Encounter (HOSPITAL_COMMUNITY): Payer: Self-pay

## 2022-09-23 ENCOUNTER — Emergency Department (HOSPITAL_COMMUNITY)
Admission: EM | Admit: 2022-09-23 | Discharge: 2022-09-23 | Discharge status: Against Medical Advice | Payer: PRIVATE HEALTH INSURANCE | Attending: Emergency Medicine | Admitting: Emergency Medicine

## 2022-09-23 ENCOUNTER — Other Ambulatory Visit: Payer: Self-pay

## 2022-09-23 ENCOUNTER — Emergency Department (HOSPITAL_COMMUNITY): Payer: PRIVATE HEALTH INSURANCE

## 2022-09-23 DIAGNOSIS — R0602 Shortness of breath: Secondary | ICD-10-CM | POA: Insufficient documentation

## 2022-09-23 DIAGNOSIS — R0789 Other chest pain: Secondary | ICD-10-CM | POA: Insufficient documentation

## 2022-09-23 DIAGNOSIS — R200 Anesthesia of skin: Secondary | ICD-10-CM | POA: Insufficient documentation

## 2022-09-23 DIAGNOSIS — R002 Palpitations: Secondary | ICD-10-CM | POA: Insufficient documentation

## 2022-09-23 DIAGNOSIS — Z7982 Long term (current) use of aspirin: Secondary | ICD-10-CM | POA: Insufficient documentation

## 2022-09-23 LAB — BASIC METABOLIC PANEL
Anion gap: 9 (ref 5–15)
BUN: 13 mg/dL (ref 6–20)
CO2: 27 mmol/L (ref 22–32)
Calcium: 9.5 mg/dL (ref 8.9–10.3)
Chloride: 103 mmol/L (ref 98–111)
Creatinine, Ser: 0.97 mg/dL (ref 0.61–1.24)
GFR, Estimated: >60 mL/min (ref >60)
Glucose, Bld: 103 mg/dL — ABNORMAL HIGH (ref 70–99)
Potassium: 4.5 mmol/L (ref 3.5–5.1)
Sodium: 139 mmol/L (ref 135–145)

## 2022-09-23 LAB — CBC
HCT: 43.8 % (ref 39.0–52.0)
Hemoglobin: 14.8 g/dL (ref 13.0–17.0)
MCH: 30.8 pg (ref 26.0–34.0)
MCHC: 33.8 g/dL (ref 30.0–36.0)
MCV: 91.1 fL (ref 80.0–100.0)
Platelets: 245 10*3/uL (ref 150–400)
RBC: 4.81 MIL/uL (ref 4.22–5.81)
RDW: 13.4 % (ref 11.5–15.5)
WBC: 9.3 10*3/uL (ref 4.0–10.5)
nRBC: 0 % (ref 0.0–0.2)

## 2022-09-23 LAB — TROPONIN I (HIGH SENSITIVITY): Troponin I (High Sensitivity): 3 ng/L (ref <18)

## 2022-09-23 NOTE — ED Provider Triage Note (Signed)
Emergency Medicine Provider Triage Evaluation Note  Dave [name] , a 47 y.o. male  was evaluated in triage.  Pt complains of intermittent palpitations, L arm numbness, chest pressure, and SOB for the last few months since stent placed in August. No N/V. Has taken nitroglycerin and 3 baby aspirin today w/relief.   Cardiologist: Dr. Martinique  Review of Systems  Positive: Palpitations, SOB Negative: N/V  Physical Exam  BP 106/64 (BP Location: Right Arm)   Pulse (!) 57   Temp 98.4 F (36.9 C) (Oral)   Resp 16   SpO2 96%  Gen:   Awake, no distress   Resp:  Normal effort  MSK:   Moves extremities without difficulty   Medical Decision Making  Medically screening exam initiated at 10:29 AM.  Appropriate orders placed.  Dave [name] was informed that the remainder of the evaluation will be completed by another provider, this initial triage assessment does not replace that evaluation, and the importance of remaining in the ED until their evaluation is complete.    Dave [name], [place] 09/23/22 1031

## 2022-09-23 NOTE — ED Triage Notes (Addendum)
EMS stated, chest pain at 845 and took ASA and one Nitro 2 months ago. . The pain was 5/10 after meds 0/10.  20g IV in left hand

## 2022-09-23 NOTE — ED Notes (Signed)
Patient came to this sort tech and explained he was not going to stay, as he "couldn't wait this long for treatment." IV was removed by this tech, pt tolerated well. LWBS.

## 2022-09-29 MED FILL — PREDNISONE 5 MG TABLET: ORAL | 30 days supply | Qty: 30 | Fill #9

## 2022-09-29 NOTE — Unmapped (Signed)
Called and sent text for patient to call to schedule follow-up with Dr. Toni Arthurs

## 2022-11-02 NOTE — Unmapped (Signed)
[facility] Refill Coordination Note    Specialty Medication(s) to be Shipped:   Transplant: Prednisone 5mg     Other medication(s) to be shipped: No additional medications requested for fill at this time     Johnny Evans, DOB: 01-27-1976  Phone: [phone] (home)       All above HIPAA information was verified with patient.     Was a Nurse, learning disability used for this call? No    Completed refill call assessment today to schedule patient's medication shipment from the [facility] [phone]).  All relevant notes have been reviewed.     Specialty medication(s) and dose(s) confirmed: Regimen is correct and unchanged.   Changes to medications: Johnny Evans reports no changes at this time.  Changes to insurance: No  New side effects reported not previously addressed with a pharmacist or physician: None reported  Questions for the pharmacist: No    Confirmed patient received a Conservation officer, historic buildings and a Surveyor, mining with first shipment. The patient will receive a drug information handout for each medication shipped and additional FDA Medication Guides as required.       DISEASE/MEDICATION-SPECIFIC INFORMATION        N/A    SPECIALTY MEDICATION ADHERENCE     Medication Adherence    Patient reported X missed doses in the last month: 0  Specialty Medication: predniSONE 5 MG tablet (DELTASONE)  Patient is on additional specialty medications: No  Adherence tools used: patient uses a pill box to manage medications              Were doses missed due to medication being on hold? No    Prednisone 5mg   : 5 days of medicine on hand     REFERRAL TO PHARMACIST     Referral to the pharmacist: Not needed      [facility]     Shipping address confirmed in Epic.     Patient was notified of new phone menu : Yes: changes coming    Delivery Scheduled: Yes, Expected medication delivery date: 11/04/2022.     Medication will be delivered via UPS to the temporary address in Epic WAM.  Verified TEMPORARY address for this delivery    Thad Ranger, PharmD   [facility] Specialty Pharmacist

## 2022-11-03 MED FILL — PREDNISONE 5 MG TABLET: ORAL | 30 days supply | Qty: 30 | Fill #10

## 2022-11-10 ENCOUNTER — Other Ambulatory Visit: Payer: Self-pay | Admitting: Cardiology

## 2022-12-08 NOTE — Unmapped (Signed)
The [facility] has made a third and final attempt to reach this patient to refill the following medication:prednisone.      We have left voicemails on the following phone numbers: [phone], have sent a MyChart message, and have sent a text message to the following phone numbers: [phone] .    Dates contacted: 3/4, 3/12, 3/20  Last scheduled delivery: 11/03/22    The patient may be at risk of non-compliance with this medication. The patient should call the [facility] at [phone]  Option 4, then Option 2 (all other specialty patients) to refill medication.    Tera Helper, [facility]   [facility] Specialty Pharmacist

## 2022-12-13 NOTE — Unmapped (Signed)
[facility] Refill Coordination Note    Specialty Medication(s) to be Shipped:   No specialty medication from [facility]    Other medication(s) to be shipped: prednisone      Johnny Evans, DOB: 03-05-1976  Phone: [phone] (home)       All above HIPAA information was verified with patient.     Was a Nurse, learning disability used for this call? No    Completed refill call assessment today to schedule patient's medication shipment from the [facility] [phone]).  All relevant notes have been reviewed.     Specialty medication(s) and dose(s) confirmed: Regimen is correct and unchanged.   Changes to medications: Johnny Evans reports no changes at this time.  Changes to insurance: No  New side effects reported not previously addressed with a pharmacist or physician: None reported  Questions for the pharmacist: No    Confirmed patient received a Conservation officer, historic buildings and a Surveyor, mining with first shipment. The patient will receive a drug information handout for each medication shipped and additional FDA Medication Guides as required.       DISEASE/MEDICATION-SPECIFIC INFORMATION        N/A    SPECIALTY MEDICATION ADHERENCE     Medication Adherence    Adherence tools used: patient uses a pill box to manage medications              Were doses missed due to medication being on hold? No    Prednisone 5mg   : 10 days of medicine on hand       REFERRAL TO PHARMACIST     Referral to the pharmacist: Not needed      [facility]     Shipping address confirmed in Epic.     Patient was notified of new phone menu : No    Delivery Scheduled: Yes, Expected medication delivery date: 3/28.     Medication will be delivered via UPS to the prescription address in Epic WAM.    Johnny Evans   [facility] Specialty Technician

## 2022-12-15 MED FILL — PREDNISONE 5 MG TABLET: ORAL | 30 days supply | Qty: 30 | Fill #11

## 2022-12-25 NOTE — Progress Notes (Unsigned)
Cardiology Office Note:    Date:  12/25/2022   ID:  Dave [name] [birth year], MRN [id number]  PCP:  [name], MD   [facility] Providers Cardiologist:  [name], MD { Click to update primary MD,subspecialty MD or APP then REFRESH:1}    Referring MD: [name], MD   No chief complaint on file. ***  History of Present Illness:    Dave [name] is a 47 y.o. male with a hx of NSTEMI, HTN, palpitations, SOB, HLD, and is a renal transplant patient.   He was diagnosed with NSTEMI 04/13/22.  His echocardiogram showed normal LVEF at that time.  He underwent cardiac catheterization 04/14/2022 which showed high-grade stenosis of his mid-distal ramus intermedius branch.  The branch was too small for PCI.  He had stenting of his LAD for moderate stenosis and abnormal FFR.  He was discharged on 04/15/2022.   He presented to the emergency department on 04/16/2022.  He complained of chest pain.  Cardiology was against consulted.  ED evaluation was unremarkable and pain resolved.    He presented to the emergency department on 07/20/2022.  He reported chest tightness and shortness of breath that began on 07/20/2022.  He reported that he was in bed and denied exertional chest discomfort.  He took nitroglycerin and his pain went from 7 down to 4.  He took an additional nitroglycerin and he reported that shortly after arriving in the emergency department his chest tightness and shortness of breath resolved.  He reported compliance with his medications.  He remained chest pain-free.  His blood pressure was 128/84 and his pulse was 65.  His high-sensitivity troponins were low and flat.  His CXR showed no acute abnormalities.  His underlying rhythm on telemetry was sinus rhythm.  On follow up in the office he had no recurrent chest pain.      Past Medical History:  Diagnosis Date   Asthma    Atrial fibrillation (HCC)    uses ASA and fish oil    Depression     GERD (gastroesophageal reflux disease)    Hx of gout    Hyperlipidemia    Hypertension    Kidney failure    NSTEMI (non-ST elevated myocardial infarction) (HCC) 04/14/2022   Renal insufficiency     Past Surgical History:  Procedure Laterality Date   CORONARY PRESSURE/FFR STUDY N/A 04/14/2022   Procedure: INTRAVASCULAR PRESSURE WIRE/FFR STUDY;  Surgeon: [name], MD;  Location: [facility] INVASIVE CV LAB;  Service: Cardiovascular;  Laterality: N/A;   CORONARY STENT INTERVENTION N/A 04/14/2022   Procedure: CORONARY STENT INTERVENTION;  Surgeon: [name], MD;  Location: [facility] INVASIVE CV LAB;  Service: Cardiovascular;  Laterality: N/A;   CYST EXCISION Left 06/11/2016   Procedure: CYST REMOVAL;  Surgeon: [name], MD;  Location: [facility];  Service: ENT;  Laterality: Left;  Left upper forehead   DIALYSIS FISTULA CREATION  2010   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   LEFT HEART CATH AND CORONARY ANGIOGRAPHY N/A 04/14/2022   Procedure: LEFT HEART CATH AND CORONARY ANGIOGRAPHY;  Surgeon: [name], MD;  Location: [facility] INVASIVE CV LAB;  Service: Cardiovascular;  Laterality: N/A;    Current Medications: No outpatient medications have been marked as taking for the 12/29/22 encounter (Appointment) with [place], [name], MD.     Allergies:   Ace inhibitors   Social History   Socioeconomic History   Marital status: Married    Spouse  name: Not on file   Number of children: Not on file   Years of education: Not on file   Highest education level: Not on file  Occupational History   Not on file  Tobacco Use   Smoking status: Former    Packs/day: 0.50    Years: 7.00    Additional pack years: 0.00    Total pack years: 3.50    Types: Cigarettes    Quit date: 05/21/2009    Years since quitting: 13.6   Smokeless tobacco: Never  Substance and Sexual Activity   Alcohol use: No   Drug use: Not Currently    Types: Marijuana    Comment: Smoked marijuana for about 10-12  years; quit 2010.   Sexual activity: Not on file  Other Topics Concern   Not on file  Social History Narrative   Not on file   Social Determinants of Health   Financial Resource Strain: Not on file  Food Insecurity: Not on file  Transportation Needs: Not on file  Physical Activity: Not on file  Stress: Not on file  Social Connections: Not on file     Family History: The patient's ***family history includes Heart disease in his father; Hyperlipidemia in his father; Hypertension in his father.  ROS:   Please see the history of present illness.    *** All other systems reviewed and are negative.  EKGs/Labs/Other Studies Reviewed:    The following studies were reviewed today: Echocardiogram 04/15/2022   IMPRESSIONS     1. Left ventricular ejection fraction, by estimation, is 55%. The left  ventricle has normal function. The left ventricle has no regional wall  motion abnormalities. Left ventricular diastolic parameters were normal.   2. Right ventricular systolic function is normal. The right ventricular  size is normal. Tricuspid regurgitation signal is inadequate for assessing  PA pressure.   3. The mitral valve is normal in structure. Trivial mitral valve  regurgitation. No evidence of mitral stenosis.   4. The aortic valve is tricuspid. There is mild calcification of the  aortic valve. Aortic valve regurgitation is not visualized. No aortic  stenosis is present.   5. The inferior vena cava is normal in size with <50% respiratory  variability, suggesting right atrial pressure of 8 mmHg.   FINDINGS   Left Ventricle: Left ventricular ejection fraction, by estimation, is  55%. The left ventricle has normal function. The left ventricle has no  regional wall motion abnormalities. The left ventricular internal cavity  size was normal in size. There is no  left ventricular hypertrophy. Left ventricular diastolic parameters were  normal.   Right Ventricle: The right  ventricular size is normal. No increase in  right ventricular wall thickness. Right ventricular systolic function is  normal. Tricuspid regurgitation signal is inadequate for assessing PA  pressure.   Left Atrium: Left atrial size was normal in size.   Right Atrium: Right atrial size was normal in size.   Pericardium: There is no evidence of pericardial effusion.   Mitral Valve: The mitral valve is normal in structure. Trivial mitral  valve regurgitation. No evidence of mitral valve stenosis.   Tricuspid Valve: The tricuspid valve is normal in structure. Tricuspid  valve regurgitation is trivial.   Aortic Valve: The aortic valve is tricuspid. There is mild calcification  of the aortic valve. Aortic valve regurgitation is not visualized. No  aortic stenosis is present. Aortic valve peak gradient measures 7.4 mmHg.   Pulmonic Valve: The pulmonic valve was  normal in structure. Pulmonic valve  regurgitation is not visualized.   Aorta: The aortic root is normal in size and structure.   Venous: The inferior vena cava is normal in size with less than 50%  respiratory variability, suggesting right atrial pressure of 8 mmHg.   IAS/Shunts: No atrial level shunt detected by color flow Doppler.        Cardiac catheterization 04/14/2022       Prox RCA lesion is 30% stenosed.   Dist RCA lesion is 60% stenosed.   Ramus lesion is 95% stenosed.   Mid LAD-1 lesion is 75% stenosed.   Mid LAD-2 lesion is 30% stenosed.   Dist LAD lesion is 20% stenosed.   Prox LAD lesion is 30% stenosed.   A drug-eluting stent was successfully placed using a SYNERGY XD [id number].   Post intervention, there is a 0% residual stenosis.   1.  Significant underlying two-vessel coronary artery disease.  The culprit for non-ST elevation myocardial infarction seems to be severe stenosis of the distal ramus.  The vessel is small in diameter in that area and tortuous distally.  Borderline mid LAD stenosis was significant  by flow reserve evaluation with an RFR ratio of 0.88.  Moderate distal RCA stenosis. 2.  Left ventricular angiography was not performed to minimize contrast use.  We will obtain an echocardiogram.  Normal left ventricular end-diastolic pressure. 3.  Successful drug-eluting stent placement to the mid LAD.   Recommendations: Dual antiplatelet therapy for at least 1 year. Aggressive treatment of risk factors. The distal ramus is too small to stent.   Diagnostic Dominance: Right  Intervention       EKG:  EKG is *** ordered today.  The ekg ordered today demonstrates ***  Recent Labs: 09/07/2022: ALT 24 09/23/2022: BUN 13; Creatinine, Ser 0.97; Hemoglobin 14.8; Platelets 245; Potassium 4.5; Sodium 139  Recent Lipid Panel    Component Value Date/Time   CHOL 147 09/07/2022 1047   CHOL 194 01/17/2015 0950   TRIG 58 09/07/2022 1047   TRIG 120 01/17/2015 0950   HDL 50 09/07/2022 1047   HDL 40 01/17/2015 0950   CHOLHDL 2.9 09/07/2022 1047   CHOLHDL 5.1 04/15/2022 0207   VLDL 18 04/15/2022 0207   VLDL 24 01/17/2015 0950   LDLCALC 85 09/07/2022 1047   LDLCALC 130 (H) 01/17/2015 0950     Risk Assessment/Calculations:   {Does this patient have ATRIAL FIBRILLATION?:[phone]}  No BP recorded.  {Refresh Note OR Click here to enter BP  :1}***         Physical Exam:    VS:  There were no vitals taken for this visit.    Wt Readings from Last 3 Encounters:  09/23/22 208 lb 15.9 oz (94.8 kg)  09/07/22 209 lb (94.8 kg)  07/20/22 220 lb (99.8 kg)     GEN: *** Well nourished, well developed in no acute distress HEENT: Normal NECK: No JVD; No carotid bruits LYMPHATICS: No lymphadenopathy CARDIAC: ***RRR, no murmurs, rubs, gallops RESPIRATORY:  Clear to auscultation without rales, wheezing or rhonchi  ABDOMEN: Soft, non-tender, non-distended MUSCULOSKELETAL:  No edema; No deformity  SKIN: Warm and dry NEUROLOGIC:  Alert and oriented x 3 PSYCHIATRIC:  Normal affect    ASSESSMENT:    No diagnosis found. PLAN:    In order of problems listed above:  CAD HTN HLD S/p renal transplant. Followed by Nephrology      {Are you ordering a CV Procedure (e.g. stress test, cath, DCCV, TEE, etc)?  Press F2        :[id number]}    Medication Adjustments/Labs and Tests Ordered: Current medicines are reviewed at length with the patient today.  Concerns regarding medicines are outlined above.  No orders of the defined types were placed in this encounter.  No orders of the defined types were placed in this encounter.   There are no Patient Instructions on file for this visit.   Signed, Kayin Kettering Sw[place]iland, MD  12/25/2022 7:47 AM    [facility]

## 2022-12-27 DIAGNOSIS — Z940 Kidney transplant status: Principal | ICD-10-CM

## 2022-12-27 DIAGNOSIS — R7989 Other specified abnormal findings of blood chemistry: Principal | ICD-10-CM

## 2022-12-27 DIAGNOSIS — R82998 Other abnormal findings in urine: Principal | ICD-10-CM

## 2022-12-29 ENCOUNTER — Ambulatory Visit: Admit: 2022-12-29 | Discharge: 2022-12-30

## 2022-12-29 ENCOUNTER — Ambulatory Visit: Admit: 2022-12-29 | Discharge: 2022-12-30 | Attending: Nephrology | Primary: Nephrology

## 2022-12-29 ENCOUNTER — Encounter: Payer: Self-pay | Admitting: Cardiology

## 2022-12-29 ENCOUNTER — Ambulatory Visit: Payer: Self-pay | Attending: Cardiology | Admitting: Cardiology

## 2022-12-29 VITALS — BP 112/78 | HR 55 | Ht 73.0 in | Wt 214.6 lb

## 2022-12-29 DIAGNOSIS — Z940 Kidney transplant status: Principal | ICD-10-CM

## 2022-12-29 DIAGNOSIS — R7989 Other specified abnormal findings of blood chemistry: Principal | ICD-10-CM

## 2022-12-29 DIAGNOSIS — R82998 Other abnormal findings in urine: Principal | ICD-10-CM

## 2022-12-29 DIAGNOSIS — I10 Essential (primary) hypertension: Secondary | ICD-10-CM

## 2022-12-29 DIAGNOSIS — I25810 Atherosclerosis of coronary artery bypass graft(s) without angina pectoris: Secondary | ICD-10-CM

## 2022-12-29 DIAGNOSIS — E785 Hyperlipidemia, unspecified: Secondary | ICD-10-CM

## 2022-12-29 LAB — PROTEIN / CREATININE RATIO, URINE
CREATININE, URINE: 178.7 mg/dL
PROTEIN URINE: 12.7 mg/dL
PROTEIN/CREAT RATIO, URINE: 0.071

## 2022-12-29 LAB — ALBUMIN / CREATININE URINE RATIO
ALBUMIN QUANT URINE: 0.3 mg/dL
ALBUMIN/CREATININE RATIO: 1.7 ug/mg (ref 0.0–30.0)
CREATININE, URINE: 179.4 mg/dL

## 2022-12-29 LAB — COMPREHENSIVE METABOLIC PANEL
ALBUMIN: 4.2 g/dL (ref 3.4–5.0)
ALKALINE PHOSPHATASE: 93 U/L (ref 46–116)
ALT (SGPT): 60 U/L — ABNORMAL HIGH (ref 10–49)
ANION GAP: 4 mmol/L — ABNORMAL LOW (ref 5–14)
AST (SGOT): 24 U/L (ref <=34)
BILIRUBIN TOTAL: 0.8 mg/dL (ref 0.3–1.2)
BLOOD UREA NITROGEN: 14 mg/dL (ref 9–23)
BUN / CREAT RATIO: 16
CALCIUM: 9.8 mg/dL (ref 8.7–10.4)
CHLORIDE: 109 mmol/L — ABNORMAL HIGH (ref 98–107)
CO2: 27.5 mmol/L (ref 20.0–31.0)
CREATININE: 0.88 mg/dL
EGFR CKD-EPI (2021) MALE: >90 mL/min/{1.73_m2} (ref >=60)
GLUCOSE RANDOM: 109 mg/dL — ABNORMAL HIGH (ref 70–99)
POTASSIUM: 4.3 mmol/L (ref 3.4–4.8)
PROTEIN TOTAL: 7.2 g/dL (ref 5.7–8.2)
SODIUM: 140 mmol/L (ref 135–145)

## 2022-12-29 LAB — URINALYSIS WITH MICROSCOPY
BILIRUBIN UA: NEGATIVE
BLOOD UA: NEGATIVE
GLUCOSE UA: NEGATIVE
KETONES UA: NEGATIVE
LEUKOCYTE ESTERASE UA: NEGATIVE
NITRITE UA: NEGATIVE
PH UA: 6.5 (ref 5.0–9.0)
PROTEIN UA: NEGATIVE
RBC UA: <1 /HPF (ref <3)
SPECIFIC GRAVITY UA: >=1.03 (ref 1.005–1.030)
SQUAMOUS EPITHELIAL: <1 /HPF (ref 0–5)
UROBILINOGEN UA: 0.2
WBC UA: 3 /HPF — ABNORMAL HIGH (ref <2)

## 2022-12-29 LAB — CBC W/ AUTO DIFF
BASOPHILS ABSOLUTE COUNT: 0 10*9/L (ref 0.0–0.1)
BASOPHILS RELATIVE PERCENT: 0.5 %
EOSINOPHILS ABSOLUTE COUNT: 0.3 10*9/L (ref 0.0–0.5)
EOSINOPHILS RELATIVE PERCENT: 3 %
HEMATOCRIT: 43.8 % (ref 39.0–48.0)
HEMOGLOBIN: 14.6 g/dL (ref 12.9–16.5)
LYMPHOCYTES ABSOLUTE COUNT: 1.8 10*9/L (ref 1.1–3.6)
LYMPHOCYTES RELATIVE PERCENT: 20.3 %
MEAN CORPUSCULAR HEMOGLOBIN CONC: 33.4 g/dL (ref 32.0–36.0)
MEAN CORPUSCULAR HEMOGLOBIN: 29.8 pg (ref 25.9–32.4)
MEAN CORPUSCULAR VOLUME: 89.4 fL (ref 77.6–95.7)
MEAN PLATELET VOLUME: 7.9 fL (ref 6.8–10.7)
MONOCYTES ABSOLUTE COUNT: 0.7 10*9/L (ref 0.3–0.8)
MONOCYTES RELATIVE PERCENT: 7.3 %
NEUTROPHILS ABSOLUTE COUNT: 6.2 10*9/L (ref 1.8–7.8)
NEUTROPHILS RELATIVE PERCENT: 68.9 %
PLATELET COUNT: 252 10*9/L (ref 150–450)
RED BLOOD CELL COUNT: 4.89 10*12/L (ref 4.26–5.60)
RED CELL DISTRIBUTION WIDTH: 14.4 % (ref 12.2–15.2)
WBC ADJUSTED: 9 10*9/L (ref 3.6–11.2)

## 2022-12-29 LAB — LIPID PANEL
CHOLESTEROL/HDL RATIO SCREEN: 2.6 (ref 1.0–4.5)
CHOLESTEROL: 126 mg/dL (ref <=200)
HDL CHOLESTEROL: 49 mg/dL (ref 40–60)
LDL CHOLESTEROL CALCULATED: 68 mg/dL (ref 40–99)
NON-HDL CHOLESTEROL: 77 mg/dL (ref 70–130)
TRIGLYCERIDES: 47 mg/dL (ref 0–150)
VLDL CHOLESTEROL CAL: 9.4 mg/dL — ABNORMAL LOW (ref 11–50)

## 2022-12-29 LAB — CMV DNA, QUANTITATIVE, PCR: CMV VIRAL LD: NOT DETECTED

## 2022-12-29 LAB — PHOSPHORUS: PHOSPHORUS: 3.2 mg/dL (ref 2.4–5.1)

## 2022-12-29 LAB — HEMOGLOBIN A1C
ESTIMATED AVERAGE GLUCOSE: 126 mg/dL
HEMOGLOBIN A1C: 6 % — ABNORMAL HIGH (ref 4.8–5.6)

## 2022-12-29 LAB — BK VIRUS QUANTITATIVE PCR, BLOOD: BK BLOOD RESULT: NOT DETECTED

## 2022-12-29 LAB — BILIRUBIN, DIRECT: BILIRUBIN DIRECT: 0.3 mg/dL (ref 0.00–0.30)

## 2022-12-29 LAB — MAGNESIUM: MAGNESIUM: 1.6 mg/dL (ref 1.6–2.6)

## 2022-12-29 LAB — TACROLIMUS LEVEL, TROUGH: TACROLIMUS, TROUGH: 11 ng/mL (ref 5.0–15.0)

## 2022-12-29 NOTE — Patient Instructions (Signed)
Medication Instructions:  You can stop Brilinta 04/15/23 Continue all other medications *If you need a refill on your cardiac medications before your next appointment, please call your pharmacy*   Lab Work: Lipid and Liver panels today   Testing/Procedures: None ordered   Follow-Up: At [facility], you and your health needs are our priority.  As part of our continuing mission to provide you with exceptional heart care, we have created designated Provider Care Teams.  These Care Teams include your primary Cardiologist (physician) and Advanced Practice Providers (APPs -  Physician Assistants and Nurse Practitioners) who all work together to provide you with the care you need, when you need it.  We recommend signing up for the patient portal called "MyChart".  Sign up information is provided on this After Visit Summary.  MyChart is used to connect with patients for Virtual Visits (Telemedicine).  Patients are able to view lab/test results, encounter notes, upcoming appointments, etc.  Non-urgent messages can be sent to your provider as well.   To learn more about what you can do with MyChart, go to [email or web address].    Your next appointment:  6 months    Provider:  Dr.Jordan
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
## 2022-12-29 NOTE — Unmapped (Signed)
12/29/22 [facility], Heart care office visit note.

## 2022-12-30 ENCOUNTER — Encounter: Payer: Self-pay | Admitting: Cardiology

## 2022-12-30 LAB — LIPID PANEL
Chol/HDL Ratio: 2.5 ratio (ref 0.0–5.0)
Cholesterol, Total: 132 mg/dL (ref 100–199)
HDL: 52 mg/dL (ref >39)
LDL Chol Calc (NIH): 62 mg/dL (ref 0–99)
Triglycerides: 94 mg/dL (ref 0–149)
VLDL Cholesterol Cal: 18 mg/dL (ref 5–40)

## 2022-12-30 LAB — HEPATIC FUNCTION PANEL
ALT: 54 IU/L — ABNORMAL HIGH (ref 0–44)
AST: 25 IU/L (ref 0–40)
Albumin: 4.7 g/dL (ref 4.1–5.1)
Alkaline Phosphatase: 100 IU/L (ref 44–121)
Bilirubin Total: 0.6 mg/dL (ref 0.0–1.2)
Bilirubin, Direct: 0.16 mg/dL (ref 0.00–0.40)
Total Protein: 6.6 g/dL (ref 6.0–8.5)

## 2023-01-06 LAB — HLA DS POST TRANSPLANT
ANTI-DONOR DRW #1 MFI: 763 MFI
ANTI-DONOR DRW #2 MFI: 203 MFI
ANTI-DONOR HLA-A #1 MFI: 12 MFI
ANTI-DONOR HLA-A #2 MFI: 8 MFI
ANTI-DONOR HLA-B #1 MFI: 0 MFI
ANTI-DONOR HLA-B #2 MFI: 10 MFI
ANTI-DONOR HLA-C #1 MFI: 0 MFI
ANTI-DONOR HLA-C #2 MFI: 0 MFI
ANTI-DONOR HLA-DQB #1 MFI: 610 MFI
ANTI-DONOR HLA-DQB #2 MFI: 554 MFI
ANTI-DONOR HLA-DR #1 MFI: 381 MFI
ANTI-DONOR HLA-DR #2 MFI: 177 MFI

## 2023-01-06 LAB — FSAB CLASS 1 ANTIBODY SPECIFICITY: HLA CLASS 1 ANTIBODY RESULT: POSITIVE

## 2023-01-06 LAB — FSAB CLASS 2 ANTIBODY SPECIFICITY: HLA CL2 AB RESULT: NEGATIVE

## 2023-01-07 DIAGNOSIS — Z940 Kidney transplant status: Principal | ICD-10-CM

## 2023-01-07 DIAGNOSIS — D849 Immunodeficiency, unspecified: Principal | ICD-10-CM

## 2023-01-07 MED ORDER — PREDNISONE 5 MG TABLET
ORAL_TABLET | Freq: Every day | ORAL | 11 refills | 30 days | Status: CP
Start: 2023-01-07 — End: 2024-01-07
  Filled 2023-02-10: qty 30, 30d supply, fill #0

## 2023-01-07 MED ORDER — TACROLIMUS XR 1 MG TABLET,EXTENDED RELEASE 24 HR
ORAL_TABLET | Freq: Every day | ORAL | 11 refills | 30 days | Status: CP
Start: 2023-01-07 — End: ?

## 2023-01-07 NOTE — Unmapped (Signed)
[employer] [place] transplant nephrology clinic visit    assessment and plan:  1. kidney transplant 05/23/2014. baseline creatinine 1-1.2 mg/dl. no proteinuria. no donor specific hla ab detected.   2. immunosuppression. prednisone 5mg  daily. tacrolimus 24hr lvl 4-7 ng/ml.  3. hypertension. blood pressure goal < 130/80 mmhg  4. hyperlipidemia. atorvastatin 80mg  daily.  5. preventive medicine. influenza '20/annual update encouraged. pcv13 pneumococcal '14. ppsv23 pneumococcal '16. pfizer covid-19 vaccine '22. kidney ultrasound '20; annual follow up recommended.     history of present illness    mr. Johnny Evans is a 47 year old gentleman seen in follow up post kidney transplant 05/23/2014.     past medical hx:  1. 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 focal segmental glomerulosclerosis. alemtuzumab induction. baseline creatinine 1-1.2 mg/dl.  > kidney bx 12/15: +stage1 bk nephropathy. no acute/chr rejection.  2. hypertension  3. hyperlipidemia  4. echocardiogram '14: nl lv. lvef > 55%. diastolic lv dysfunction.  5. atrial fibrillation  6. gout  7. hx asthma/reactive airways disease    past surgical hx: left upper ext av fistula '10. kidney transplant '15.    allergies: ace inh    medications: tacrolimus 3mg  am/pm, prednisone 5mg  daily, sertraline 100mg  daily, aspirin 81mg  daily, omega3 fish oil 2g daily, vitamin d3 2,000u daily.    soc hx: married/separated x2 sons. former smoking hx; quit '12.    physical exam: t* p* bp* wt* bmi *. wd/wn gentleman appropriate affect and mood. sclera anicteric. mmm no thrush. neck supple no palpable ln. heart rrr nl s1s2 no m/r/g. lungs clear bilateral. abd soft nt/nd. no lower ext edema. msk no synovitis or tophi. skin no rash. neuro alert oriented non focal exam.

## 2023-01-07 NOTE — Unmapped (Signed)
The patient is requesting a medication refill

## 2023-01-07 NOTE — Unmapped (Signed)
The [facility] has received the prescription(s) for Envarsus XR 1mg . The triage team has completed the benefits investigation and has determined that the patient is NOT able to fill this medication at the [facility] due to insurance plan limitations. Please see additional information below and re-route the prescription to the preferred pharmacy. Thank you.    PA Required: Unable to Determine    Specialty Pharmacy Required:  [facility] - Phone:[phone] and Fax: [phone]

## 2023-01-08 ENCOUNTER — Other Ambulatory Visit: Payer: Self-pay | Admitting: General Practice

## 2023-01-10 DIAGNOSIS — Z940 Kidney transplant status: Principal | ICD-10-CM

## 2023-01-10 MED ORDER — TACROLIMUS XR 1 MG TABLET,EXTENDED RELEASE 24 HR
ORAL_TABLET | Freq: Every day | ORAL | 11 refills | 30 days | Status: CP
Start: 2023-01-10 — End: ?

## 2023-01-10 NOTE — Unmapped (Signed)
Envarsus MFR assistance expired.  Pt now has Nurse, learning disability and unable to fill through [facility].  Anticipate may need copay card.  Per pt request sent Rx to [facility].  If unable to fill there will send to [facility].    Pt has 2 weeks left of drug.

## 2023-01-11 DIAGNOSIS — Z940 Kidney transplant status: Principal | ICD-10-CM

## 2023-01-11 MED ORDER — TACROLIMUS XR 1 MG TABLET,EXTENDED RELEASE 24 HR
ORAL_TABLET | Freq: Every day | ORAL | 3 refills | 90 days | Status: CP
Start: 2023-01-11 — End: ?

## 2023-01-11 NOTE — Unmapped (Signed)
Spoke with pt. Let him know [facility] unable to fill Envarsus. Sent to [facility]

## 2023-01-17 NOTE — Unmapped (Signed)
Johnny Evans has been contacted in regards to their refill of prednisone 5 mg. At this time, they have declined refill due to patient having 14+ doses remaining. Refill assessment call date has been updated per the patient's request.

## 2023-02-04 NOTE — Unmapped (Signed)
[facility] Refill Coordination Note    Specialty Medication(s) to be Shipped:   Transplant: Prednisone 5mg     Other medication(s) to be shipped: No additional medications requested for fill at this time     Johnny Evans, DOB: [birth year]  Phone: [phone] (home)       All above HIPAA information was verified with patient.     Was a Nurse, learning disability used for this call? No    Completed refill call assessment today to schedule patient's medication shipment from the [facility] [phone]).  All relevant notes have been reviewed.     Specialty medication(s) and dose(s) confirmed: Regimen is correct and unchanged.   Changes to medications: Johnny Evans reports no changes at this time.  Changes to insurance: No  New side effects reported not previously addressed with a pharmacist or physician: None reported  Questions for the pharmacist: No    Confirmed patient received a Conservation officer, historic buildings and a Surveyor, mining with first shipment. The patient will receive a drug information handout for each medication shipped and additional FDA Medication Guides as required.       DISEASE/MEDICATION-SPECIFIC INFORMATION        N/A    SPECIALTY MEDICATION ADHERENCE     Medication Adherence    Patient reported X missed doses in the last month: 0  Specialty Medication: prednisone 5mg   Patient is on additional specialty medications: No  Adherence tools used: patient uses a pill box to manage medications              Were doses missed due to medication being on hold? No    Prednisone 5 mg: 14 days of medicine on hand     REFERRAL TO PHARMACIST     Referral to the pharmacist: Not needed      [facility]     Shipping address confirmed in Epic.       Delivery Scheduled: Yes, Expected medication delivery date: 02/11/23.     Medication will be delivered via UPS to the prescription address in Epic WAM.    Tera Helper, [facility]   [facility] Specialty Pharmacist

## 2023-02-20 ENCOUNTER — Other Ambulatory Visit: Payer: Self-pay | Admitting: Cardiology

## 2023-02-25 DIAGNOSIS — Z940 Kidney transplant status: Principal | ICD-10-CM

## 2023-02-25 MED ORDER — ENVARSUS XR 1 MG TABLET,EXTENDED RELEASE
ORAL_TABLET | 3 refills | 0 days | Status: CP
Start: 2023-02-25 — End: ?

## 2023-02-25 NOTE — Unmapped (Signed)
Pt request for RX Refill

## 2023-03-04 MED ORDER — SERTRALINE 100 MG TABLET
ORAL_TABLET | Freq: Every day | ORAL | 6 refills | 30 days | Status: CP
Start: 2023-03-04 — End: ?

## 2023-04-12 NOTE — Unmapped (Signed)
[facility] Refill Coordination Note    Specialty Medication(s) to be Shipped:   Transplant: Prednisone 5mg     Other medication(s) to be shipped: No additional medications requested for fill at this time     Johnny Evans, DOB: 09/22/1975  Phone: [phone] (home)       All above HIPAA information was verified with patient.     Was a Nurse, learning disability used for this call? No    Completed refill call assessment today to schedule patient's medication shipment from the [facility] [phone]).  All relevant notes have been reviewed.     Specialty medication(s) and dose(s) confirmed: Regimen is correct and unchanged.   Changes to medications: Ewart reports no changes at this time.  Changes to insurance: No  New side effects reported not previously addressed with a pharmacist or physician: None reported  Questions for the pharmacist: No    Confirmed patient received a Conservation officer, historic buildings and a Surveyor, mining with first shipment. The patient will receive a drug information handout for each medication shipped and additional FDA Medication Guides as required.       DISEASE/MEDICATION-SPECIFIC INFORMATION        N/A    SPECIALTY MEDICATION ADHERENCE     Medication Adherence    Patient reported X missed doses in the last month: 0  Specialty Medication: predniSONE 5 MG tablet (DELTASONE)  Patient is on additional specialty medications: No  Patient is on more than two specialty medications: No  Any gaps in refill history greater than 2 weeks in the last 3 months: no  Demonstrates understanding of importance of adherence: yes  Informant: patient  Adherence tools used: patient uses a pill box to manage medications  Confirmed plan for next specialty medication refill: delivery by pharmacy  Refills needed for supportive medications: not needed          Refill Coordination    Has the Patients' Contact Information Changed: No  Is the Shipping Address Different: No         Were doses missed due to medication being on hold? No    predniSONE 5   mg: 3 days of medicine on hand       REFERRAL TO PHARMACIST     Referral to the pharmacist: Not needed      [facility]     Shipping address confirmed in Epic.       Delivery Scheduled: Yes, Expected medication delivery date: 04/14/2023.     Medication will be delivered via UPS to the prescription address in Epic WAM.    Kerby Less   [facility] Specialty Technician

## 2023-04-13 MED FILL — PREDNISONE 5 MG TABLET: ORAL | 30 days supply | Qty: 30 | Fill #1

## 2023-04-17 ENCOUNTER — Other Ambulatory Visit: Payer: Self-pay | Admitting: General Practice

## 2023-04-26 NOTE — Unmapped (Signed)
[facility] Clinical Assessment & Refill Coordination Note    Johnny Evans, DOB: 1976/06/12  Phone: [phone] (home)     All above HIPAA information was verified with patient.     Was a Nurse, learning disability used for this call? No    Specialty Medication(s):   Transplant: Prednisone 5mg      Current Outpatient Medications   Medication Sig Dispense Refill    amLODIPine (NORVASC) 5 MG tablet Take 1 tablet (5 mg total) by mouth daily.      aspirin (ECOTRIN) 81 MG tablet Take 1 tablet (81 mg total) by mouth daily.      atorvastatin (LIPITOR) 80 MG tablet Take 1 tablet (80 mg total) by mouth daily.      cholecalciferol, vitamin D3, (VITAMIN D3) 25 mcg (1,000 unit) capsule Take 2 capsules (2,000 Units total) by mouth daily.      ENVARSUS XR 1 mg Tb24 extended release tablet Take 5 tablets by mouth daily. 450 tablet 3    nitroglycerin (NITROSTAT) 0.4 MG SL tablet Place 1 tablet (0.4 mg total) under the tongue every five (5) minutes as needed for chest pain. Maximum of 3 doses in 15 minutes.      OMEGA-3/DHA/EPA/FISH OIL (FISH OIL-OMEGA-3 FATTY ACIDS) 300-1,000 mg capsule Take 2 capsules (2 g total) by mouth daily. 90 capsule 0    predniSONE (DELTASONE) 5 MG tablet Take 1 tablet (5 mg total) by mouth daily. 30 tablet 11    sertraline (ZOLOFT) 100 MG tablet Take 1 tablet (100 mg total) by mouth daily. 30 tablet 6    ticagrelor (BRILINTA) 90 mg Tab Take 1 tablet (90 mg total) by mouth two (2) times a day.       No current facility-administered medications for this visit.        Changes to medications: Johnny Evans reports no changes at this time.    Allergies   Allergen Reactions    Ace Inhibitors      GI upset       Changes to allergies: No    SPECIALTY MEDICATION ADHERENCE     Prednisone 5 mg: 18 days of medicine on hand     Medication Adherence    Patient reported X missed doses in the last month: 0  Specialty Medication: Prednisone 5mg   Patient is on additional specialty medications: No  Adherence tools used: patient uses a pill box to manage medications          Specialty medication(s) dose(s) confirmed: Regimen is correct and unchanged.     Are there any concerns with adherence? No    Adherence counseling provided? Not needed    CLINICAL MANAGEMENT AND INTERVENTION      Clinical Benefit Assessment:    Do you feel the medicine is effective or helping your condition? Yes    Clinical Benefit counseling provided? Not needed    Adverse Effects Assessment:    Are you experiencing any side effects? No    Are you experiencing difficulty administering your medicine? No    Quality of Life Assessment:    Quality of Life    Rheumatology  Oncology  Dermatology  Cystic Fibrosis          How many days over the past month did your kidney transplant  keep you from your normal activities? For example, brushing your teeth or getting up in the morning. 0    Have you discussed this with your provider? Not needed    Acute Infection Status:  Acute infections noted within Epic:  No active infections  Patient reported infection: None    Therapy Appropriateness:    Is therapy appropriate based on current medication list, adverse reactions, adherence, clinical benefit and progress toward achieving therapeutic goals? Yes, therapy is appropriate and should be continued     DISEASE/MEDICATION-SPECIFIC INFORMATION      N/A    Solid Organ Transplant: Not Applicable    PATIENT SPECIFIC NEEDS     Does the patient have any physical, cognitive, or cultural barriers? No    Is the patient high risk? No    Did the patient require a clinical intervention? No    Does the patient require physician intervention or other additional services (i.e., nutrition, smoking cessation, social work)? No    SOCIAL DETERMINANTS OF HEALTH     At the [facility], we have learned that life circumstances - like trouble affording food, housing, utilities, or transportation can affect the health of many of our patients.   That is why we wanted to ask: are you currently experiencing any life circumstances that are negatively impacting your health and/or quality of life? Patient declined to answer    Social Determinants of Health     Financial Resource Strain: Not on file   Internet Connectivity: Not on file   Food Insecurity: Not on file   Tobacco Use: Medium Risk (12/29/2022)    Patient History     Smoking Tobacco Use: Former     Smokeless Tobacco Use: Never     Passive Exposure: Not on file   Housing/Utilities: Not on file   Alcohol Use: Not on file   Transportation Needs: Not on file   Substance Use: Not on file   Health Literacy: Not on file   Physical Activity: Not on file   Interpersonal Safety: Unknown (04/26/2023)    Interpersonal Safety     Unsafe Where You Currently Live: Not on file     Physically Hurt by Anyone: Not on file     Abused by Anyone: Not on file   Stress: Not on file   Intimate Partner Violence: Unknown (12/24/2021)    Received from [facility]    HITS     Physically Hurt: Not on file     Insult or Talk Down To: Not on file     Threaten Physical Harm: Not on file     Scream or Curse: Not on file   Depression: Not on file   Social Connections: Unknown (02/01/2022)    Received from [employer]    Social Network     Social Network: Not on file       Would you be willing to receive help with any of the needs that you have identified today? Not applicable       SHIPPING     Specialty Medication(s) to be Shipped:   Transplant: Patient declined prednisone today.    Other medication(s) to be shipped: No additional medications requested for fill at this time     Changes to insurance: No    Delivery Scheduled: Patient declined refill at this time due to has more than 2 weeks on hand..     Medication will be delivered via UPS to the confirmed prescription address in [facility].    The patient will receive a drug information handout for each medication shipped and additional FDA Medication Guides as required.  Verified that patient has previously received a Conservation officer, historic buildings and a Surveyor, mining.  The patient or caregiver noted above participated in the development of this care plan and knows that they can request review of or adjustments to the care plan at any time.      All of the patient's questions and concerns have been addressed.    Tera Helper, [facility]   [name] Specialty Pharmacist

## 2023-05-09 NOTE — Unmapped (Signed)
[facility] Refill Coordination Note    Specialty Medication(s) to be Shipped:   Transplant: Prednisone 5mg     Other medication(s) to be shipped: No additional medications requested for fill at this time     Johnny Evans, DOB: Jul 26, 1976  Phone: [phone] (home)       All above HIPAA information was verified with patient.     Was a Nurse, learning disability used for this call? No    Completed refill call assessment today to schedule patient's medication shipment from the [facility] [phone]).  All relevant notes have been reviewed.     Specialty medication(s) and dose(s) confirmed: Regimen is correct and unchanged.   Changes to medications: Johnny Evans reports no changes at this time.  Changes to insurance: No  New side effects reported not previously addressed with a pharmacist or physician: None reported  Questions for the pharmacist: No    Confirmed patient received a Conservation officer, historic buildings and a Surveyor, mining with first shipment. The patient will receive a drug information handout for each medication shipped and additional FDA Medication Guides as required.       DISEASE/MEDICATION-SPECIFIC INFORMATION        N/A    SPECIALTY MEDICATION ADHERENCE     Medication Adherence    Patient reported X missed doses in the last month: 0  Specialty Medication: predniSONE 5 MG tablet (DELTASONE)  Patient is on additional specialty medications: No  Adherence tools used: patient uses a pill box to manage medications              Were doses missed due to medication being on hold? No      predniSONE 5 MG tablet (DELTASONE): 4 days of medicine on hand       REFERRAL TO PHARMACIST     Referral to the pharmacist: Not needed      [facility]     Shipping address confirmed in Epic.       Delivery Scheduled: Yes, Expected medication delivery date: 05/11/2023.     Medication will be delivered via UPS to the temporary address in Epic WAM.    Johnny Evans   [facility] Specialty Technician

## 2023-05-10 DIAGNOSIS — Z940 Kidney transplant status: Principal | ICD-10-CM

## 2023-05-10 MED FILL — PREDNISONE 5 MG TABLET: ORAL | 30 days supply | Qty: 30 | Fill #2

## 2023-05-27 DIAGNOSIS — Z940 Kidney transplant status: Principal | ICD-10-CM

## 2023-05-27 MED ORDER — PROGRAF 1 MG CAPSULE
ORAL_CAPSULE | Freq: Two times a day (BID) | ORAL | 11 refills | 30 days
Start: 2023-05-27 — End: 2024-05-26

## 2023-05-27 NOTE — Unmapped (Signed)
30d temp expires 10.06.2024- mailing pt the app.

## 2023-05-27 NOTE — Unmapped (Signed)
Called patient to inform him that he is NOT eligible for the temp benefit. Checked notes in [employer] and pt was to apply for medicare in 2022, we have not gotten proof that he is not eligible. Mailing pt the letter with the toll free #.

## 2023-05-27 NOTE — Unmapped (Signed)
[facility] Refill Coordination Note    Specialty Medication(s) to be Shipped:   Transplant: Prograf 1mg     Other medication(s) to be shipped: No additional medications requested for fill at this time     Johnny Evans, DOB: December 02, 1975  Phone: [phone] (home)       All above HIPAA information was verified with patient.     Was a Nurse, learning disability used for this call? No    Completed refill call assessment today to schedule patient's medication shipment from the [facility] [phone]).  All relevant notes have been reviewed.     Specialty medication(s) and dose(s) confirmed: Regimen is correct and unchanged.   Changes to medications: Orest reports no changes at this time.  Changes to insurance: No  New side effects reported not previously addressed with a pharmacist or physician: None reported  Questions for the pharmacist: No    Confirmed patient received a Conservation officer, historic buildings and a Surveyor, mining with first shipment. The patient will receive a drug information handout for each medication shipped and additional FDA Medication Guides as required.       DISEASE/MEDICATION-SPECIFIC INFORMATION        N/A    SPECIALTY MEDICATION ADHERENCE     Medication Adherence    Patient reported X missed doses in the last month: 3-4  Specialty Medication: PROGRAF 1 MG capsule  Patient is on additional specialty medications: No  Adherence tools used: patient uses a pill box to manage medications              Were doses missed due to medication being on hold? No    Prograf 1 mg: 0 days of medicine on hand        REFERRAL TO PHARMACIST     Referral to the pharmacist: Not needed      [facility]     Shipping address confirmed in Epic.       Delivery Scheduled: Yes, Expected medication delivery date: 06/01/23.  However, Rx request for refills was sent to the provider as there are none remaining.     Medication will be delivered via UPS to the temporary address in Epic WAM.    Wil[facility] Specialty Technician

## 2023-05-30 DIAGNOSIS — Z940 Kidney transplant status: Principal | ICD-10-CM

## 2023-05-30 MED ORDER — PROGRAF 1 MG CAPSULE
ORAL_CAPSULE | Freq: Two times a day (BID) | ORAL | 11 refills | 30 days | Status: CP
Start: 2023-05-30 — End: 2024-05-29

## 2023-05-30 NOTE — Unmapped (Signed)
Pt request for RX Refill

## 2023-06-01 DIAGNOSIS — Z940 Kidney transplant status: Principal | ICD-10-CM

## 2023-06-01 MED ORDER — TACROLIMUS XR 1 MG TABLET,EXTENDED RELEASE 24 HR
ORAL_TABLET | Freq: Every day | ORAL | 11 refills | 30 days | Status: CP
Start: 2023-06-01 — End: ?

## 2023-06-03 DIAGNOSIS — Z940 Kidney transplant status: Principal | ICD-10-CM

## 2023-06-03 MED ORDER — TACROLIMUS 1 MG CAPSULE, IMMEDIATE-RELEASE
ORAL_CAPSULE | Freq: Two times a day (BID) | ORAL | 11 refills | 30 days | Status: CP
Start: 2023-06-03 — End: 2024-06-02

## 2023-06-03 NOTE — Unmapped (Signed)
Spoke with patient about Envarsus.  He has been out of medication for 3 days.  He acknowledges he still needs to get required paperwork to [employer] for [employer] assistance.  Discussed with patient importance and urgency that paperwork gets turned in today so he can continue to get this medication.   Prograf script sent to local [facility] per patient's request with advice to pick this up immediately so that patient will not miss any additional doses.

## 2023-06-07 NOTE — Unmapped (Signed)
[facility] Refill Coordination Note    Specialty Medication(s) to be Shipped:   Transplant: Prednisone 5mg     Other medication(s) to be shipped: No additional medications requested for fill at this time     Johnny Evans, DOB: 10/09/1975  Phone: [phone] (home)       All above HIPAA information was verified with patient.     Was a Nurse, learning disability used for this call? No    Completed refill call assessment today to schedule patient's medication shipment from the [facility]  [phone]).  All relevant notes have been reviewed.     Specialty medication(s) and dose(s) confirmed: Regimen is correct and unchanged.   Changes to medications: Eyoel reports no changes at this time.  Changes to insurance: No  New side effects reported not previously addressed with a pharmacist or physician: None reported  Questions for the pharmacist: No    Confirmed patient received a Conservation officer, historic buildings and a Surveyor, mining with first shipment. The patient will receive a drug information handout for each medication shipped and additional FDA Medication Guides as required.       DISEASE/MEDICATION-SPECIFIC INFORMATION        N/A    SPECIALTY MEDICATION ADHERENCE     Medication Adherence    Patient reported X missed doses in the last month: 0  Specialty Medication: predniSONE 5 MG tablet (DELTASONE)  Patient is on additional specialty medications: No  Adherence tools used: patient uses a pill box to manage medications              Were doses missed due to medication being on hold? No      predniSONE 5 MG tablet (DELTASONE): 3 days of medicine on hand       REFERRAL TO PHARMACIST     Referral to the pharmacist: Not needed      [facility]     Shipping address confirmed in Epic.       Delivery Scheduled: Yes, Expected medication delivery date: 06/10/2023.     Medication will be delivered via UPS to the temporary address in Epic WAM.    Manav Pierotti [facility]  Specialty Technician

## 2023-06-10 MED FILL — PREDNISONE 5 MG TABLET: ORAL | 30 days supply | Qty: 30 | Fill #3

## 2023-06-14 ENCOUNTER — Telehealth: Payer: Self-pay | Admitting: Cardiology

## 2023-06-14 NOTE — Telephone Encounter (Signed)
Spoke w/pt to verify mailing address due to returned mail & schedule f/u appt w/Dr. [place].  JB, 06-14-23

## 2023-06-19 DIAGNOSIS — Z940 Kidney transplant status: Principal | ICD-10-CM

## 2023-06-19 MED ORDER — TACROLIMUS 1 MG CAPSULE, IMMEDIATE-RELEASE
ORAL_CAPSULE | Freq: Two times a day (BID) | ORAL | 4 refills | 0 days
Start: 2023-06-19 — End: ?

## 2023-06-20 MED ORDER — TACROLIMUS 1 MG CAPSULE, IMMEDIATE-RELEASE
ORAL_CAPSULE | Freq: Two times a day (BID) | ORAL | 4 refills | 90 days | Status: CP
Start: 2023-06-20 — End: 2024-06-19

## 2023-07-04 DIAGNOSIS — Z940 Kidney transplant status: Principal | ICD-10-CM

## 2023-07-21 NOTE — Unmapped (Signed)
The [facility] has made a second and final attempt to reach this patient to refill the following medication:predniSONE 5 MG tablet (DELTASONE).      We have left voicemails on the following phone numbers: [phone], have sent a MyChart message, have sent a text message to the following phone numbers: [phone], and have sent a Mychart questionnaire..    Dates contacted: 10/16,10/31  Last scheduled delivery: 06/10/2023    The patient may be at risk of non-compliance with this medication. The patient should call the [facility] at [phone]  Option 4, then Option 4: Infectious Disease, Transplant to refill medication.    Jorje Guild Specialty and Home Delivery Oncologist

## 2023-07-26 MED FILL — PREDNISONE 5 MG TABLET: ORAL | 30 days supply | Qty: 30 | Fill #4
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
## 2023-08-01 ENCOUNTER — Ambulatory Visit: Payer: Self-pay | Attending: Cardiology | Admitting: Cardiology

## 2023-08-02 ENCOUNTER — Encounter: Payer: Self-pay | Admitting: Cardiology
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
## 2023-10-26 ENCOUNTER — Ambulatory Visit: Payer: Self-pay | Admitting: Cardiology

## 2023-12-13 ENCOUNTER — Other Ambulatory Visit: Payer: Self-pay | Admitting: Cardiology

## 2023-12-17 ENCOUNTER — Other Ambulatory Visit: Payer: Self-pay | Admitting: Cardiology

## 2024-01-23 DIAGNOSIS — R7989 Other specified abnormal findings of blood chemistry: Principal | ICD-10-CM

## 2024-01-23 DIAGNOSIS — Z940 Kidney transplant status: Principal | ICD-10-CM

## 2024-01-23 DIAGNOSIS — R82998 Other abnormal findings in urine: Principal | ICD-10-CM

## 2024-01-25 ENCOUNTER — Ambulatory Visit: Admit: 2024-01-25 | Discharge: 2024-01-26

## 2024-01-25 ENCOUNTER — Ambulatory Visit: Admit: 2024-01-25 | Discharge: 2024-01-26 | Attending: Nephrology | Primary: Nephrology

## 2024-01-25 DIAGNOSIS — I10 Essential (primary) hypertension: Principal | ICD-10-CM

## 2024-01-25 DIAGNOSIS — Z940 Kidney transplant status: Principal | ICD-10-CM

## 2024-01-25 DIAGNOSIS — Z79899 Other long term (current) drug therapy: Principal | ICD-10-CM

## 2024-01-25 DIAGNOSIS — R82998 Other abnormal findings in urine: Principal | ICD-10-CM

## 2024-01-25 DIAGNOSIS — R7989 Other specified abnormal findings of blood chemistry: Principal | ICD-10-CM

## 2024-01-25 DIAGNOSIS — E559 Vitamin D deficiency, unspecified: Principal | ICD-10-CM

## 2024-01-25 DIAGNOSIS — D849 Immunodeficiency, unspecified: Principal | ICD-10-CM

## 2024-01-25 DIAGNOSIS — F32A Depression, unspecified depression type: Principal | ICD-10-CM

## 2024-01-25 LAB — COMPREHENSIVE METABOLIC PANEL
ALBUMIN: 4.5 g/dL (ref 3.4–5.0)
ALKALINE PHOSPHATASE: 96 U/L (ref 46–116)
ALT (SGPT): 18 U/L (ref 10–49)
ANION GAP: 9 mmol/L (ref 5–14)
AST (SGOT): 16 U/L (ref <=34)
BILIRUBIN TOTAL: 0.9 mg/dL (ref 0.3–1.2)
BLOOD UREA NITROGEN: 13 mg/dL (ref 9–23)
BUN / CREAT RATIO: 15
CALCIUM: 10 mg/dL (ref 8.7–10.4)
CHLORIDE: 108 mmol/L — ABNORMAL HIGH (ref 98–107)
CO2: 26.3 mmol/L (ref 20.0–31.0)
CREATININE: 0.88 mg/dL (ref 0.73–1.18)
EGFR CKD-EPI (2021) MALE: >90 mL/min/1.73m2 (ref >=60)
GLUCOSE RANDOM: 110 mg/dL — ABNORMAL HIGH (ref 70–99)
POTASSIUM: 4.2 mmol/L (ref 3.4–4.8)
PROTEIN TOTAL: 7.6 g/dL (ref 5.7–8.2)
SODIUM: 143 mmol/L (ref 135–145)

## 2024-01-25 LAB — URINALYSIS WITH MICROSCOPY
BACTERIA: NONE SEEN /HPF
BILIRUBIN UA: NEGATIVE
BLOOD UA: NEGATIVE
GLUCOSE UA: NEGATIVE
KETONES UA: NEGATIVE
NITRITE UA: NEGATIVE
PH UA: 6 (ref 5.0–9.0)
PROTEIN UA: NEGATIVE
RBC UA: 1 /HPF (ref <=3)
SPECIFIC GRAVITY UA: 1.02 (ref 1.003–1.030)
SQUAMOUS EPITHELIAL: <1 /HPF (ref 0–5)
UROBILINOGEN UA: <2
WBC UA: 3 /HPF — ABNORMAL HIGH (ref <=2)

## 2024-01-25 LAB — MAGNESIUM: MAGNESIUM: 1.8 mg/dL (ref 1.6–2.6)

## 2024-01-25 LAB — BILIRUBIN, DIRECT: BILIRUBIN DIRECT: 0.3 mg/dL (ref 0.00–0.30)

## 2024-01-25 LAB — CBC W/ AUTO DIFF
BASOPHILS ABSOLUTE COUNT: 0 10*9/L (ref 0.0–0.1)
BASOPHILS RELATIVE PERCENT: 0.5 %
EOSINOPHILS ABSOLUTE COUNT: 0.3 10*9/L (ref 0.0–0.5)
EOSINOPHILS RELATIVE PERCENT: 2.9 %
HEMATOCRIT: 45.8 % (ref 39.0–48.0)
HEMOGLOBIN: 15.5 g/dL (ref 12.9–16.5)
LYMPHOCYTES ABSOLUTE COUNT: 1.9 10*9/L (ref 1.1–3.6)
LYMPHOCYTES RELATIVE PERCENT: 20.8 %
MEAN CORPUSCULAR HEMOGLOBIN CONC: 33.9 g/dL (ref 32.0–36.0)
MEAN CORPUSCULAR HEMOGLOBIN: 30.1 pg (ref 25.9–32.4)
MEAN CORPUSCULAR VOLUME: 88.7 fL (ref 77.6–95.7)
MEAN PLATELET VOLUME: 8.4 fL (ref 6.8–10.7)
MONOCYTES ABSOLUTE COUNT: 0.6 10*9/L (ref 0.3–0.8)
MONOCYTES RELATIVE PERCENT: 6 %
NEUTROPHILS ABSOLUTE COUNT: 6.4 10*9/L (ref 1.8–7.8)
NEUTROPHILS RELATIVE PERCENT: 69.8 %
PLATELET COUNT: 274 10*9/L (ref 150–450)
RED BLOOD CELL COUNT: 5.16 10*12/L (ref 4.26–5.60)
RED CELL DISTRIBUTION WIDTH: 14.4 % (ref 12.2–15.2)
WBC ADJUSTED: 9.2 10*9/L (ref 3.6–11.2)

## 2024-01-25 LAB — IRON & TIBC
IRON SATURATION: 23 % (ref 20–55)
IRON: 78 ug/dL (ref 65–175)
TOTAL IRON BINDING CAPACITY: 335 ug/dL (ref 250–425)

## 2024-01-25 LAB — PROTEIN / CREATININE RATIO, URINE
CREATININE, URINE: 164.3 mg/dL
PROTEIN URINE: 9.8 mg/dL
PROTEIN/CREAT RATIO, URINE: 0.06

## 2024-01-25 LAB — BK VIRUS QUANTITATIVE PCR, BLOOD: BK BLOOD RESULT: NOT DETECTED

## 2024-01-25 LAB — TACROLIMUS LEVEL, TROUGH: TACROLIMUS, TROUGH: 8.3 ng/mL (ref 5.0–15.0)

## 2024-01-25 LAB — LIPID PANEL
CHOLESTEROL: 132 mg/dL (ref <200)
HDL CHOLESTEROL: 39 mg/dL — ABNORMAL LOW (ref >40)
LDL CHOLESTEROL CALCULATED: 83 mg/dL (ref <100)
NON-HDL CHOLESTEROL: 93 mg/dL (ref <130)
TRIGLYCERIDES: 67 mg/dL (ref <150)

## 2024-01-25 LAB — PHOSPHORUS: PHOSPHORUS: 2.9 mg/dL (ref 2.4–5.1)

## 2024-01-25 LAB — HEMOGLOBIN A1C
ESTIMATED AVERAGE GLUCOSE: 120 mg/dL
HEMOGLOBIN A1C: 5.8 % — ABNORMAL HIGH (ref 4.8–5.6)

## 2024-01-25 LAB — CMV DNA, QUANTITATIVE, PCR: CMV VIRAL LD: NOT DETECTED

## 2024-01-25 LAB — FERRITIN: FERRITIN: 165.8 ng/mL (ref 10.5–307.3)

## 2024-01-25 MED ORDER — SERTRALINE 100 MG TABLET
ORAL_TABLET | Freq: Every day | ORAL | 3 refills | 100.00000 days | Status: CP
Start: 2024-01-25 — End: ?

## 2024-01-25 MED ORDER — PREDNISONE 5 MG TABLET
ORAL_TABLET | Freq: Every day | ORAL | 3 refills | 100.00000 days | Status: CP
Start: 2024-01-25 — End: 2025-02-28

## 2024-01-25 NOTE — Unmapped (Unsigned)
 Transplant Coordinator, Clinic Visit   Pt seen today by transplant nephrology for follow up, reviewed medications and symptoms.          01/25/24 0809   BP: 119/84   Pulse: 56   Temp: 35.8 ??C (96.5 ??F)   Weight: 100.2 kg (221 lb)   PainSc: 0-No pain       Assessment  BP: normal BP at home when checked    Labs today    Feels well overall, no complaints of swelling, shortness of breath, decreased urination    Sleep: waking up at 3 am with urge to urinate    Pain: none      Good appetite; reports adequate hydration.     Intake: 2 liters of fluid in  Output: adequate    Any new medications? no  Immunosuppressant last taken: 9pm yesterday, Envarsus    Functional Score: 100   Normal no complaints; no evidence of  disease.   Employment status is: other:   working, hours unknown    I spent a total of 20 minutes with Johnny Evans reviewing medications and symptoms.

## 2024-01-28 LAB — VITAMIN D 25 HYDROXY: VITAMIN D, TOTAL (25OH): 21.3 ng/mL (ref 20.0–80.0)

## 2024-01-30 LAB — VITAMIN D 1,25 DIHYDROXY: VITAMIN D 1,25-DIHYDROXY: 53 pg/mL

## 2024-01-31 LAB — HLA DS POST TRANSPLANT
ANTI-DONOR DRW #1 MFI: 124 MFI
ANTI-DONOR DRW #2 MFI: 101 MFI
ANTI-DONOR HLA-A #1 MFI: 0 MFI
ANTI-DONOR HLA-A #2 MFI: 0 MFI
ANTI-DONOR HLA-B #1 MFI: 0 MFI
ANTI-DONOR HLA-B #2 MFI: 0 MFI
ANTI-DONOR HLA-C #1 MFI: 0 MFI
ANTI-DONOR HLA-C #2 MFI: 0 MFI
ANTI-DONOR HLA-DP AG #1 MFI: 119 MFI
ANTI-DONOR HLA-DQB #1 MFI: 9 MFI
ANTI-DONOR HLA-DQB #2 MFI: 360 MFI
ANTI-DONOR HLA-DR #1 MFI: 120 MFI
ANTI-DONOR HLA-DR #2 MFI: 252 MFI

## 2024-01-31 LAB — FSAB CLASS 2 ANTIBODY SPECIFICITY
CLASS 2 ANTIBODIES IDENTIFIED: 1:3 {titer}
HLA CL2 AB RESULT: POSITIVE

## 2024-01-31 LAB — FSAB CLASS 1 ANTIBODY SPECIFICITY: HLA CLASS 1 ANTIBODY RESULT: POSITIVE

## 2024-02-01 NOTE — Unmapped (Signed)
 Specialty Medication(s): no specialty medications    Johnny Evans has been dis-enrolled from the [employer] [facility] specialty pharmacy services as a result of multiple unsuccessful outreach attempts by the pharmacy.    Additional information provided to the patient: per maps notes envarsus comes from [employer], patient was only getting prednisone at [facility] but last filled prednisone over 6 months ago at [facility] (07/26/2023). [facility] team unable to reach patient since last prednisone fill (inability to reach patient message sent to clinic by TDR on 07/06/23). As no medications filled in over 6 months, patient disenrolled per protocol, clinic aware.    Christine Cozier, PharmD  [facility] Specialty Pharmacist
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
## 2024-02-06 ENCOUNTER — Ambulatory Visit: Payer: Self-pay | Attending: Cardiology | Admitting: Cardiology

## 2024-02-07 ENCOUNTER — Encounter: Payer: Self-pay | Admitting: Cardiology

## 2024-05-11 ENCOUNTER — Other Ambulatory Visit: Payer: Self-pay | Admitting: Cardiology

## 2024-06-07 DIAGNOSIS — Z940 Kidney transplant status: Principal | ICD-10-CM

## 2024-06-07 MED ORDER — TACROLIMUS XR 1 MG TABLET,EXTENDED RELEASE 24 HR
ORAL_TABLET | Freq: Every day | ORAL | 11 refills | 30.00000 days | Status: CP
Start: 2024-06-07 — End: ?

## 2024-06-08 DIAGNOSIS — Z940 Kidney transplant status: Principal | ICD-10-CM

## 2024-06-27 DIAGNOSIS — Z940 Kidney transplant status: Principal | ICD-10-CM

## 2024-06-27 DIAGNOSIS — D849 Immunodeficiency, unspecified: Principal | ICD-10-CM

## 2024-07-12 DIAGNOSIS — Z940 Kidney transplant status: Principal | ICD-10-CM

## 2024-08-10 ENCOUNTER — Other Ambulatory Visit: Payer: Self-pay | Admitting: Cardiology

## 2024-08-13 DIAGNOSIS — D849 Immunodeficiency, unspecified: Principal | ICD-10-CM

## 2024-08-13 DIAGNOSIS — Z940 Kidney transplant status: Principal | ICD-10-CM
